# Patient Record
Sex: Female | Born: 1975
Health system: Southern US, Community
[De-identification: ages and names within clinical notes are randomized; demographics above are authoritative.]

## PROBLEM LIST (undated history)

## (undated) DIAGNOSIS — T8859XA Other complications of anesthesia, initial encounter: Secondary | ICD-10-CM

## (undated) DIAGNOSIS — T4145XA Adverse effect of unspecified anesthetic, initial encounter: Secondary | ICD-10-CM

## (undated) DIAGNOSIS — R109 Unspecified abdominal pain: Secondary | ICD-10-CM

## (undated) DIAGNOSIS — Z9889 Other specified postprocedural states: Secondary | ICD-10-CM

## (undated) DIAGNOSIS — R319 Hematuria, unspecified: Secondary | ICD-10-CM

## (undated) DIAGNOSIS — R519 Headache, unspecified: Secondary | ICD-10-CM

## (undated) DIAGNOSIS — F419 Anxiety disorder, unspecified: Secondary | ICD-10-CM

## (undated) DIAGNOSIS — R112 Nausea with vomiting, unspecified: Secondary | ICD-10-CM

## (undated) DIAGNOSIS — R51 Headache: Secondary | ICD-10-CM

## (undated) DIAGNOSIS — R6883 Chills (without fever): Secondary | ICD-10-CM

## (undated) DIAGNOSIS — H539 Unspecified visual disturbance: Secondary | ICD-10-CM

## (undated) DIAGNOSIS — R110 Nausea: Secondary | ICD-10-CM

## (undated) DIAGNOSIS — R002 Palpitations: Secondary | ICD-10-CM

## (undated) DIAGNOSIS — D689 Coagulation defect, unspecified: Secondary | ICD-10-CM

## (undated) DIAGNOSIS — K8020 Calculus of gallbladder without cholecystitis without obstruction: Secondary | ICD-10-CM

## (undated) DIAGNOSIS — D329 Benign neoplasm of meninges, unspecified: Secondary | ICD-10-CM

## (undated) DIAGNOSIS — R569 Unspecified convulsions: Secondary | ICD-10-CM

## (undated) DIAGNOSIS — F32A Depression, unspecified: Secondary | ICD-10-CM

## (undated) DIAGNOSIS — I749 Embolism and thrombosis of unspecified artery: Secondary | ICD-10-CM

## (undated) DIAGNOSIS — R634 Abnormal weight loss: Secondary | ICD-10-CM

## (undated) DIAGNOSIS — F329 Major depressive disorder, single episode, unspecified: Secondary | ICD-10-CM

## (undated) DIAGNOSIS — G459 Transient cerebral ischemic attack, unspecified: Secondary | ICD-10-CM

## (undated) DIAGNOSIS — K625 Hemorrhage of anus and rectum: Secondary | ICD-10-CM

## (undated) DIAGNOSIS — D496 Neoplasm of unspecified behavior of brain: Secondary | ICD-10-CM

## (undated) DIAGNOSIS — R410 Disorientation, unspecified: Secondary | ICD-10-CM

## (undated) DIAGNOSIS — K6289 Other specified diseases of anus and rectum: Secondary | ICD-10-CM

## (undated) DIAGNOSIS — J45909 Unspecified asthma, uncomplicated: Secondary | ICD-10-CM

## (undated) DIAGNOSIS — R531 Weakness: Secondary | ICD-10-CM

## (undated) HISTORY — DX: Hematuria, unspecified: R31.9

## (undated) HISTORY — DX: Chills (without fever): R68.83

## (undated) HISTORY — DX: Benign neoplasm of meninges, unspecified: D32.9

## (undated) HISTORY — DX: Abnormal weight loss: R63.4

## (undated) HISTORY — DX: Unspecified asthma, uncomplicated: J45.909

## (undated) HISTORY — DX: Unspecified abdominal pain: R10.9

## (undated) HISTORY — DX: Nausea: R11.0

## (undated) HISTORY — DX: Calculus of gallbladder without cholecystitis without obstruction: K80.20

## (undated) HISTORY — PX: BRAIN SURGERY: SHX531

## (undated) HISTORY — DX: Coagulation defect, unspecified: D68.9

## (undated) HISTORY — DX: Palpitations: R00.2

## (undated) HISTORY — DX: Weakness: R53.1

## (undated) HISTORY — DX: Hemorrhage of anus and rectum: K62.5

## (undated) HISTORY — DX: Unspecified visual disturbance: H53.9

## (undated) HISTORY — DX: Other specified diseases of anus and rectum: K62.89

## (undated) HISTORY — DX: Disorientation, unspecified: R41.0

---

## 2001-04-07 ENCOUNTER — Inpatient Hospital Stay (HOSPITAL_COMMUNITY): Admission: EM | Admit: 2001-04-07 | Discharge: 2001-04-08 | Payer: Self-pay | Admitting: Podiatry

## 2001-04-07 ENCOUNTER — Encounter: Payer: Self-pay | Admitting: *Deleted

## 2006-08-12 ENCOUNTER — Emergency Department (HOSPITAL_COMMUNITY): Admission: EM | Admit: 2006-08-12 | Discharge: 2006-08-12 | Payer: Self-pay | Admitting: Emergency Medicine

## 2006-08-24 ENCOUNTER — Emergency Department (HOSPITAL_COMMUNITY): Admission: EM | Admit: 2006-08-24 | Discharge: 2006-08-24 | Payer: Self-pay | Admitting: Emergency Medicine

## 2008-03-08 ENCOUNTER — Emergency Department (HOSPITAL_COMMUNITY): Admission: EM | Admit: 2008-03-08 | Discharge: 2008-03-09 | Payer: Self-pay | Admitting: Emergency Medicine

## 2008-03-12 HISTORY — PX: OTHER SURGICAL HISTORY: SHX169

## 2008-03-18 ENCOUNTER — Ambulatory Visit: Admission: RE | Admit: 2008-03-18 | Discharge: 2008-03-18 | Payer: Self-pay | Admitting: Gynecologic Oncology

## 2008-03-18 ENCOUNTER — Encounter: Payer: Self-pay | Admitting: Gynecologic Oncology

## 2008-03-18 ENCOUNTER — Ambulatory Visit (HOSPITAL_COMMUNITY): Admission: RE | Admit: 2008-03-18 | Discharge: 2008-03-18 | Payer: Self-pay | Admitting: Gynecologic Oncology

## 2008-03-18 ENCOUNTER — Other Ambulatory Visit: Admission: RE | Admit: 2008-03-18 | Discharge: 2008-03-18 | Payer: Self-pay | Admitting: Gynecologic Oncology

## 2008-03-30 ENCOUNTER — Encounter: Payer: Self-pay | Admitting: Gynecologic Oncology

## 2008-03-30 ENCOUNTER — Inpatient Hospital Stay (HOSPITAL_COMMUNITY): Admission: RE | Admit: 2008-03-30 | Discharge: 2008-04-01 | Payer: Self-pay | Admitting: Obstetrics & Gynecology

## 2009-03-12 DIAGNOSIS — D496 Neoplasm of unspecified behavior of brain: Secondary | ICD-10-CM

## 2009-03-12 HISTORY — DX: Neoplasm of unspecified behavior of brain: D49.6

## 2009-03-12 HISTORY — PX: OTHER SURGICAL HISTORY: SHX169

## 2009-03-15 ENCOUNTER — Emergency Department (HOSPITAL_COMMUNITY): Admission: EM | Admit: 2009-03-15 | Discharge: 2009-03-15 | Payer: Self-pay | Admitting: Emergency Medicine

## 2009-03-25 ENCOUNTER — Ambulatory Visit (HOSPITAL_COMMUNITY): Admission: RE | Admit: 2009-03-25 | Discharge: 2009-03-25 | Payer: Self-pay | Admitting: Neurosurgery

## 2009-03-31 ENCOUNTER — Inpatient Hospital Stay (HOSPITAL_COMMUNITY): Admission: RE | Admit: 2009-03-31 | Discharge: 2009-04-04 | Payer: Self-pay | Admitting: Neurosurgery

## 2009-03-31 ENCOUNTER — Encounter (INDEPENDENT_AMBULATORY_CARE_PROVIDER_SITE_OTHER): Payer: Self-pay | Admitting: Neurosurgery

## 2009-04-10 ENCOUNTER — Emergency Department (HOSPITAL_COMMUNITY): Admission: EM | Admit: 2009-04-10 | Discharge: 2009-04-11 | Payer: Self-pay | Admitting: Emergency Medicine

## 2009-07-13 ENCOUNTER — Ambulatory Visit (HOSPITAL_COMMUNITY): Admission: RE | Admit: 2009-07-13 | Discharge: 2009-07-13 | Payer: Self-pay | Admitting: Neurosurgery

## 2010-05-28 LAB — BASIC METABOLIC PANEL
BUN: 2 mg/dL — ABNORMAL LOW (ref 6–23)
BUN: 6 mg/dL (ref 6–23)
CO2: 24 mEq/L (ref 19–32)
CO2: 18 mEq/L — ABNORMAL LOW (ref 19–32)
CO2: 22 mEq/L (ref 19–32)
Calcium: 7.9 mg/dL — ABNORMAL LOW (ref 8.4–10.5)
Calcium: 8.1 mg/dL — ABNORMAL LOW (ref 8.4–10.5)
Chloride: 105 mEq/L (ref 96–112)
Chloride: 106 mEq/L (ref 96–112)
Chloride: 117 mEq/L — ABNORMAL HIGH (ref 96–112)
Creatinine, Ser: 0.51 mg/dL (ref 0.4–1.2)
Creatinine, Ser: 0.58 mg/dL (ref 0.4–1.2)
GFR calc non Af Amer: >60 mL/min (ref >60)
GFR calc non Af Amer: >60 mL/min (ref >60)
Glucose, Bld: 97 mg/dL (ref 70–99)
Glucose, Bld: 100 mg/dL — ABNORMAL HIGH (ref 70–99)
Glucose, Bld: 118 mg/dL — ABNORMAL HIGH (ref 70–99)
Potassium: 3.5 mEq/L (ref 3.5–5.1)
Potassium: 3.5 mEq/L (ref 3.5–5.1)
Potassium: 3.9 mEq/L (ref 3.5–5.1)
Sodium: 136 mEq/L (ref 135–145)
Sodium: 135 mEq/L (ref 135–145)
Sodium: 140 mEq/L (ref 135–145)

## 2010-05-28 LAB — CBC
HCT: 36.7 % (ref 36.0–46.0)
HCT: 40.7 % (ref 36.0–46.0)
Hemoglobin: 12.5 g/dL (ref 12.0–15.0)
Hemoglobin: 13.9 g/dL (ref 12.0–15.0)
MCHC: 34.1 g/dL (ref 30.0–36.0)
MCHC: 34.1 g/dL (ref 30.0–36.0)
MCV: 85.6 fL (ref 78.0–100.0)
MCV: 85.8 fL (ref 78.0–100.0)
Platelets: 277 10*3/uL (ref 150–400)
RBC: 4.29 MIL/uL (ref 3.87–5.11)
RBC: 4.74 MIL/uL (ref 3.87–5.11)
RDW: 14 % (ref 11.5–15.5)
WBC: 8.5 10*3/uL (ref 4.0–10.5)

## 2010-05-28 LAB — URINALYSIS, ROUTINE W REFLEX MICROSCOPIC
Nitrite: NEGATIVE
Specific Gravity, Urine: 1.027 (ref 1.005–1.030)
pH: 7 (ref 5.0–8.0)

## 2010-05-28 LAB — DIFFERENTIAL
Basophils Relative: 0 % (ref 0–1)
Eosinophils Absolute: 0.1 10*3/uL (ref 0.0–0.7)
Eosinophils Relative: 1 % (ref 0–5)
Monocytes Absolute: 0.5 10*3/uL (ref 0.1–1.0)
Monocytes Relative: 7 % (ref 3–12)
Neutro Abs: 5.7 10*3/uL (ref 1.7–7.7)

## 2010-05-28 LAB — TYPE AND SCREEN
ABO/RH(D): A POS
Antibody Screen: NEGATIVE

## 2010-05-28 LAB — PHENYTOIN LEVEL, TOTAL
Phenytoin Lvl: 13.8 ug/mL (ref 10.0–20.0)
Phenytoin Lvl: 9 ug/mL — ABNORMAL LOW (ref 10.0–20.0)
Phenytoin Lvl: 9.9 ug/mL — ABNORMAL LOW (ref 10.0–20.0)

## 2010-05-28 LAB — MRSA PCR SCREENING

## 2010-05-28 LAB — ABO/RH: ABO/RH(D): A POS

## 2010-06-26 LAB — DIFFERENTIAL
Eosinophils Absolute: 0.1 10*3/uL (ref 0.0–0.7)
Eosinophils Relative: 2 % (ref 0–5)
Lymphs Abs: 1.5 10*3/uL (ref 0.7–4.0)
Monocytes Absolute: 0.4 10*3/uL (ref 0.1–1.0)

## 2010-06-26 LAB — CBC
HCT: 30.8 % — ABNORMAL LOW (ref 36.0–46.0)
Hemoglobin: 10.5 g/dL — ABNORMAL LOW (ref 12.0–15.0)
RBC: 4.52 MIL/uL (ref 3.87–5.11)
RBC: 3.64 MIL/uL — ABNORMAL LOW (ref 3.87–5.11)
RDW: 14.1 % (ref 11.5–15.5)
WBC: 5.3 10*3/uL (ref 4.0–10.5)

## 2010-06-26 LAB — COMPREHENSIVE METABOLIC PANEL
ALT: 28 U/L (ref 0–35)
AST: 29 U/L (ref 0–37)
Alkaline Phosphatase: 44 U/L (ref 39–117)
CO2: 25 mEq/L (ref 19–32)
GFR calc non Af Amer: >60 mL/min (ref >60)
Glucose, Bld: 59 mg/dL — ABNORMAL LOW (ref 70–99)
Potassium: 3.9 mEq/L (ref 3.5–5.1)
Sodium: 139 mEq/L (ref 135–145)
Total Protein: 6 g/dL (ref 6.0–8.3)

## 2010-06-26 LAB — ABO/RH: ABO/RH(D): A POS

## 2010-06-26 LAB — PREGNANCY, URINE

## 2010-07-25 NOTE — Consult Note (Signed)
NAMEANTONIETA, [name] NO.:  [id number]   MEDICAL RECORD NO.:  [id number]          PATIENT TYPE:  OUT   LOCATION:  GYN                          FACILITY:  [facility]   PHYSICIAN:  Laurette Schimke, MD     DATE OF BIRTH:  May 10, 1975   DATE OF CONSULTATION:  03/18/2008  DATE OF DISCHARGE:                                 CONSULTATION   REASON FOR CONSULTATION:  Evaluation of pelvic mass.   HISTORY OF PRESENT ILLNESS:  This is a 35 year old gravida 1, para 1,  last menstrual period 03/08/2008, who presents with pelvic pain and  sensation of a hard mass within the pelvis.  She reports premenstrual  discomfort over the last year, which has worsened markedly over the past  few months.  She was seen in the emergency room on 03/09/2008 on day 2  of her menses with very severe abdominal pain and nausea.  A CT scan of  the abdomen and pelvis was obtained and was notable for a large cystic  and solid pelvic mass present inseparable from dorsal  surface of the  uterus.  The pelvic mass measured 12.6 x 10.4 x 12.6.  She was  subsequently seen by Dr. Billy Coast, the referring doctor, and the  ultrasound was performed and was notable for a mass measuring 9 x 12 x  11 cm, mainly hemorrhagic with some echo free  areas.  A CA-125 was  obtained and returned with a value of 130.7.  CEA was obtained and  returned a value of 0.6.  Of note, Melanie [name] states that she has  never been given a diagnosis of endometriosis or endometrioma, but  reports dyspareunia for approximately 2 years duration.   PAST MEDICAL HISTORY:  Asthma as a child with use of inhalers as a  teenager.  She denies any history of intubation or use of an inhaler as  an adult.   PAST SURGICAL HISTORY:  None.   PAST GYN HISTORY:  Menarche at the age of 8 with menses every month  lasting 3 days.  She has had 1 normal spontaneous vaginal delivery.   FAMILY HISTORY:  Notable for a half-sister diagnosed with cervical  cancer  at the age of 26.  She denies any colon or breast cancer.   SOCIAL HISTORY:  Melanie [name] is in a longstanding relationship.  She  is desirous of maintaining her fertility.  She has a 52 year old son who  is alive and well.  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  She denies any tobacco or IV  drug abuse.   REVIEW OF SYSTEMS:  Notable for cough of approximately 3 days duration  productive of yellow sputum and lower abdominal pain with occasional  nausea.   PHYSICAL EXAMINATION:  GENERAL:  Well-developed female in no acute  distress.  VITAL SIGNS:  Height 5 feet 1 inch, weight 167 pounds, blood pressure  138/84, pulse 72, pain 1/10.  CHEST:  Wheezes notable in the left lower lobe.  PELVIC EXAMINATION:  Notable for approximately a  14 cm fixed, tender  right-sided mass.  No nodularity is notable in the cul-de-sac.  The size  of the uterus is not identifiable on examination.  RECTAL:  Good sphincter tone without any masses.   IMPRESSION:  1. Pelvic mass, elevated CA-125.  Differential diagnosis is      endometriosis with associated endometrioma versus a neoplasm.  The      CT scan is notable for the presence of solid nodules within this      mass.  As such, an exploratory laparotomy has been recommended to      the patient with possible cystectomy versus unilateral salpingo-      oophorectomy.  She is desirous of staging and was informed that      based on histologic type and the visible evidence of extension of      disease, staging could be limited or could encompass hysterectomy      and bilateral salpingo-oophorectomy.  Risks of the procedure were      discussed with the patient and were inclusive of infection,      bleeding, damage to surrounding structures, prolonged surgery,      reoperation.  2. Productive cough.  A chest x-ray has been ordered.  She has been      placed on a Z-Pak.  3. Pain.  Oxycodone was prescribed  for pain control.      Laurette Schimke, MD  Electronically Signed     WB/MEDQ  D:  03/18/2008  T:  03/18/2008  Job:  [id number]   cc:   Telford Nab, R.N.  [address]  [place], [place] [address]   Lenoard Aden, M.D.  Fax: [phone]

## 2010-07-25 NOTE — Op Note (Signed)
Melanie [name], PAVLAK            ACCOUNT NO.:  [id number]   MEDICAL RECORD NO.:  [id number]          PATIENT TYPE:  INP   LOCATION:  0003                         FACILITY:  [facility]   PHYSICIAN:  Laurette Schimke, MD     DATE OF BIRTH:  [birth year]   DATE OF PROCEDURE:  03/30/2008  DATE OF DISCHARGE:                               OPERATIVE REPORT   PREOPERATIVE DIAGNOSIS:  Pelvic mass.   POSTOPERATIVE DIAGNOSIS:  Right ovarian endometrioma.   OPERATION PERFORMED:  Exploratory laparotomy, right ovarian cystectomy.   SURGEON:  Laurette Schimke, MD   ANESTHESIA:  General endotracheal.   INDICATIONS FOR PROCEDURE:  This is a 35 year old who presented with a  13 cm pelvic mass, notable for a severe amount of pain and discomfort.  The patient was counseled regarding the risks and benefits of surgery  and indicated that she still wanted to preserve her childbearing.  She  was counseled regarding making the procedure be cystectomy versus  unilateral salpingo-oophorectomy versus surgical staging in the event of  malignancy.  She consented.   FINDINGS:  Upon entry into the abdomen, a 15 cm ovarian endometrioma was  appreciated.  There was no gross disease on the other intra-abdominal  and intrapelvic structures.  There was evidence of excrescences on the  surface of the mass and this was removed with cautery.  A cystectomy was  easily performed with removal or the entire cyst wall.  There was normal  appendix, normal kidneys bilaterally, normal gallbladder, liver,  pancreas.   DESCRIPTION OF PROCEDURE:  The patient was taken to the operating room  and placed under general endotracheal anesthesia without any difficulty.  An 8 cm vertical incision was made below the umbilicus with a scalpel  and the abdomen entered.  Pelvic washings were obtained.  The upper  abdomen and pelvis were explored and the findings were as noted above.  Laps were placed within the abdomen and a pursestring suture was  placed  in the ovary and the ovarian endometrioma drained of 700 mL of  endometriotic chocolate fluid.  An incision was then made in the cyst  and the cyst wall very easily removed.  Some powder burns were noted on  the external surface of the ovarian capsule and these were ablated using  cautery.  The pelvis was copiously irrigated and drained.  The appendix  was identified and was normal.  The contralateral ovary was evaluated  and was within normal limits.  The laps were removed.  The abdomen again  irrigated and drained.  The fascia was then closed with a 0 looped PDS  suture in a Smead Jones fashion.  Subcutaneous tissues were irrigated  and the subcutaneous tissues approximated with interrupted Vicryl  sutures.  The skin was closed with a subcuticular suture.  Benzoin was  placed over the skin.  Of note, 10 mL of Marcaine were injected into  incision.  Steri-Strips were placed over the incision.  The estimated  blood loss was 50 mL.  Sponge, instrument and needle counts were correct  x3.   COMPLICATIONS:  None.   DRAINS:  Foley draining  clear urine.   SPECIMENS:  Right ovarian endometriotic cyst.   The patient was transferred to the PACU in good condition.      Laurette Schimke, MD  Electronically Signed     WB/MEDQ  D:  03/30/2008  T:  03/30/2008  Job:  [id number]   cc:   Telford Nab, R.N.  [address]  [place], [place] [address]   Lenoard Aden, M.D.  Fax: [phone]   Roseanna Rainbow, M.D.  Fax: [phone]

## 2010-12-05 ENCOUNTER — Other Ambulatory Visit (HOSPITAL_COMMUNITY): Payer: Self-pay | Admitting: Neurosurgery

## 2010-12-05 DIAGNOSIS — D429 Neoplasm of uncertain behavior of meninges, unspecified: Secondary | ICD-10-CM

## 2010-12-14 LAB — CBC
Hemoglobin: 12.9 g/dL (ref 12.0–15.0)
MCHC: 33.6 g/dL (ref 30.0–36.0)
RBC: 4.61 MIL/uL (ref 3.87–5.11)

## 2010-12-14 LAB — URINALYSIS, ROUTINE W REFLEX MICROSCOPIC
Bilirubin Urine: NEGATIVE
Glucose, UA: NEGATIVE mg/dL
Ketones, ur: NEGATIVE mg/dL
Protein, ur: NEGATIVE mg/dL
pH: 5.5 (ref 5.0–8.0)

## 2010-12-14 LAB — DIFFERENTIAL
Basophils Relative: 1 % (ref 0–1)
Eosinophils Absolute: 0.2 10*3/uL (ref 0.0–0.7)
Eosinophils Relative: 3 % (ref 0–5)
Lymphs Abs: 1.3 10*3/uL (ref 0.7–4.0)
Monocytes Relative: 7 % (ref 3–12)

## 2010-12-14 LAB — COMPREHENSIVE METABOLIC PANEL
ALT: 30 U/L (ref 0–35)
AST: 24 U/L (ref 0–37)
Alkaline Phosphatase: 60 U/L (ref 39–117)
CO2: 26 mEq/L (ref 19–32)
Calcium: 9 mg/dL (ref 8.4–10.5)
GFR calc Af Amer: >60 mL/min (ref >60)
Potassium: 3.8 mEq/L (ref 3.5–5.1)
Sodium: 140 mEq/L (ref 135–145)
Total Protein: 6.2 g/dL (ref 6.0–8.3)

## 2010-12-14 LAB — URINE MICROSCOPIC-ADD ON

## 2010-12-14 LAB — URINE CULTURE

## 2010-12-14 LAB — WET PREP, GENITAL
Trich, Wet Prep: NONE SEEN
Yeast Wet Prep HPF POC: NONE SEEN

## 2010-12-14 LAB — POCT PREGNANCY, URINE: Preg Test, Ur: NEGATIVE

## 2010-12-26 ENCOUNTER — Inpatient Hospital Stay (HOSPITAL_COMMUNITY): Admission: RE | Admit: 2010-12-26 | Payer: Self-pay | Source: Ambulatory Visit

## 2010-12-28 LAB — COMPREHENSIVE METABOLIC PANEL
ALT: 16
BUN: 8
CO2: 26
Calcium: 8.8
Creatinine, Ser: 0.64
GFR calc non Af Amer: >60
Glucose, Bld: 100 — ABNORMAL HIGH
Sodium: 140

## 2010-12-28 LAB — CBC
HCT: 39.6
Hemoglobin: 13.4
MCHC: 34
MCV: 84.3
Platelets: 254
RBC: 4.69
RDW: 14
WBC: 5.9

## 2010-12-28 LAB — COMPREHENSIVE METABOLIC PANEL WITH GFR
AST: 24
Albumin: 3.6
Alkaline Phosphatase: 45
Chloride: 107
GFR calc Af Amer: >60
Potassium: 3.9
Total Bilirubin: 0.7
Total Protein: 6.5

## 2010-12-28 LAB — DIFFERENTIAL
Basophils Absolute: 0
Basophils Relative: 0
Eosinophils Absolute: 0
Eosinophils Relative: 1
Lymphocytes Relative: 13
Lymphs Abs: 0.8
Monocytes Absolute: 0.2
Monocytes Relative: 4
Neutro Abs: 4.9
Neutrophils Relative %: 82 — ABNORMAL HIGH

## 2011-01-16 ENCOUNTER — Ambulatory Visit (HOSPITAL_COMMUNITY)
Admission: RE | Admit: 2011-01-16 | Discharge: 2011-01-16 | Disposition: A | Discharge status: Home / Self Care | Payer: Medicaid Other | Source: Ambulatory Visit | Attending: Neurosurgery | Admitting: Neurosurgery

## 2011-01-16 DIAGNOSIS — D429 Neoplasm of uncertain behavior of meninges, unspecified: Secondary | ICD-10-CM

## 2011-01-16 DIAGNOSIS — Z87898 Personal history of other specified conditions: Secondary | ICD-10-CM | POA: Insufficient documentation

## 2011-01-16 MED ORDER — GADOBENATE DIMEGLUMINE 529 MG/ML IV SOLN
15.0000 mL | Freq: Once | INTRAVENOUS | Status: AC
  Administered 2011-01-16: 15 mL via INTRAVENOUS

## 2011-07-06 ENCOUNTER — Emergency Department (HOSPITAL_COMMUNITY)
Admission: EM | Admit: 2011-07-06 | Discharge: 2011-07-06 | Disposition: A | Discharge status: Home / Self Care | Payer: Self-pay | Attending: Emergency Medicine | Admitting: Emergency Medicine

## 2011-07-06 ENCOUNTER — Emergency Department (HOSPITAL_COMMUNITY): Payer: Self-pay

## 2011-07-06 ENCOUNTER — Encounter (HOSPITAL_COMMUNITY): Payer: Self-pay

## 2011-07-06 DIAGNOSIS — Z8679 Personal history of other diseases of the circulatory system: Secondary | ICD-10-CM | POA: Insufficient documentation

## 2011-07-06 DIAGNOSIS — R4781 Slurred speech: Secondary | ICD-10-CM

## 2011-07-06 DIAGNOSIS — R209 Unspecified disturbances of skin sensation: Secondary | ICD-10-CM | POA: Insufficient documentation

## 2011-07-06 DIAGNOSIS — R42 Dizziness and giddiness: Secondary | ICD-10-CM | POA: Insufficient documentation

## 2011-07-06 DIAGNOSIS — R4789 Other speech disturbances: Secondary | ICD-10-CM | POA: Insufficient documentation

## 2011-07-06 DIAGNOSIS — R202 Paresthesia of skin: Secondary | ICD-10-CM

## 2011-07-06 DIAGNOSIS — R51 Headache: Secondary | ICD-10-CM | POA: Insufficient documentation

## 2011-07-06 HISTORY — DX: Unspecified convulsions: R56.9

## 2011-07-06 HISTORY — DX: Neoplasm of unspecified behavior of brain: D49.6

## 2011-07-06 HISTORY — DX: Embolism and thrombosis of unspecified artery: I74.9

## 2011-07-06 HISTORY — DX: Transient cerebral ischemic attack, unspecified: G45.9

## 2011-07-06 LAB — BASIC METABOLIC PANEL
BUN: 8 mg/dL (ref 6–23)
Chloride: 104 mEq/L (ref 96–112)
GFR calc Af Amer: >90 mL/min (ref >90)
GFR calc non Af Amer: >90 mL/min (ref >90)
Potassium: 3.2 mEq/L — ABNORMAL LOW (ref 3.5–5.1)
Sodium: 136 mEq/L (ref 135–145)

## 2011-07-06 LAB — CBC
Hemoglobin: 12.7 g/dL (ref 12.0–15.0)
MCH: 27.5 pg (ref 26.0–34.0)
MCHC: 34.3 g/dL (ref 30.0–36.0)
Platelets: 257 10*3/uL (ref 150–400)
RDW: 13.2 % (ref 11.5–15.5)

## 2011-07-06 LAB — DIFFERENTIAL
Basophils Absolute: 0 10*3/uL (ref 0.0–0.1)
Basophils Relative: 0 % (ref 0–1)
Eosinophils Absolute: 0 10*3/uL (ref 0.0–0.7)
Neutro Abs: 9.8 10*3/uL — ABNORMAL HIGH (ref 1.7–7.7)
Neutrophils Relative %: 82 % — ABNORMAL HIGH (ref 43–77)

## 2011-07-06 LAB — RAPID STREP SCREEN (MED CTR MEBANE ONLY): Streptococcus, Group A Screen (Direct): NEGATIVE

## 2011-07-06 MED ORDER — SODIUM CHLORIDE 0.9 % IV BOLUS (SEPSIS)
1000.0000 mL | Freq: Once | INTRAVENOUS | Status: AC
  Administered 2011-07-06: 1000 mL via INTRAVENOUS

## 2011-07-06 MED ORDER — ASPIRIN 325 MG PO TABS
ORAL_TABLET | ORAL | Status: AC
  Filled 2011-07-06: qty 1

## 2011-07-06 MED ORDER — METOCLOPRAMIDE HCL 5 MG/ML IJ SOLN
10.0000 mg | Freq: Once | INTRAMUSCULAR | Status: AC
  Administered 2011-07-06: 10 mg via INTRAVENOUS
  Filled 2011-07-06: qty 2

## 2011-07-06 MED ORDER — ASPIRIN 325 MG PO TABS: 325.0000 mg | ORAL_TABLET | Freq: Once | ORAL | Status: DC

## 2011-07-06 MED ORDER — DEXAMETHASONE SODIUM PHOSPHATE 10 MG/ML IJ SOLN
10.0000 mg | Freq: Once | INTRAMUSCULAR | Status: AC
  Administered 2011-07-06: 10 mg via INTRAVENOUS
  Filled 2011-07-06: qty 1

## 2011-07-06 MED ORDER — ASPIRIN 81 MG PO TABS: 325.0000 mg | ORAL_TABLET | Freq: Once | ORAL | Status: DC

## 2011-07-06 MED ORDER — DIPHENHYDRAMINE HCL 50 MG/ML IJ SOLN
25.0000 mg | Freq: Once | INTRAMUSCULAR | Status: AC
  Administered 2011-07-06: 25 mg via INTRAVENOUS
  Filled 2011-07-06: qty 1

## 2011-07-06 NOTE — ED Notes (Signed)
DR. Patria Mane NOTIFIED THAT PT. WANTS TO GO HOME / REFUSED ASA.

## 2011-07-06 NOTE — ED Notes (Signed)
Per EMS, pt has a history of a brain tumor which was removed 2 years ago. Pt had been having seizures and TIA before that. C/o numbness and tingling to her right arm, pain to left eye, ear and throat. Currently has a migraine with hx of. Pt c/o dizziness since about 1700 and states she had a TIA then. Her vision was blacking out. 20g LAC, CBG 104.

## 2011-07-06 NOTE — ED Notes (Signed)
RETURNED FROM CT SCAN , DENIES PAIN OR DISCOMFORT , STATES " I FEEL SO MUCH BETTER ".

## 2011-07-06 NOTE — Discharge Instructions (Signed)
RESOURCE GUIDE  Dental Problems  Patients with Medicaid: [facility]                     [facility] [address]                                           [address] Phone:  [phone]                                                  Phone:  [phone]  If unable to pay or uninsured, contact:  [facility] or [facility] to become qualified for the adult dental clinic.  Chronic Pain Problems Contact [facility]  [phone] Patients need to be referred by their primary care doctor.  Insufficient Money for Medicine Contact [employer]:  call "211" or [facility] 271-5999.  No Primary Care Doctor Call [facility]  832-8000 Other agencies that provide inexpensive medical care    [facility]  [phone]    [facility]  [phone]    [facility]  [phone]    Women's Clinic  832-4777    [facility]  [phone]    [facility]  [phone]  Psychological Services [facility]  [phone] [facility]  [phone] [facility]   800 853-5163 (emergency services 641-4993)  Substance Abuse Resources [facility]  336-882-2125 [facility] 336-784-9470 [facility] 336-285-9073 [facility] 336-845-3988 Residential & Outpatient Substance Abuse Program  800-659-3381  Abuse/Neglect [place] Child Abuse Hotline (336) 641-3795 [place] [facility] Abuse Hotline 800-378-5315 (After Hours)  Emergency Shelter [facility] (336) 271-5985  Maternity Homes [facility] (336) 275-9566 [facility] (704) 372-4663  MRSA Hotline #:   832-7006    [place] [facility]  [facility] [place] [facility]     [employer]                          [facility] [address] [place]                       [address]      [address]  [place]                                                [place]                            [place] Phone:  [phone]                                   Phone:  [phone]                 Phone:  [phone]  [facility] Phone:  [phone]    [facility] Child Abuse Hotline 7803271475 231-583-6890 (After Hours)  Transient Ischemic Attack A transient ischemic attack (TIA) is a "warning stroke" that causes stroke-like symptoms. Unlike a stroke, a TIA does not cause permanent damage to the brain. The symptoms of a TIA can happen very fast and do not last long. It is important to know the symptoms of a TIA and what to do. This can help prevent a major stroke or death. CAUSES   A TIA is caused by a temporary blockage in an artery in the brain or neck (carotid artery). The blockage does not allow the brain to get the blood supply it needs and can cause different symptoms. The blockage can be caused by either:   A blood clot.   Fatty buildup (plaque) in a neck or brain artery.  SYMPTOMS  TIA symptoms are the same as a stroke but are temporary. Symptoms can include sudden:  Numbness or weakness on one side of the body. Especially to the:   Face.   Arm.   Leg.   Trouble speaking, thinking, or confusion.   Change in vision, such as trouble seeing in one or both eyes.   Dizziness, loss of balance, or difficulty walking.   Severe headache.  ANY OF THESE SYMPTOMS MAY REPRESENT A SERIOUS PROBLEM THAT IS AN EMERGENCY. Do not wait to see if the symptoms will go away. Get medical help at once. Call your local emergency services (911 in [place] IMMEDIATELY. DO NOT drive yourself to the hospital. RISK FACTORS Risk factors can increase the risk of developing a TIA. These can include.   High blood pressure (hypertension).   High cholesterol (hyperlipidemia).   Heart disease (atherosclerosis).   Smoking.   Diabetes.   Abnormal heart  rhythm (atrial fibrillation).   Family history of a stroke or heart attack.   Use of oral contraceptives (especially when combined with smoking).  DIAGNOSIS   A TIA can be diagnosed based on your:   Symptoms.   History.   Risk factors.   Tests that can help diagnose the symptoms of a TIA include:   CT or MRI scan. These tests can provide detailed images of the brain.   Carotid ultrasound. This test looks to see if there are blockages in the carotid arteries of your neck.   Arteriography. A thin, small flexible tube (catheter) is inserted through a small cut (incision) in your groin. The catheter is threaded to your carotid or vertebral artery. A dye is then injected into the catheter. The dye highlights the arteries in your brain and allows your caregiver to look for narrowing or blockages that can cause a TIA.  TREATMENT  Based on the cause of a TIA, treatment options can vary. Treatment is important to help prevent a stroke. Treatment options can include:  Medication. Such as:   Clot-busting medicine.   Anti-platelet medicine.   Blood pressure medicine.   Blood thinner medicine.   Surgery:   Carotid endarterectomy. The carotid arteries are the arteries that supply the head and neck with oxygenated blood. This surgery can help remove fatty deposits (plaque) in the carotid arteries.   Angioplasty and stenting. This surgery uses a balloon to dilate a blocked artery in the brain. A stent is a small, metal mesh tube that can help keep an artery open  HOME CARE INSTRUCTIONS   It is important to take all medicine as told by your caregiver. If the medicine has side effects that affect you negatively,  tell your caregiver right away. Do not stop taking medicine unless told by your caregiver. Some medicines may need to be changed to better treat your condition.   Do not smoke. Talk to your caregiver on how to quit smoking.   Eat a diet high in fruits, vegetables and lean meat.  Avoid a high fat, high salt diet. A dietician can you help you make healthy food choices.   Maintain a healthy weight. Develop an exercise plan approved by your caregiver.  SEEK IMMEDIATE MEDICAL CARE IF:   You develop weakness or numbness on one side of your body.   You have problems thinking, speaking, or feel confused.   You have vision changes.   You feel dizzy, have trouble walking, or lose your balance.   You develop a severe headache.  MAKE SURE YOU:   Understand these instructions.   Will watch your condition.   Will get help right away if you are not doing well or get worse.  Document Released: 12/06/2004 Document Revised: 02/15/2011 Document Reviewed: 04/21/2009 [facility] Patient Information 2012 [place], [place].Stroke Prevention Some medical conditions and behaviors are associated with an increased chance of having a stroke. You may prevent a stroke by making healthy choices and managing medical conditions. Reduce your risk of having a stroke by:  Staying physically active. Get at least 30 minutes of activity on most or all days.   Not smoking. It may also be helpful to avoid exposure to secondhand smoke.   Limiting alcohol use. Moderate alcohol use is considered to be:   No more than 2 drinks per day for men.   No more than 1 drink per day for nonpregnant women.   Eating healthy foods.   Include 5 or more servings of fruits and vegetables a day.   Certain diets may be prescribed to address high blood pressure, high cholesterol, diabetes, or obesity.   Managing your cholesterol levels.   A low-saturated fat, low-trans fat, low-cholesterol, and high-fiber diet may control cholesterol levels.   Take any prescribed medicines to control cholesterol as directed by your caregiver.   Managing your diabetes.   A controlled-carbohydrate, controlled-sugar diet is recommended to manage diabetes.   Take any prescribed medicines to control diabetes as directed by your  caregiver.   Controlling your high blood pressure (hypertension).   A low-salt (sodium), low-saturated fat, low-trans fat, and low-cholesterol diet is recommended to manage high blood pressure.   Take any prescribed medicines to control hypertension as directed by your caregiver.   Maintaining a healthy weight.   A reduced-calorie, low-sodium, low-saturated fat, low-trans fat, low-cholesterol diet is recommended to manage weight.   Stopping drug abuse.   Avoiding birth control pills.   Talk to your caregiver about the risks of taking birth control pills if you are over 56 years old, smoke, get migraines, or have ever had a blood clot.   Getting evaluated for sleep disorders (sleep apnea).   Talk to your caregiver about getting a sleep evaluation if you snore a lot or have excessive sleepiness.   Taking medicines as directed by your caregiver.   For some people, aspirin or blood thinners (anticoagulants) are helpful in reducing the risk of forming abnormal blood clots that can lead to stroke. If you have the irregular heart rhythm of atrial fibrillation, you should be on a blood thinner unless there is a good reason you cannot take them.   Understand all your medicine instructions.  SEEK IMMEDIATE MEDICAL CARE IF:  You have sudden weakness or numbness of the face, arm, or leg, especially on one side of the body.   You have sudden confusion.   You have trouble speaking (aphasia) or understanding.   You have sudden trouble seeing in one or both eyes.   You have sudden trouble walking.   You have dizziness.   You have a loss of balance or coordination.   You have a sudden, severe headache with no known cause.   You have new chest pain or an irregular heartbeat.  Any of these symptoms may represent a serious problem that is an emergency. Do not wait to see if the symptoms will go away. Get medical help right away. Call your local emergency services (911 in [place] Do not  drive yourself to the hospital. Document Released: 04/05/2004 Document Revised: 02/15/2011 Document Reviewed: 10/16/2010 [facility] Patient Information 2012 [place], [place].Migraine Headache A migraine headache is an intense, throbbing pain on one or both sides of your head. The exact cause of a migraine headache is not always known. A migraine may be caused when nerves in the brain become irritated and release chemicals that cause swelling within blood vessels, causing pain. Many migraine sufferers have a family history of migraines. Before you get a migraine you may or may not get an aura. An aura is a group of symptoms that can predict the beginning of a migraine. An aura may include:  Visual changes such as:   Flashing lights.   Bright spots or zig-zag lines.   Tunnel vision.   Feelings of numbness.   Trouble talking.   Muscle weakness.  SYMPTOMS  Pain on one or both sides of your head.   Pain that is pulsating or throbbing in nature.   Pain that is severe enough to prevent daily activities.   Pain that is aggravated by any daily physical activity.   Nausea (feeling sick to your stomach), vomiting, or both.   Pain with exposure to bright lights, loud noises, or activity.   General sensitivity to bright lights or loud noises.  MIGRAINE TRIGGERS Examples of triggers of migraine headaches include:   Alcohol.   Smoking.   Stress.   It may be related to menses (female menstruation).   Aged cheeses.   Foods or drinks that contain nitrates, glutamate, aspartame, or tyramine.   Lack of sleep.   Chocolate.   Caffeine.   Hunger.   Medications such as nitroglycerine (used to treat chest pain), birth control pills, estrogen, and some blood pressure medications.  DIAGNOSIS  A migraine headache is often diagnosed based on:  Symptoms.   Physical examination.   A computerized X-ray scan (computed tomography, CT) of your head.  TREATMENT  Medications can help  prevent migraines if they are recurrent or should they become recurrent. Your caregiver can help you with a medication or treatment program that will be helpful to you.   Lying down in a dark, quiet room may be helpful.   Keeping a headache diary may help you find a trend as to what may be triggering your headaches.  SEEK IMMEDIATE MEDICAL CARE IF:   You have confusion, personality changes or seizures.   You have headaches that wake you from sleep.   You have an increased frequency in your headaches.   You have a stiff neck.   You have a loss of vision.   You have muscle weakness.   You start losing your balance or have trouble walking.   You feel  faint or pass out.  MAKE SURE YOU:   Understand these instructions.   Will watch your condition.   Will get help right away if you are not doing well or get worse.  Document Released: 02/26/2005 Document Revised: 02/15/2011 Document Reviewed: 10/12/2008 [facility] Patient Information 2012 [place], [place].

## 2011-07-06 NOTE — ED Notes (Signed)
PT. PASSED SWALLOWING EVALUATION .

## 2011-07-06 NOTE — ED Provider Notes (Signed)
History     CSN: [id number]  Arrival date & time 07/06/11  1826   First MD Initiated Contact with Patient 07/06/11 1948      Chief Complaint  Patient presents with  . Dizziness    Location-head/No radiation/quality-dull/duration-several hours/timing-constant/severity-mild/associated sxs-see ROS/No recent treatment) Patient is a 36 y.o. female presenting with neurologic complaint. The history is provided by the patient. No language interpreter was used.  Neurologic Problem The primary symptoms include headaches, paresthesias and speech change. Primary symptoms do not include syncope, loss of consciousness, altered mental status, seizures, dizziness, visual change, focal weakness, loss of sensation, memory loss, fever, nausea or vomiting. The symptoms began 6 to 12 hours ago. The episode lasted 15 minutes. The symptoms are resolved. The neurological symptoms are focal.  The headache is associated with photophobia and paresthesias. The headache is not associated with eye pain, visual change, neck stiffness, weakness or loss of balance.  Additional symptoms include photophobia. Additional symptoms do not include neck stiffness, weakness, pain, lower back pain, leg pain, loss of balance, hearing loss, tinnitus, vertigo or anxiety. Medical issues do not include seizures. Associated medical issues comments: Pt reports a TIA hx. Workup history includes MRI.    Past Medical History  Diagnosis Date  . TIA (transient ischemic attack)   . Seizures   . Brain tumor   . Embolism - blood clot in left ovary    Past Surgical History  Procedure Date  . Brain tumor removed     History reviewed. No pertinent family history.  History  Substance Use Topics  . Smoking status: Not on file  . Smokeless tobacco: Not on file  . Alcohol Use: No    OB History    Grav Para Term Preterm Abortions TAB SAB Ect Mult Living                  Review of Systems  Constitutional: Negative for fever and  chills.  HENT: Negative for hearing loss, trouble swallowing, neck pain, neck stiffness and tinnitus.   Eyes: Positive for photophobia. Negative for pain, discharge and itching.  Respiratory: Negative for cough, chest tightness and shortness of breath.   Cardiovascular: Negative for chest pain, palpitations, leg swelling and syncope.  Gastrointestinal: Negative for nausea, vomiting, abdominal pain, diarrhea, constipation and blood in stool.  Genitourinary: Negative for dysuria, urgency, frequency, hematuria, flank pain, decreased urine volume, difficulty urinating and pelvic pain.  Musculoskeletal: Negative for back pain and joint swelling.  Skin: Negative for rash and wound.  Neurological: Positive for speech change, speech difficulty, headaches and paresthesias. Negative for dizziness, vertigo, tremors, focal weakness, seizures, loss of consciousness, syncope, facial asymmetry, weakness, light-headedness, numbness and loss of balance.  Hematological: Negative for adenopathy. Does not bruise/bleed easily.  Psychiatric/Behavioral: Negative for memory loss, confusion, decreased concentration and altered mental status.    Allergies  Review of patient's allergies indicates no known allergies.  Home Medications   Current Outpatient Rx  Name Route Sig Dispense Refill  . IBUPROFEN 200 MG PO TABS Oral Take 400 mg by mouth every 6 (six) hours as needed. For pain    . OVER THE COUNTER MEDICATION Oral Take 1 capsule by mouth daily. Meltdown diet pill      BP 136/85  Pulse 104  Temp(Src) 98.1 F (36.7 C) (Oral)  Resp 16  SpO2 99%  Physical Exam  Constitutional: She is oriented to person, place, and time. She appears well-developed and well-nourished. No distress.  HENT:  Head: Normocephalic and  atraumatic.  Right Ear: Hearing, tympanic membrane, external ear and ear canal normal.  Left Ear: Hearing, tympanic membrane, external ear and ear canal normal.  Nose: Nose normal.  Mouth/Throat:  Uvula is midline and mucous membranes are normal. Posterior oropharyngeal erythema present. No oropharyngeal exudate or tonsillar abscesses.       Mild anterior cervical adenopathy.   Eyes: Conjunctivae and EOM are normal. Pupils are equal, round, and reactive to light. Right eye exhibits no discharge. Left eye exhibits no discharge. No scleral icterus.  Neck: Normal range of motion. Neck supple.  Cardiovascular: Normal rate, regular rhythm, normal heart sounds and intact distal pulses.   No murmur heard. Pulmonary/Chest: Effort normal and breath sounds normal. No respiratory distress.  Abdominal: Soft. Bowel sounds are normal. She exhibits no distension. There is no tenderness.  Musculoskeletal: Normal range of motion. She exhibits no tenderness.  Neurological: She is alert and oriented to person, place, and time. She has normal strength. No cranial nerve deficit or sensory deficit. Coordination normal. GCS eye subscore is 4. GCS verbal subscore is 5. GCS motor subscore is 6.  Skin: Skin is warm and dry. She is not diaphoretic.  Psychiatric: She has a normal mood and affect.    ED Course  Procedures (including critical care time)  Labs Reviewed  CBC - Abnormal; Notable for the following:    WBC 11.9 (*)    All other components within normal limits  DIFFERENTIAL - Abnormal; Notable for the following:    Neutrophils Relative 82 (*)    Neutro Abs 9.8 (*)    All other components within normal limits  BASIC METABOLIC PANEL - Abnormal; Notable for the following:    Potassium 3.2 (*)    Glucose, Bld 102 (*)    Calcium 8.1 (*)    All other components within normal limits  RAPID STREP SCREEN  STREP A DNA PROBE   Ct Head Wo Contrast  07/06/2011  *RADIOLOGY REPORT*  Clinical Data: Dizziness  CT HEAD WITHOUT CONTRAST  Technique:  Contiguous axial images were obtained from the base of the skull through the vertex without contrast.  Comparison: 01/16/2011  Findings: Previous left frontoparietal  craniotomy.  The brain has a normal appearance without evidence for hemorrhage, infarction, hydrocephalus, or mass lesion.  There is no extra axial fluid collection.  The skull and paranasal sinuses are normal.  IMPRESSION:  1.  Previous left frontoparietal craniotomy for resection of a meningioma. 2.  No acute intracranial abnormalities.  Original Report Authenticated By: Rosealee Albee, M.D.     1. Headache   2. Paresthesia of right upper limb   3. Slurred speech       Date: 07/07/2011  Rate: 83  Rhythm: normal sinus rhythm  QRS Axis: normal  Intervals: normal  ST/T Wave abnormalities: normal  Conduction Disutrbances:none  Narrative Interpretation:   Old EKG Reviewed: unchanged   MDM  Pt is a well appearing 36yo F with PMH of migraines and brain tumor resected 2 years ago as well as a reported TIA prior to the brain tumor who presents with "migraine HA" identical to all other migraines but pt had a 15 minute episode of R arm numbness and slurred speech around 1700 which is all resolved except for the HA. VSS. AF. NAD. No focal neuro deficits. Labs WNL. Pt also complained of sore throat. Rapid strep negative. Likely viral pharyngitis. Pt has had sick contacts recently with similar sx. HA resolved with migraine cocktail. CT head unremarkable. Pt  has no risk factors for vascular disease so I am less concerned about a TIA. Neuro sx possibly 2/2 complicated migraine. Pt was encouraged to stay to be admitted to hospital or stay overnight in CDU TIA protocol but pt refuses to stay so return precautions discussed and pt given PCP f/u. Pt also instructed to take ASA daily. Pt verbalized understanding. No recurrence of neuro sx while in ED.         Consuello Masse, MD 07/07/11 [phone]

## 2011-07-07 LAB — STREP A DNA PROBE
Group A Strep Probe: NEGATIVE
Special Requests: NORMAL

## 2011-07-07 NOTE — ED Provider Notes (Signed)
I saw and evaluated the patient, reviewed the resident's note and I agree with the findings and plan.  The patient's symptoms tonight are not consistent with a TIA nor stroke.  This may represent migraine variant/Compcare migraine.  The patient reports her headache is improving.  Neurologic symptoms are improving.  She walked to the bathroom without difficulty.  She has no obvious neuro deficit on exam at this time.  Finger to nose is normal bilaterally.  Father 5 strength in bilateral upper lower extremity major muscle groups.  DC home on aspirin.  PCP followup.  The patient was offered neurology followup and she refused  Lyanne Co, MD 07/07/11 [phone]

## 2011-07-23 ENCOUNTER — Emergency Department (HOSPITAL_COMMUNITY)
Admission: EM | Admit: 2011-07-23 | Discharge: 2011-07-23 | Discharge status: Against Medical Advice | Payer: Self-pay | Attending: Emergency Medicine | Admitting: Emergency Medicine

## 2011-07-23 ENCOUNTER — Encounter (HOSPITAL_COMMUNITY): Payer: Self-pay | Admitting: Emergency Medicine

## 2011-07-23 DIAGNOSIS — R209 Unspecified disturbances of skin sensation: Secondary | ICD-10-CM | POA: Insufficient documentation

## 2011-07-23 NOTE — ED Notes (Signed)
Dr  Ancil Linsey is her  Neurodr . States  Has not seen him in 6 months

## 2011-07-23 NOTE — ED Notes (Signed)
Was seen here on the 4/26 for same numbness still has not gone away she states left side is hand and foot  numb and left side of face vision is blurred

## 2011-07-25 ENCOUNTER — Emergency Department (HOSPITAL_COMMUNITY)
Admission: EM | Admit: 2011-07-25 | Discharge: 2011-07-25 | Disposition: A | Discharge status: Home / Self Care | Payer: Self-pay | Attending: Emergency Medicine | Admitting: Emergency Medicine

## 2011-07-25 ENCOUNTER — Encounter (HOSPITAL_COMMUNITY): Payer: Self-pay

## 2011-07-25 DIAGNOSIS — H539 Unspecified visual disturbance: Secondary | ICD-10-CM | POA: Insufficient documentation

## 2011-07-25 DIAGNOSIS — R209 Unspecified disturbances of skin sensation: Secondary | ICD-10-CM | POA: Insufficient documentation

## 2011-07-25 DIAGNOSIS — F411 Generalized anxiety disorder: Secondary | ICD-10-CM

## 2011-07-25 DIAGNOSIS — M436 Torticollis: Secondary | ICD-10-CM | POA: Insufficient documentation

## 2011-07-25 DIAGNOSIS — Z9889 Other specified postprocedural states: Secondary | ICD-10-CM | POA: Insufficient documentation

## 2011-07-25 DIAGNOSIS — R42 Dizziness and giddiness: Secondary | ICD-10-CM | POA: Insufficient documentation

## 2011-07-25 DIAGNOSIS — R202 Paresthesia of skin: Secondary | ICD-10-CM

## 2011-07-25 DIAGNOSIS — Z8673 Personal history of transient ischemic attack (TIA), and cerebral infarction without residual deficits: Secondary | ICD-10-CM | POA: Insufficient documentation

## 2011-07-25 DIAGNOSIS — D320 Benign neoplasm of cerebral meninges: Secondary | ICD-10-CM

## 2011-07-25 LAB — COMPREHENSIVE METABOLIC PANEL
ALT: 6 U/L (ref 0–35)
AST: 10 U/L (ref 0–37)
Albumin: 3.5 g/dL (ref 3.5–5.2)
Calcium: 9.1 mg/dL (ref 8.4–10.5)
Chloride: 105 mEq/L (ref 96–112)
Creatinine, Ser: 0.65 mg/dL (ref 0.50–1.10)
Sodium: 138 mEq/L (ref 135–145)
Total Bilirubin: 0.3 mg/dL (ref 0.3–1.2)

## 2011-07-25 LAB — CBC
MCV: 81.8 fL (ref 78.0–100.0)
Platelets: 305 10*3/uL (ref 150–400)
RBC: 4.89 MIL/uL (ref 3.87–5.11)
RDW: 13.5 % (ref 11.5–15.5)
WBC: 5.9 10*3/uL (ref 4.0–10.5)

## 2011-07-25 LAB — RAPID URINE DRUG SCREEN, HOSP PERFORMED
Amphetamines: NOT DETECTED
Benzodiazepines: NOT DETECTED
Opiates: NOT DETECTED
Tetrahydrocannabinol: NOT DETECTED

## 2011-07-25 LAB — HEPATIC FUNCTION PANEL
AST: 11 U/L (ref 0–37)
Bilirubin, Direct: <0.1 mg/dL (ref 0.0–0.3)
Total Bilirubin: 0.2 mg/dL — ABNORMAL LOW (ref 0.3–1.2)

## 2011-07-25 LAB — HCG, SERUM, QUALITATIVE: Preg, Serum: NEGATIVE

## 2011-07-25 NOTE — ED Notes (Signed)
PA at bedside speaking to pt.  Pt in NAD, respirations even and unlabored.

## 2011-07-25 NOTE — ED Notes (Signed)
Neuro at bedside.  Pt will be moved to CDU 11 after consult.

## 2011-07-25 NOTE — ED Notes (Signed)
Pt resting in stretcher in NAD, respirations even and unlabored. 

## 2011-07-25 NOTE — Discharge Instructions (Signed)
Please read and follow all provided instructions.  Your diagnoses today include:  1. Paresthesias     Tests performed today include:  Blood counts - normal  Electrolytes - normal  Kidney and liver function - normal  Pregnancy test - not pregnant  Vital signs. See below for your results today.   Medications prescribed:   None  Home care instructions:  Follow any educational materials contained in this packet.  Follow-up instructions: Call the neurology group listed and follow-up in one week.  Please follow-up with your primary care provider in the next 3 days for further evaluation of your symptoms. If you do not have a primary care doctor -- see below for referral information.   Return instructions:   Please return to the Emergency Department if you experience worsening symptoms.   Return with worsening numbness, confusion, trouble speaking, trouble walking  Please return if you have any other emergent concerns.  Additional Information:  Your vital signs today were: BP 112/80  Pulse 73  Temp(Src) 98.8 F (37.1 C) (Oral)  Resp 20  SpO2 98%  LMP 06/22/2011 If your blood pressure (BP) was elevated above 135/85 this visit, please have this repeated by your doctor within one month. -------------- No Primary Care Doctor Call [facility]  [phone] Other agencies that provide inexpensive medical care    [facility]  [phone]    [facility]  [phone]    [facility]  [phone]    [facility]  [phone]    [facility]  [phone]    [facility]  [phone] -------------- RESOURCE GUIDE:  Dental Problems  Patients with Medicaid: [facility] [phone] [address]                                            [id number] Phone:  [phone]                                                   Phone:  [phone]  If unable to pay or uninsured, contact:   [facility] or [facility]. to become qualified for the adult dental clinic.  Chronic Pain Problems Contact [facility]  [phone] Patients need to be referred by their primary care doctor.  Insufficient Money for Medicine Contact [employer]:  call "211" or [facility] [phone].  Psychological Services [facility]  [phone] [facility]  [phone] [facility]   [phone] (emergency services [phone])  Substance Abuse Resources [facility]  [phone] [facility] [phone] [address] [place] [phone] Floydene Flock [phone] Residential & Outpatient Substance Abuse Program  [phone]  Abuse/Neglect [facility] Child Abuse Hotline 401-532-0200 [facility] Child Abuse Hotline 719-077-5720 (After Hours)  Emergency Shelter [facility] [phone]  Maternity Homes [facility] [phone] [facility] [phone]  [facility] [place]  [facility] [facility] [address] [place]                       [address]      [address]  [place]                                                [place]                            [place] Phone:  [phone]                                   Phone:  [phone]                 Phone:  [phone]  [place] [facility] Phone:  [phone]  [place] Child Abuse Hotline [phone]) 342-1394 [phone] (After Hours)    

## 2011-07-25 NOTE — ED Notes (Signed)
Pt to rr for ccus.

## 2011-07-25 NOTE — Consult Note (Signed)
[facility] CONSULT NOTE     Reason for Consult: left facial numbness ZO:XWRU facial, arm and leg numbness   HPI:   36 yo female with history of Left frontal parietal lobe meningioma with resection by Dr. Newell Coral Jan 2011. Prior to dx, she had "TIAs" and with right facial, arm and leg numbness. Post op, she had a seizure and was placed on dilantin, but this was stopped 6 mos ago since she has had no further seizures. She states that she has follow up MRIs yearly.  End of April 2013, she presented to ER with left facial numbness, left arm and left leg numbness with slurred speech. CT of head on 07/06/2011 showed:   Previous left frontoparietal craniotomy for resection of a meningioma but otherwise no acute intracranial abnormalities. She was sent home. However, for the past 2 weeks she has continued to have left facial numbness, left arm and left leg numbness, blurred bilateral vision persistently. She has had daily headaches which differ in severity at times, but remain left sided with photophobia and nausea. She has never been treated for migraines. She is not on HRT. No history of palpitations or heart abnormalities.  She presents to the ER today for fear that she is "having a stroke" and took a baby aspirin before calling EMS. Her main reason for presenting was for a severe left posterior headache. She is currently having photophobia with nausea. She admits to stressful home environment. 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   Past Medical History  Diagnosis Date  . TIA (transient ischemic attack)   . Seizures   . Brain tumor   . Embolism - blood clot in left ovary    Past Surgical History  Procedure Date  . Brain tumor removed     History reviewed. No pertinent family history.  Social History:  does not have a smoking history on file. She does not have any smokeless tobacco history on file. She reports that she does  not drink alcohol or use illicit drugs.  No Known Allergies  Medications:   None chronic   Blood pressure 112/80, pulse 73, temperature 98.8 F (37.1 C), temperature source Oral, resp. rate 20, last menstrual period 06/22/2011, SpO2 98.00%.  History obtained from the patient  General ROS: negative for - chills, fatigue, fever, night sweats, weight gain or weight loss Psychological ROS: negative for - behavioral disorder, hallucinations, memory difficulties, mood swings or suicidal ideation Ophthalmic ROS: negative for - blurry vision, double vision, eye pain or loss of vision ENT ROS: negative for - epistaxis, nasal discharge, oral lesions, sore throat, tinnitus or vertigo Allergy and Immunology ROS: negative for - hives or itchy/watery eyes Hematological and Lymphatic ROS: negative for - bleeding problems, bruising or swollen lymph nodes Endocrine ROS: negative for - galactorrhea, hair pattern changes, polydipsia/polyuria or temperature intolerance Respiratory ROS: negative for - cough, hemoptysis, shortness of breath or wheezing Cardiovascular ROS: negative for - chest pain, dyspnea on exertion, edema or irregular heartbeat Gastrointestinal ROS: negative for - abdominal pain, diarrhea, hematemesis, nausea/vomiting or stool incontinence Genito-Urinary ROS: negative for - dysuria, hematuria, incontinence or urinary frequency/urgency Musculoskeletal ROS: negative for - joint swelling or muscular weakness Neurological ROS: as noted in HPI Dermatological ROS: negative for rash and skin lesion changes    Neurologic Examination:  Mental Status: Alert, oriented,  thought content appropriate, somewhat flat affect.  Speech fluent without evidence of aphasia. Able to follow 3 step commands without difficulty. Cranial Nerves: II-Visual fields grossly intact. III/IV/VI-Extraocular movements intact.  Pupils reactive bilaterally. Winces to direct light to pupils. Fundi benign without papilledema.    V/VII-Smile symmetric VIII-grossly intact IX/X-normal gag XI-bilateral shoulder shrug, no nuchal rigidity XII-midline tongue extension Motor: 5/5 bilaterally with normal tone and bulk Sensory: Pinprick and light touch intact throughout, bilaterally Deep Tendon Reflexes: 2+ and symmetric throughout Plantars: Downgoing bilaterally Cerebellar: Normal finger-to-nose, normal rapid alternating movements and normal heel-to-shin test.  Normal gait and station.  General: Anicteric, noncyanotic, nondiaphoretic.  HEENT: MMM, tympanic membranes clear bilaterally.    Results for orders placed during the hospital encounter of 07/25/11 (from the past 24 hour(s))  HEPATIC FUNCTION PANEL     Status: Abnormal   Collection Time   07/25/11  2:45 PM      Component Value Range   Total Protein 6.2  6.0 - 8.3 (g/dL)   Albumin 3.3 (*) 3.5 - 5.2 (g/dL)   AST 11  0 - 37 (U/L)   ALT 6  0 - 35 (U/L)   Alkaline Phosphatase 46  39 - 117 (U/L)   Total Bilirubin 0.2 (*) 0.3 - 1.2 (mg/dL)   Bilirubin, Direct <4.0  0.0 - 0.3 (mg/dL)   Indirect Bilirubin NOT CALCULATED  0.3 - 0.9 (mg/dL)        Assessment/Plan:   36 yo female with history of Left frontal parietal lobe meningioma with resection by Dr. Newell Coral Jan 2011. Now with left sided weakness, bilateral blurred vision, left posterior headache with photophobia. Patient is worried that she may be having a stroke. She has no history of stroke or MI. She does not smoke and does not take estrogen-containing medications.   CT brain 07/06/2011 reviewed: Previous left frontoparietal craniotomy for resection of a meningioma, with no acute intracranial abnormalities.  No recent MRI scan.  Recommendations:  1) Presentation with left sided numbness, left posterior headache.     -MRI Brain as outpatient to assess for stability of meningioma resection site.     -Additional labwork including BMP, TSH, Mg+, HCG, UDS     -Symptomatic treatment of headache and  outpatient neurology or PCP follow up. 2) History of seizure after meningioma resection. Formerly on Dilantin, which has since been discontinued without further seizures.   Gwendolyn Lima. Manson Passey [facility] [phone]  07/25/2011, 3:43 PM    Electronically signed: Dr. Caryl Pina

## 2011-07-25 NOTE — ED Notes (Signed)
Pt came to desk requesting d/c paper before this RN received report.

## 2011-07-25 NOTE — ED Notes (Signed)
Hx of brain surgery 2 years ago, sts blurred vision, numbness, tingling and neck pain, dizziness mainly on left side of head.

## 2011-07-25 NOTE — ED Provider Notes (Signed)
History  This chart was scribed for Doug Sou, MD by Bennett Scrape. This patient was seen in room STRE8/STRE8 and the patient's care was started at 2:25PM.  CSN: [id number]  Arrival date & time 07/25/11  1309   None     Chief Complaint  Patient presents with  . Numbness  . Blurred Vision     The history is provided by the patient. No language interpreter was used.    Melanie [name] is a 36 y.o. female with a h/o TIA and a brain tumor who presents to the Emergency Department complaining of 2 weeks of gradual onset, gradually worsening, intermittent blurred vision, neck stiffness, left-sided facial numbness, decreased appetite and lightheaded that she attributes to a impending TIA. She denies having numbness or blurred vision currently. She also reports having a self-diagnosed migraine earlier today that has since resolved. She was seen 2 days ago for similar symptoms with a negative MRI work up. She states that she was told that the prior TIA she experienced was caused by the brain tumor. She reports having brain surgery approximately two years ago. Pt states that the brain surgery has left her with mild chronic blurred vision and self-diagnosed migraines. Pt states that she was on anti-seizure medications but was taken off of them by Dr. Bettina Gavia. She states that the last time she saw Dr. Newell Coral   5 to 6 months ago. She has not followed up with a neurologist or PCP since then. She reports taking bayer ASA to prevent her from having another TIA. She denies fever, emesis, diarrhea and chest pain as associated symptoms. She also has a h/o seizures and an embolism in her left ovary.   Past Medical History  Diagnosis Date  . TIA (transient ischemic attack)   . Seizures   . Brain tumor  2 years ago  . Embolism - blood clot in left ovary    Past Surgical History  Procedure Date  . Brain tumor removed     History reviewed. No pertinent family history.  History  Substance  Use Topics  . Smoking status: Not on file  . Smokeless tobacco: Not on file  . Alcohol Use: No     Review of Systems  Constitutional: Negative.   HENT: Positive for neck stiffness. Negative for congestion and trouble swallowing.   Eyes: Positive for visual disturbance.  Respiratory: Negative.   Cardiovascular: Negative.   Gastrointestinal: Negative.   Skin: Negative.   Neurological: Positive for dizziness and numbness. Negative for weakness.  Hematological: Negative.   Psychiatric/Behavioral: Negative.     Allergies  Review of patient's allergies indicates no known allergies.  Home Medications   Current Outpatient Rx  Name Route Sig Dispense Refill  . ASPIRIN EC 81 MG PO TBEC Oral Take 162 mg by mouth daily.    . IBUPROFEN 200 MG PO TABS Oral Take 400 mg by mouth every 6 (six) hours as needed. For pain      Triage Vitals: BP 128/71  Pulse 64  Temp(Src) 98 F (36.7 C) (Oral)  Resp 18  SpO2 100%  LMP 06/22/2011  Physical Exam  Nursing note and vitals reviewed. Constitutional: She is oriented to person, place, and time. She appears well-developed and well-nourished. No distress.  HENT:  Head: Normocephalic and atraumatic.  Eyes: EOM are normal.       Fundi benign   Neck: Neck supple. No tracheal deviation present.       No bruits, trachea is midline  Cardiovascular:  Normal rate and regular rhythm.   Pulmonary/Chest: Effort normal and breath sounds normal. No respiratory distress.  Musculoskeletal: Normal range of motion.  Neurological: She is alert and oriented to person, place, and time.       Gait is normal, no pronator's drift, finger-to-nose normal Romberg normal DTRs symmetric bilaterally at knee jerk ankle jerk biceps toes to her groin bilaterally  Skin: Skin is warm and dry.  Psychiatric: She has a normal mood and affect. Her behavior is normal.    ED Course  Procedures (including critical care time)  DIAGNOSTIC STUDIES: Oxygen Saturation is 100% on  room air, normal by my interpretation.    COORDINATION OF CARE: 2:32PM-Discussed reviewing pt's prior medical records to determine a better course of action with pt and pt agreed.   Labs Reviewed  HEPATIC FUNCTION PANEL - Abnormal; Notable for the following:    Albumin 3.3 (*)    Total Bilirubin 0.2 (*)    All other components within normal limits   No results found.   No diagnosis found.  CDU note:  4:22 PM Patient seen and examined. From stretcher triage -- h/o meningioma s/p resection. Pt having L hand numbness, facial numbness and bilateral blurry vision. Was seen for same several days ago and had negative CT.   Vital signs reviewed and are as follows: Filed Vitals:   07/25/11 1530  BP: 112/80  Pulse: 73  Temp: 98.8 F (37.1 C)  Resp: 20   Neurohospitalist PA-C has seen. Awaiting recommendations.   Exam:  Gen anxious but otherwise NAD; Heart RRR, nml S1,S2, no m/r/g; Lungs CTAB; Abd soft, NT, no rebound or guarding; Ext 2+ pedal pulses bilaterally, no edema; Neuro strength and sensation grossly intact, CN III-XII grossly intact.      MDM  Laboratory evaluation and note from 07/06/2011 reviewed,.. Patient has no evidence of stroke clinically. Has nonfocal neurologic exam Neurology consultation called to evaluate patient in the emergency department  I personally performed the services described in this documentation, which was scribed in my presence. The recorded information has been reviewed and considered.       Doug Sou, MD 07/25/11 2139

## 2011-07-25 NOTE — ED Notes (Addendum)
Pt reporting dizziness, tingling in face x several weeks. Hx of brain surgery for tumor removal. Denying any cp or sob. No neuro deficits noted. Alert x 4, responding appropriately. NAD. Pt reporting diminished appetite x several weeks

## 2011-07-30 ENCOUNTER — Emergency Department (HOSPITAL_COMMUNITY): Payer: Self-pay

## 2011-07-30 ENCOUNTER — Encounter (HOSPITAL_COMMUNITY): Payer: Self-pay | Admitting: *Deleted

## 2011-07-30 ENCOUNTER — Emergency Department (HOSPITAL_COMMUNITY)
Admission: EM | Admit: 2011-07-30 | Discharge: 2011-07-30 | Disposition: A | Discharge status: Home / Self Care | Payer: Self-pay | Attending: Emergency Medicine | Admitting: Emergency Medicine

## 2011-07-30 DIAGNOSIS — G9389 Other specified disorders of brain: Secondary | ICD-10-CM | POA: Insufficient documentation

## 2011-07-30 DIAGNOSIS — R51 Headache: Secondary | ICD-10-CM | POA: Insufficient documentation

## 2011-07-30 DIAGNOSIS — R200 Anesthesia of skin: Secondary | ICD-10-CM

## 2011-07-30 DIAGNOSIS — R209 Unspecified disturbances of skin sensation: Secondary | ICD-10-CM | POA: Insufficient documentation

## 2011-07-30 DIAGNOSIS — Z8679 Personal history of other diseases of the circulatory system: Secondary | ICD-10-CM | POA: Insufficient documentation

## 2011-07-30 LAB — CBC
MCH: 27.1 pg (ref 26.0–34.0)
MCHC: 33.4 g/dL (ref 30.0–36.0)
MCV: 81.2 fL (ref 78.0–100.0)
Platelets: 258 10*3/uL (ref 150–400)
RDW: 13.1 % (ref 11.5–15.5)

## 2011-07-30 LAB — BASIC METABOLIC PANEL
Calcium: 8.8 mg/dL (ref 8.4–10.5)
Creatinine, Ser: 0.65 mg/dL (ref 0.50–1.10)
GFR calc Af Amer: >90 mL/min (ref >90)
GFR calc non Af Amer: >90 mL/min (ref >90)

## 2011-07-30 LAB — PREGNANCY, URINE: Preg Test, Ur: NEGATIVE

## 2011-07-30 MED ORDER — GADOBENATE DIMEGLUMINE 529 MG/ML IV SOLN
13.0000 mL | Freq: Once | INTRAVENOUS | Status: AC | PRN
  Administered 2011-07-30: 13 mL via INTRAVENOUS

## 2011-07-30 NOTE — ED Notes (Signed)
Pt c/o loss of appetite, numbness, blurred vision x's 3 weeks. Reports being seen at [facility] twice for same thing. States "I've had a tumor in my brain and I think something is pressing on my optic nerve, its causing me to have blurred vision, in the car on the way here I drank some water and my heart started racing like I was having a heart attack." Pt denies c/o pain. States "I just feel not right in my head"

## 2011-07-30 NOTE — ED Notes (Signed)
Provided patient with crackers and water. Patient sitting up talking on phone.

## 2011-07-30 NOTE — Discharge Instructions (Signed)
RESOURCE GUIDE  Dental Problems  Patients with Medicaid: [facility]                     [address]                                           Phone:  [phone]                                                  If unable to pay or uninsured, contact:  [facility] or [facility] to become qualified for the adult dental clinic.  Chronic Pain Problems Contact [facility]  [phone] Patients need to be referred by their primary care doctor.  Insufficient Money for Medicine Contact [employer]:  call "211" or [facility] 271-5999.  No Primary Care Doctor Call [facility]  832-8000 Other agencies that provide inexpensive medical care    [facility]  [phone]    [facility]  [phone]    [facility]  [phone]    [facility]  [phone]    [facility]  [phone]    [facility]  [phone]  Substance Abuse Resources [facility]  336-882-2125 [facility] 336-784-9470 [facility] 336-285-9073 [facility] 336-845-3988 Residential & Outpatient Substance Abuse Program  800-659-3381  Psychological Services [facility]  832-9600 [facility]  378-7881 [facility]   [phone] (emergency services [phone])  Abuse/Neglect [place] Child Abuse Hotline (336) 641-3795 [place] Child Abuse Hotline 800-378-5315 (After Hours)  Emergency Shelter [facility] (336) 271-5985  [facility] (336) 275-9566 [facility] (704) 372-4663  MRSA Hotline #:   832-7006    [place] Resources  [facility] [place]  [employer]                           [facility] [address] [place]                     [address]         [address]  [place]                                                [place]                              [place] Phone:  [phone]                                  Phone:  [phone]                   Phone:  [phone]  [facility] Phone:  [phone]  [place] Child Abuse Hotline (336) 342-1394 (336)   342-3537 (After Hours) 

## 2011-07-30 NOTE — ED Notes (Signed)
Pt return from mri. Pt is stable and on her cell phone

## 2011-07-30 NOTE — ED Notes (Signed)
[id number]<AV> Expected date:<BR> Expected time: 3:10 PM<BR> Means of arrival:<BR> Comments:<BR> PTAR 34 - 80yoF Foot wound

## 2011-07-30 NOTE — ED Provider Notes (Signed)
History     CSN: [id number]  Arrival date & time 07/30/11  1219   First MD Initiated Contact with Patient 07/30/11 1608      Chief Complaint  Patient presents with  . Numbness    HPI Pt states she was driving today and she started to feel very weak.  She felt lightheaded and her heart was racing.  Pt states this has also been going on with trouble with numbness and blurred vision for the last three weeks  She is worried that she had an ocular stroke or that her tumor is back.  Pt states she looked it up on her phone and that is what she thinks is wrong.   She was seen in the [facility] ED previously and had a CT scan and saw a neurologist.  Pt states her symptoms are not getting any better. Past Medical History  Diagnosis Date  . TIA (transient ischemic attack)   . Seizures   . Brain tumor   . Embolism - blood clot in left ovary    Past Surgical History  Procedure Date  . Brain tumor removed     History reviewed. No pertinent family history.  History  Substance Use Topics  . Smoking status: Not on file  . Smokeless tobacco: Not on file  . Alcohol Use: No    OB History    Grav Para Term Preterm Abortions TAB SAB Ect Mult Living                  Review of Systems  All other systems reviewed and are negative.    Allergies  Review of patient's allergies indicates no known allergies.  Home Medications   Current Outpatient Rx  Name Route Sig Dispense Refill  . ASPIRIN EC 81 MG PO TBEC Oral Take 162 mg by mouth daily.    . IBUPROFEN 200 MG PO TABS Oral Take 400 mg by mouth every 6 (six) hours as needed. For pain      BP 133/65  Pulse 77  Temp(Src) 98.4 F (36.9 C) (Oral)  Resp 20  Ht 5\' 1"  (1.549 m)  Wt 149 lb 12.8 oz (67.949 kg)  BMI 28.30 kg/m2  SpO2 97%  LMP 07/08/2011  Physical Exam  Nursing note and vitals reviewed. Constitutional: She is oriented to person, place, and time. She appears well-developed and well-nourished. No distress.  HENT:    Head: Normocephalic and atraumatic.  Right Ear: External ear normal.  Left Ear: External ear normal.  Mouth/Throat: Oropharynx is clear and moist.  Eyes: Conjunctivae and EOM are normal. Pupils are equal, round, and reactive to light. Right eye exhibits no discharge. Left eye exhibits no discharge. No scleral icterus.  Neck: Neck supple. No tracheal deviation present.  Cardiovascular: Normal rate, regular rhythm and intact distal pulses.   Pulmonary/Chest: Effort normal and breath sounds normal. No stridor. No respiratory distress. She has no wheezes. She has no rales.  Abdominal: Soft. Bowel sounds are normal. She exhibits no distension. There is no tenderness. There is no rebound and no guarding.  Musculoskeletal: She exhibits no edema and no tenderness.  Neurological: She is alert and oriented to person, place, and time. She has normal strength. No cranial nerve deficit ( no gross defecits noted) or sensory deficit. She exhibits normal muscle tone. She displays no seizure activity. Coordination normal.       No pronator drift bilateral upper extrem, able to hold both legs off bed for 5 seconds, sensation  intact in all extremities, no visual field cuts, no left or right sided neglect  Skin: Skin is warm and dry. No rash noted.  Psychiatric: She has a normal mood and affect.    ED Course  Procedures (including critical care time)  Rate: 64  Rhythm: normal sinus rhythm  QRS Axis: normal  Intervals: normal  ST/T Wave abnormalities: normal  Conduction Disutrbances:none  Narrative Interpretation:   Old EKG Reviewed: none available   Labs Reviewed  CBC  BASIC METABOLIC PANEL  PREGNANCY, URINE   Mr Laqueta Jean Wo Contrast  07/30/2011  *RADIOLOGY REPORT*  Clinical Data: Meningioma resection 2011.  History of TIA with right-sided numbness prior to resection.  History of seizure after resection.  New left-sided facial, left arm and leg numbness with visual problems and slurred speech over  past 3 weeks.  MRI HEAD WITHOUT AND WITH CONTRAST  Technique:  Multiplanar, multiecho pulse sequences of the brain and surrounding structures were obtained according to standard protocol without and with intravenous contrast  Contrast: 13mL MULTIHANCE GADOBENATE DIMEGLUMINE 529 MG/ML IV SOLN  Comparison: 07/06/2011 CT.  01/16/2011 MR.  Findings: No acute infarct.  No intracranial hemorrhage.  Prior left frontal craniotomy for resection of meningioma.  No evidence of recurrence.  Small areas of encephalomalacia adjacent to the craniotomy site once again noted.  Slight progression of minimal nonspecific white matter type changes frontal lobes.  Major intracranial vascular structures are patent.  IMPRESSION: No acute infarct.  Prior left frontal craniotomy for resection of meningioma.  No evidence of recurrence.  Small areas of encephalomalacia adjacent to the craniotomy site once again noted.  Slight progression of minimal nonspecific white matter type changes frontal lobes.  Original Report Authenticated By: Fuller Canada, M.D.     MDM  I have reviewed the neurology consult note:  Excerpt follows below   36 yo female with history of Left frontal parietal lobe meningioma with resection by Dr. Newell Coral Jan 2011. Now with left sided weakness, bilateral blurred vision, left posterior headache with photophobia. Patient is worried that she may be having a stroke. She has no history of stroke or MI. She does not smoke and does not take estrogen-containing medications.  CT brain 07/06/2011 reviewed: Previous left frontoparietal craniotomy for resection of a meningioma, with no acute intracranial abnormalities.  No recent MRI scan.  Recommendations:  1) Presentation with left sided numbness, left posterior headache.  -MRI Brain as outpatient to assess for stability of meningioma resection site.  -Additional labwork including BMP, TSH, Mg+, HCG, UDS  -Symptomatic treatment of headache and outpatient neurology or  PCP follow up.  2) History of seizure after meningioma resection. Formerly on Dilantin, which has since been discontinued without further seizures.  Gwendolyn Lima. Manson Passey [facility]  [facility]  [phone]  07/25/2011, 3:43 PM  Electronically signed: Dr. Caryl Pina   The patient's evaluation in the emergency department is unremarkable. There are no focal neurologic findings on exam.  Her MRI does not show any sign of recurrence of the tumor and there is no evidence of prior stroke. She is not anemic. She does not have evidence of renal failure hyperglycemia. I think the patient would benefit from further outpatient workup with her primary doctor and possibly neurologist. It is possible the symptoms are anxiety related.  We'll discharge her home with reassurance and encourage followup as an outpatient           Celene Kras, MD 07/30/11 [phone]

## 2011-07-30 NOTE — ED Notes (Signed)
Patient transported to MRI 

## 2011-07-30 NOTE — ED Notes (Signed)
Pt has been instructed to call for assistance. Pt states she feels like something is in her head. edmd notified

## 2011-08-06 ENCOUNTER — Encounter (HOSPITAL_COMMUNITY): Payer: Self-pay | Admitting: *Deleted

## 2011-08-06 ENCOUNTER — Emergency Department (HOSPITAL_COMMUNITY)
Admission: EM | Admit: 2011-08-06 | Discharge: 2011-08-06 | Disposition: A | Discharge status: Home / Self Care | Payer: Self-pay | Attending: Emergency Medicine | Admitting: Emergency Medicine

## 2011-08-06 DIAGNOSIS — R51 Headache: Secondary | ICD-10-CM | POA: Insufficient documentation

## 2011-08-06 DIAGNOSIS — Z8673 Personal history of transient ischemic attack (TIA), and cerebral infarction without residual deficits: Secondary | ICD-10-CM | POA: Insufficient documentation

## 2011-08-06 DIAGNOSIS — R42 Dizziness and giddiness: Secondary | ICD-10-CM | POA: Insufficient documentation

## 2011-08-06 DIAGNOSIS — Z87898 Personal history of other specified conditions: Secondary | ICD-10-CM | POA: Insufficient documentation

## 2011-08-06 DIAGNOSIS — H53149 Visual discomfort, unspecified: Secondary | ICD-10-CM | POA: Insufficient documentation

## 2011-08-06 LAB — POCT I-STAT, CHEM 8
BUN: 10 mg/dL (ref 6–23)
Calcium, Ion: 1.24 mmol/L (ref 1.12–1.32)
Chloride: 106 mEq/L (ref 96–112)
Creatinine, Ser: 0.8 mg/dL (ref 0.50–1.10)
Glucose, Bld: 93 mg/dL (ref 70–99)
HCT: 42 % (ref 36.0–46.0)
Hemoglobin: 14.3 g/dL (ref 12.0–15.0)
Potassium: 3.8 mEq/L (ref 3.5–5.1)
Sodium: 141 mEq/L (ref 135–145)
TCO2: 23 mmol/L (ref 0–100)

## 2011-08-06 LAB — PREGNANCY, URINE: Preg Test, Ur: NEGATIVE

## 2011-08-06 MED ORDER — TRAMADOL HCL 50 MG PO TABS: 50.0000 mg | ORAL_TABLET | Freq: Four times a day (QID) | ORAL | Status: DC | PRN

## 2011-08-06 MED ORDER — DIAZEPAM 5 MG PO TABS: 5.0000 mg | ORAL_TABLET | Freq: Three times a day (TID) | ORAL | Status: DC | PRN

## 2011-08-06 NOTE — ED Notes (Signed)
Unable to locate the pt 

## 2011-08-06 NOTE — ED Provider Notes (Signed)
History    35yf with HAs. Sharp pain behind both eyes and associated with dizziness and photophobia. Lasts about an hour. Has 3-4 per day without appreciable exacerbating or relieving factors. Have been going on for about 3w. Previous evaluations for same. Hx of brain tumor and concerned may be related. NO fever or chills. NO neck pain or stiffness. Denies acute trauma. No n/v.  CSN: [id number]  Arrival date & time 08/06/11  1749   First MD Initiated Contact with Patient 08/06/11 1838      Chief Complaint  Patient presents with  . mulriple symptoms     (Consider location/radiation/quality/duration/timing/severity/associated sxs/prior treatment) HPI  Past Medical History  Diagnosis Date  . TIA (transient ischemic attack)   . Seizures   . Brain tumor   . Embolism - blood clot in left ovary    Past Surgical History  Procedure Date  . Brain tumor removed     No family history on file.  History  Substance Use Topics  . Smoking status: Not on file  . Smokeless tobacco: Not on file  . Alcohol Use: No    OB History    Grav Para Term Preterm Abortions TAB SAB Ect Mult Living                  Review of Systems   Review of symptoms negative unless otherwise noted in HPI.   Allergies  Review of patient's allergies indicates no known allergies.  Home Medications   Current Outpatient Rx  Name Route Sig Dispense Refill  . ASPIRIN EC 81 MG PO TBEC Oral Take 162 mg by mouth daily.    Marland Kitchen DIMENHYDRINATE 50 MG PO TABS Oral Take 50 mg by mouth every 8 (eight) hours as needed. For dizziness    . IBUPROFEN 200 MG PO TABS Oral Take 400 mg by mouth every 6 (six) hours as needed. For pain      BP 109/70  Pulse 87  Temp(Src) 98.3 F (36.8 C) (Oral)  Resp 16  SpO2 100%  LMP 07/08/2011  Physical Exam  Nursing note and vitals reviewed. Constitutional: She is oriented to person, place, and time. She appears well-developed and well-nourished. No distress.  HENT:  Head:  Normocephalic and atraumatic.  Right Ear: External ear normal.  Left Ear: External ear normal.  Mouth/Throat: Oropharynx is clear and moist.  Eyes: Conjunctivae and EOM are normal. Pupils are equal, round, and reactive to light. Right eye exhibits no discharge. Left eye exhibits no discharge.  Neck: Normal range of motion. Neck supple.  Cardiovascular: Normal rate, regular rhythm and normal heart sounds.  Exam reveals no gallop and no friction rub.   No murmur heard. Pulmonary/Chest: Effort normal and breath sounds normal. No respiratory distress.  Abdominal: Soft. She exhibits no distension. There is no tenderness.  Musculoskeletal: She exhibits no edema and no tenderness.  Lymphadenopathy:    She has no cervical adenopathy.  Neurological: She is alert and oriented to person, place, and time. She has normal reflexes. No cranial nerve deficit. She exhibits normal muscle tone. Coordination normal.  Skin: Skin is warm and dry. She is not diaphoretic.  Psychiatric: She has a normal mood and affect. Her behavior is normal. Thought content normal.    ED Course  Procedures (including critical care time)   Labs Reviewed  POCT I-STAT, CHEM 8  PREGNANCY, URINE   No results found. EKG:  Rhythm: nsr Rate: 67 Axis: normal Intervals/Conduction: normal ST segments: NS ST changes.  t wave flattening and in III Comparison: stable from 07/30/11   1. Headache       MDM  35yF with continued HAs. Etiology unclear. Recent evals for same without clear etiology. MR 5/20 with no concerning findings. No significant change in symptoms since then. Neuro exam nonfocal. Possibly HA disorder. Possible anxiety component. Pt worried about multiple possibly maladies which she has been reading online about. Low suspicion for emergent etiology. Do not feel that repeat imaging is indicated or that further emergent w/u of much utility. Pt needs to establish a PCP which she is in the process of. May benefit from  further neurology evaluation as oupt. Pt requesting medication. Will try tramadol and benzo. Pt counseled about sedating effects. Return precautions discussed.        Raeford Razor, MD 08/12/11 1105

## 2011-08-06 NOTE — Discharge Instructions (Signed)
Headaches, Frequently Asked Questions MIGRAINE HEADACHES Q: What is migraine? What causes it? How can I treat it? A: Generally, migraine headaches begin as a dull ache. Then they develop into a constant, throbbing, and pulsating pain. You may experience pain at the temples. You may experience pain at the front or back of one or both sides of the head. The pain is usually accompanied by a combination of:  Nausea.   Vomiting.   Sensitivity to light and noise.  Some people (about 15%) experience an aura (see below) before an attack. The cause of migraine is believed to be chemical reactions in the brain. Treatment for migraine may include over-the-counter or prescription medications. It may also include self-help techniques. These include relaxation training and biofeedback.  Q: What is an aura? A: About 15% of people with migraine get an "aura". This is a sign of neurological symptoms that occur before a migraine headache. You may see wavy or jagged lines, dots, or flashing lights. You might experience tunnel vision or blind spots in one or both eyes. The aura can include visual or auditory hallucinations (something imagined). It may include disruptions in smell (such as strange odors), taste or touch. Other symptoms include:  Numbness.   A "pins and needles" sensation.   Difficulty in recalling or speaking the correct word.  These neurological events may last as long as 60 minutes. These symptoms will fade as the headache begins. Q: What is a trigger? A: Certain physical or environmental factors can lead to or "trigger" a migraine. These include:  Foods.   Hormonal changes.   Weather.   Stress.  It is important to remember that triggers are different for everyone. To help prevent migraine attacks, you need to figure out which triggers affect you. Keep a headache diary. This is a good way to track triggers. The diary will help you talk to your healthcare professional about your  condition. Q: Does weather affect migraines? A: Bright sunshine, hot, humid conditions, and drastic changes in barometric pressure may lead to, or "trigger," a migraine attack in some people. But studies have shown that weather does not act as a trigger for everyone with migraines. Q: What is the link between migraine and hormones? A: Hormones start and regulate many of your body's functions. Hormones keep your body in balance within a constantly changing environment. The levels of hormones in your body are unbalanced at times. Examples are during menstruation, pregnancy, or menopause. That can lead to a migraine attack. In fact, about three quarters of all women with migraine report that their attacks are related to the menstrual cycle.  Q: Is there an increased risk of stroke for migraine sufferers? A: The likelihood of a migraine attack causing a stroke is very remote. That is not to say that migraine sufferers cannot have a stroke associated with their migraines. In persons under age 40, the most common associated factor for stroke is migraine headache. But over the course of a person's normal life span, the occurrence of migraine headache may actually be associated with a reduced risk of dying from cerebrovascular disease due to stroke.  Q: What are acute medications for migraine? A: Acute medications are used to treat the pain of the headache after it has started. Examples over-the-counter medications, NSAIDs, ergots, and triptans.  Q: What are the triptans? A: Triptans are the newest class of abortive medications. They are specifically targeted to treat migraine. Triptans are vasoconstrictors. They moderate some chemical reactions in the brain.   The triptans work on receptors in your brain. Triptans help to restore the balance of a neurotransmitter called serotonin. Fluctuations in levels of serotonin are thought to be a main cause of migraine.  Q: Are over-the-counter medications for migraine  effective? A: Over-the-counter, or "OTC," medications may be effective in relieving mild to moderate pain and associated symptoms of migraine. But you should see your caregiver before beginning any treatment regimen for migraine.  Q: What are preventive medications for migraine? A: Preventive medications for migraine are sometimes referred to as "prophylactic" treatments. They are used to reduce the frequency, severity, and length of migraine attacks. Examples of preventive medications include antiepileptic medications, antidepressants, beta-blockers, calcium channel blockers, and NSAIDs (nonsteroidal anti-inflammatory drugs). Q: Why are anticonvulsants used to treat migraine? A: During the past few years, there has been an increased interest in antiepileptic drugs for the prevention of migraine. They are sometimes referred to as "anticonvulsants". Both epilepsy and migraine may be caused by similar reactions in the brain.  Q: Why are antidepressants used to treat migraine? A: Antidepressants are typically used to treat people with depression. They may reduce migraine frequency by regulating chemical levels, such as serotonin, in the brain.  Q: What alternative therapies are used to treat migraine? A: The term "alternative therapies" is often used to describe treatments considered outside the scope of conventional Western medicine. Examples of alternative therapy include acupuncture, acupressure, and yoga. Another common alternative treatment is herbal therapy. Some herbs are believed to relieve headache pain. Always discuss alternative therapies with your caregiver before proceeding. Some herbal products contain arsenic and other toxins. TENSION HEADACHES Q: What is a tension-type headache? What causes it? How can I treat it? A: Tension-type headaches occur randomly. They are often the result of temporary stress, anxiety, fatigue, or anger. Symptoms include soreness in your temples, a tightening  band-like sensation around your head (a "vice-like" ache). Symptoms can also include a pulling feeling, pressure sensations, and contracting head and neck muscles. The headache begins in your forehead, temples, or the back of your head and neck. Treatment for tension-type headache may include over-the-counter or prescription medications. Treatment may also include self-help techniques such as relaxation training and biofeedback. CLUSTER HEADACHES Q: What is a cluster headache? What causes it? How can I treat it? A: Cluster headache gets its name because the attacks come in groups. The pain arrives with little, if any, warning. It is usually on one side of the head. A tearing or bloodshot eye and a runny nose on the same side of the headache may also accompany the pain. Cluster headaches are believed to be caused by chemical reactions in the brain. They have been described as the most severe and intense of any headache type. Treatment for cluster headache includes prescription medication and oxygen. SINUS HEADACHES Q: What is a sinus headache? What causes it? How can I treat it? A: When a cavity in the bones of the face and skull (a sinus) becomes inflamed, the inflammation will cause localized pain. This condition is usually the result of an allergic reaction, a tumor, or an infection. If your headache is caused by a sinus blockage, such as an infection, you will probably have a fever. An x-ray will confirm a sinus blockage. Your caregiver's treatment might include antibiotics for the infection, as well as antihistamines or decongestants.  REBOUND HEADACHES Q: What is a rebound headache? What causes it? How can I treat it? A: A pattern of taking acute headache medications too   often can lead to a condition known as "rebound headache." A pattern of taking too much headache medication includes taking it more than 2 days per week or in excessive amounts. That means more than the label or a caregiver advises.  With rebound headaches, your medications not only stop relieving pain, they actually begin to cause headaches. Doctors treat rebound headache by tapering the medication that is being overused. Sometimes your caregiver will gradually substitute a different type of treatment or medication. Stopping may be a challenge. Regularly overusing a medication increases the potential for serious side effects. Consult a caregiver if you regularly use headache medications more than 2 days per week or more than the label advises. ADDITIONAL QUESTIONS AND ANSWERS Q: What is biofeedback? A: Biofeedback is a self-help treatment. Biofeedback uses special equipment to monitor your body's involuntary physical responses. Biofeedback monitors:  Breathing.   Pulse.   Heart rate.   Temperature.   Muscle tension.   Brain activity.  Biofeedback helps you refine and perfect your relaxation exercises. You learn to control the physical responses that are related to stress. Once the technique has been mastered, you do not need the equipment any more. Q: Are headaches hereditary? A: Four out of five (80%) of people that suffer report a family history of migraine. Scientists are not sure if this is genetic or a family predisposition. Despite the uncertainty, a child has a 50% chance of having migraine if one parent suffers. The child has a 75% chance if both parents suffer.  Q: Can children get headaches? A: By the time they reach high school, most young people have experienced some type of headache. Many safe and effective approaches or medications can prevent a headache from occurring or stop it after it has begun.  Q: What type of doctor should I see to diagnose and treat my headache? A: Start with your primary caregiver. Discuss his or her experience and approach to headaches. Discuss methods of classification, diagnosis, and treatment. Your caregiver may decide to recommend you to a headache specialist, depending upon  your symptoms or other physical conditions. Having diabetes, allergies, etc., may require a more comprehensive and inclusive approach to your headache. The National Headache Foundation will provide, upon request, a list of NHF physician members in your state. Document Released: 05/19/2003 Document Revised: 02/15/2011 Document Reviewed: 10/27/2007 ExitCare Patient Information 2012 [employer].Headache, General, Unknown Cause The specific cause of your headache may not have been found today. There are many causes and types of headache. A few common ones are:  Tension headache.   Migraine.   Infections (examples: dental and sinus infections).   Bone and/or joint problems in the neck or jaw.   Depression.   Eye problems.  These headaches are not life threatening.  Headaches can sometimes be diagnosed by a patient history and a physical exam. Sometimes, lab and imaging studies (such as x-ray and/or CT scan) are used to rule out more serious problems. In some cases, a spinal tap (lumbar puncture) may be requested. There are many times when your exam and tests may be normal on the first visit even when there is a serious problem causing your headaches. Because of that, it is very important to follow up with your doctor or local clinic for further evaluation. FINDING OUT THE RESULTS OF TESTS  If a radiology test was performed, a radiologist will review your results.   You will be contacted by the emergency department or your physician if any test results   require a change in your treatment plan.   Not all test results may be available during your visit. If your test results are not back during the visit, make an appointment with your caregiver to find out the results. Do not assume everything is normal if you have not heard from your caregiver or the medical facility. It is important for you to follow up on all of your test results.  HOME CARE INSTRUCTIONS   Keep follow-up appointments with  your caregiver, or any specialist referral.   Only take over-the-counter or prescription medicines for pain, discomfort, or fever as directed by your caregiver.   Biofeedback, massage, or other relaxation techniques may be helpful.   Ice packs or heat applied to the head and neck can be used. Do this three to four times per day, or as needed.   Call your doctor if you have any questions or concerns.   If you smoke, you should quit.  SEEK MEDICAL CARE IF:   You develop problems with medications prescribed.   You do not respond to or obtain relief from medications.   You have a change from the usual headache.   You develop nausea or vomiting.  SEEK IMMEDIATE MEDICAL CARE IF:   If your headache becomes severe.   You have an unexplained oral temperature above 102 F (38.9 C), or as your caregiver suggests.   You have a stiff neck.   You have loss of vision.   You have muscular weakness.   You have loss of muscular control.   You develop severe symptoms different from your first symptoms.   You start losing your balance or have trouble walking.   You feel faint or pass out.  MAKE SURE YOU:   Understand these instructions.   Will watch your condition.   Will get help right away if you are not doing well or get worse.  Document Released: 02/26/2005 Document Revised: 02/15/2011 Document Reviewed: 10/16/2007 ExitCare Patient Information 2012 [employer].  RESOURCE GUIDE  Dental Problems  Patients with Medicaid: [facility]                     [facility] [address]                                           [address] Phone:  [phone]                                                  Phone:  [phone]  If unable to pay or uninsured, contact:  [facility] or [facility] to become qualified for the adult dental clinic.  Chronic Pain Problems Contact [facility]  [phone] Patients need to  be referred by their primary care doctor.  Insufficient Money for Medicine Contact [employer]:  call "211" or [facility] 271-5999.  No Primary Care Doctor Call [facility]  832-8000 Other agencies that provide inexpensive medical care    [facility]  832-8035    [facility]  832-7272    [facility]  [phone]    Women's Clinic  832-4777    [facility]    [phone]    [facility]  [phone]  Psychological Services [facility]  832-9600 [facility]  [phone] [facility]   800 853-5163 (emergency services 641-4993)  Substance Abuse Resources [facility]  336-882-2125 [facility] 336-784-9470 [facility] 336-285-9073 [facility] 336-845-3988 Residential & Outpatient Substance Abuse Program  800-659-3381  Abuse/Neglect [place] Child Abuse Hotline (336) 641-3795 [place] [facility] Abuse Hotline 800-378-5315 (After Hours)  Emergency Shelter [facility] (336) 271-5985  [facility] (336) 275-9566 [facility] (704) 372-4663  MRSA Hotline #:   832-7006    [place] [facility]  [facility] [place] [facility]     [employer]                          [facility] [address] [place]                       [address]      [address]                                                  [place]                            [place] Phone:  [phone]                                   Phone:  [phone]                 Phone:  [phone]  [facility] Phone:  [phone]  [place] Child Abuse Hotline (336) 342-1394 (336) 342-3537 (After Hours)   

## 2011-08-06 NOTE — ED Notes (Signed)
Pt reports slight dizziness with ambulation

## 2011-08-06 NOTE — ED Notes (Signed)
For three weeks she has had headaches dizziness eye pain and feeling she is going to pass out.

## 2011-08-10 ENCOUNTER — Encounter: Payer: Self-pay | Admitting: Family Medicine

## 2011-08-11 ENCOUNTER — Other Ambulatory Visit: Payer: Self-pay

## 2011-08-11 ENCOUNTER — Emergency Department (HOSPITAL_COMMUNITY)
Admission: EM | Admit: 2011-08-11 | Discharge: 2011-08-11 | Disposition: A | Discharge status: Home Health | Payer: Self-pay | Attending: Emergency Medicine | Admitting: Emergency Medicine

## 2011-08-11 ENCOUNTER — Encounter (HOSPITAL_COMMUNITY): Payer: Self-pay

## 2011-08-11 DIAGNOSIS — H538 Other visual disturbances: Secondary | ICD-10-CM | POA: Insufficient documentation

## 2011-08-11 DIAGNOSIS — R569 Unspecified convulsions: Secondary | ICD-10-CM | POA: Insufficient documentation

## 2011-08-11 DIAGNOSIS — Z8673 Personal history of transient ischemic attack (TIA), and cerebral infarction without residual deficits: Secondary | ICD-10-CM | POA: Insufficient documentation

## 2011-08-11 DIAGNOSIS — F419 Anxiety disorder, unspecified: Secondary | ICD-10-CM

## 2011-08-11 DIAGNOSIS — R55 Syncope and collapse: Secondary | ICD-10-CM | POA: Insufficient documentation

## 2011-08-11 DIAGNOSIS — F411 Generalized anxiety disorder: Secondary | ICD-10-CM | POA: Insufficient documentation

## 2011-08-11 LAB — POCT I-STAT, CHEM 8
BUN: 6 mg/dL (ref 6–23)
Chloride: 105 mEq/L (ref 96–112)
Creatinine, Ser: 0.8 mg/dL (ref 0.50–1.10)
Sodium: 142 mEq/L (ref 135–145)
TCO2: 23 mmol/L (ref 0–100)

## 2011-08-11 MED ORDER — POTASSIUM CHLORIDE CRYS ER 20 MEQ PO TBCR
40.0000 meq | EXTENDED_RELEASE_TABLET | Freq: Once | ORAL | Status: AC
  Administered 2011-08-11: 40 meq via ORAL
  Filled 2011-08-11: qty 2

## 2011-08-11 MED ORDER — LORAZEPAM 1 MG PO TABS
1.0000 mg | ORAL_TABLET | Freq: Once | ORAL | Status: AC
  Administered 2011-08-11: 1 mg via ORAL
  Filled 2011-08-11: qty 1

## 2011-08-11 MED ORDER — ALPRAZOLAM 0.5 MG PO TABS: 0.5000 mg | ORAL_TABLET | Freq: Every evening | ORAL | Status: DC | PRN

## 2011-08-11 NOTE — ED Notes (Signed)
Pt states she wants to go home.  Does not want any other tests run.  States she will follow up with her doctor on Monday.

## 2011-08-11 NOTE — ED Provider Notes (Signed)
History     CSN: [id number]  Arrival date & time 08/11/11  1555   First MD Initiated Contact with Patient 08/11/11 1601      Chief Complaint  Patient presents with  . Near Syncope  . Blurred Vision    (Consider location/radiation/quality/duration/timing/severity/associated sxs/prior treatment) HPI Comments: Patient is a 36 year old female with a history of TIA and brain tumor status post craniotomy in 2011 that presents to the emergency department with a chief complaint of blurred vision and constant feeling of presyncope.  Patient has been evaluated in this emergency department for similar symptoms on 4/26 and 5/20.  Patient had a negative CT and negative MR at that time.  Patient was also seen on 5/15 for headache and numbness. Patient chart reviewed and no acute abnormalities were found on radiological imaging or labs.  Patient states her current symptoms have only been affecting her for the last 3 weeks, but she denies any associated chest pain, full syncope, shortness of breath, diaphoresis, orthopnea, PND, leg swelling, cough, hemoptysis, trauma melena, hematochezia cough, fever, night sweats, chills.  Patient has no other complaints this time.    The history is provided by the patient.    Past Medical History  Diagnosis Date  . TIA (transient ischemic attack)   . Seizures   . Brain tumor 2011  . Embolism - blood clot in left ovary    Past Surgical History  Procedure Date  . Brain tumor removed 2011    Family History  Problem Relation Age of Onset  . Depression Mother   . Alcohol abuse Sister   . Alcohol abuse Brother   . Alzheimer's disease Maternal Grandmother   . Heart failure Father   . Hypertension Father   . Diabetes Maternal Grandmother   . Stroke Maternal Grandmother     History  Substance Use Topics  . Smoking status: Not on file  . Smokeless tobacco: Not on file  . Alcohol Use: No    OB History    Grav Para Term Preterm Abortions TAB SAB Ect Mult  Living                  Review of Systems  All other systems reviewed and are negative.    Allergies  Review of patient's allergies indicates no known allergies.  Home Medications   Current Outpatient Rx  Name Route Sig Dispense Refill  . ASPIRIN EC 81 MG PO TBEC Oral Take 162 mg by mouth daily.    Marland Kitchen DIAZEPAM 5 MG PO TABS Oral Take 5 mg by mouth every 8 (eight) hours as needed. For anxiety    . DIMENHYDRINATE 50 MG PO TABS Oral Take 50 mg by mouth every 8 (eight) hours as needed. For dizziness    . TRAMADOL HCL 50 MG PO TABS Oral Take 50 mg by mouth every 6 (six) hours as needed. For pain      BP 129/81  Pulse 78  Temp 98.6 F (37 C)  Resp 16  SpO2 100%  LMP 07/08/2011  Physical Exam  Nursing note and vitals reviewed. Constitutional: She is oriented to person, place, and time. She appears well-developed and well-nourished. No distress.  HENT:  Head: Normocephalic and atraumatic.  Eyes: Conjunctivae and EOM are normal.  Neck: Normal range of motion.  Cardiovascular:       Regular rate rhythm, no murmur or apparent she's on auscultation, intact distal pulses, no carotid bruits.  Pulmonary/Chest: Effort normal.  Abdominal:  Bowel sounds present.  Soft nontender abdomen.  Non-pulsatile aorta  Musculoskeletal: Normal range of motion.  Neurological: She is alert and oriented to person, place, and time.       Cranial nerves III through XII intact, normal gait, coordination and sensation intact, strength 5/5 in upper and lower extremities bilaterally, patellar DTRs intact  Skin: Skin is warm and dry. No rash noted. She is not diaphoretic.  Psychiatric:       Mood anxious, patient tearful    ED Course  Procedures (including critical care time)  Labs Reviewed - No data to display No results found.   No diagnosis found.    MDM  Near-syncope ? Anxiety   Patient with multiple evaluations in the emergency department, 6 visits within the last 6 months.  I do not  feel that further workup is indicated at this time due to patient's persistent symptoms without outpatient followup.  Again encouraged patient to schedule followup with Dr. Newell Coral for Monday morning.  Patient is also been advised to followup with cardiology for possible possible further evaluation of near syncope.  Patient has been hemodynamically stable and in no acute distress throughout entire hospital visit.  There is no orthostatic hypotension, EKG without significant changes, NO murmur on heart auscultation &  i-STAT Chm 8 without abnormalities.  It is felt this patient can be followed up as an outpatient and that there are no emergent causes as etiology for her symptoms.  Imaging of the brain not currently indicated due to no acute abnormal findings an MR on May 20.  Topics thoroughly with patient in regards to following up with outpatient providers.  Discussed above Dr. Judd Lien who is agreeable with my plan to dc patient.        Jaci Carrel, [place] 08/11/11 1751

## 2011-08-11 NOTE — ED Notes (Signed)
sts for three weeks now has had near syncope, black out speels and not feeling well. Seen for same.

## 2011-08-11 NOTE — ED Notes (Signed)
Pt is very anxious, worries that ativan may give her a "heart attack"  Reassurance given.

## 2011-08-11 NOTE — ED Notes (Signed)
Med given for anxiety.

## 2011-08-11 NOTE — Discharge Instructions (Signed)
It is recommended that you follow up with a PCP for further evaluation of your symptoms & possibly a neurologist and cardiologist listed above. Use the resource guide below to help you find a cardiologist.   Anxiety and Panic Attacks Your caregiver has informed you that you are having an anxiety or panic attack. There may be many forms of this. Most of the time these attacks come suddenly and without warning. They come at any time of day, including periods of sleep, and at any time of life. They may be strong and unexplained. Although panic attacks are very scary, they are physically harmless. Sometimes the cause of your anxiety is not known. Anxiety is a protective mechanism of the body in its fight or flight mechanism. Most of these perceived danger situations are actually nonphysical situations (such as anxiety over losing a job). CAUSES  The causes of an anxiety or panic attack are many. Panic attacks may occur in otherwise healthy people given a certain set of circumstances. There may be a genetic cause for panic attacks. Some medications may also have anxiety as a side effect. SYMPTOMS  Some of the most common feelings are:  Intense terror.   Dizziness, feeling faint.   Hot and cold flashes.   Fear of going crazy.   Feelings that nothing is real.   Sweating.   Shaking.   Chest pain or a fast heartbeat (palpitations).   Smothering, choking sensations.   Feelings of impending doom and that death is near.   Tingling of extremities, this may be from over-breathing.   Altered reality (derealization).   Being detached from yourself (depersonalization).  Several symptoms can be present to make up anxiety or panic attacks. DIAGNOSIS  The evaluation by your caregiver will depend on the type of symptoms you are experiencing. The diagnosis of anxiety or panic attack is made when no physical illness can be determined to be a cause of the symptoms. TREATMENT  Treatment to prevent  anxiety and panic attacks may include:  Avoidance of circumstances that cause anxiety.   Reassurance and relaxation.   Regular exercise.   Relaxation therapies, such as yoga.   Psychotherapy with a psychiatrist or therapist.   Avoidance of caffeine, alcohol and illegal drugs.   Prescribed medication.  SEEK IMMEDIATE MEDICAL CARE IF:   You experience panic attack symptoms that are different than your usual symptoms.   You have any worsening or concerning symptoms.  Document Released: 02/26/2005 Document Revised: 02/15/2011 Document Reviewed: 06/30/2009 [facility] Patient Information 2012 [place], [place].  Cardiac Event Monitoring A cardiac event monitor is a small recording device used to help detect abnormal heart rhythms. When the heart does not beat in a stable or regular pattern, this is called an arrhythmia. An arrhythmia may or may not be felt. Palpitations, or fast heart beats, may be felt. A cardiac event monitor is used to record the heart rhythm when noticeable symptoms such as the following occur:  Palpitations, such as heart racing or fluttering.   Dizziness.   Fainting or lightheadedness.   Unexplained weakness.  The monitor is wired to two electrodes placed on your chest. Electrodes are flat sticky disks that attach to your skin. The monitor stores the heart rhythm and can be worn for up to 30 days. You will wear the monitor at all times, except when bathing. When you feel symptoms of heart trouble, such as dizziness, weakness, lightheadedness, palpitations, "thumping," shortness of breath or a fluttering or racing heart, you will push  a button on the monitor to record the heart rhythm.  Your caregiver will also ask you to keep a diary of your activities, such as walking, doing chores and taking medicine. Be sure to note what you are doing when you experience heart symptoms. This will help your caregiver determine what might be contributing to your symptoms. The  information stored in your monitor will be reviewed by your caregiver alongside your diary entries.  LET YOUR CAREGIVER KNOW ABOUT: Allergies to any kind of medical tape. PROCEDURE  A technician will prepare your chest for the electrode placement. The technician will show you how to place the electrodes, how to work the monitor, how to replace the batteries and how to fill in your diary. Take time to practice using the monitor before you leave the office. Make sure you understand how to send the information from the monitor to your doctor. This requires a telephone with a land line, not a cellphone.   If you start to feel lightheaded, dizzy, weak or have fluttering or racing sensations in your heart, press the record button. The monitor is always on and records what happened slightly before you pressed the button, so do not worry about being too late to get good information.  HOME CARE INSTRUCTIONS   Wear your monitor at all times, except when you are in water:   Do not get the monitor wet.   Take the monitor off when bathing, do not swim or use a hot tub with it on.   Keep your skin clean, do not put body lotion or moisturizer on your chest.   It's possible that your skin under the electrodes could become irritated. To keep this from happening, try to put the electrodes in slightly different places on your chest. However, they must remain in the area under your left breast and in the upper right section of your chest.   Change the electrodes daily or any time they stop sticking to your skin. You might need to use tape to keep them on.   Make sure the monitor is safely clipped to your clothing or in a location close to your body that your caregiver recommends.   Keep a diary of your activities, especially what you were doing when you pushed the button to record your heart symptoms.   Change the batteries as recommended by your caregiver.   Send the recorded information as recommended by  your caregiver. It is important to understand that your caregiver does not have instantaneous results when a recording is made.  SEEK MEDICAL CARE IF:   Your doctor reviews your records and advises you to come to the office or go to the ER.   You have questions or concerns about the cardiac event monitor.  SEEK IMMEDIATE MEDICAL CARE IF:   You have chest pain.   You have difficulty breathing or shortness of breath.   You develop a very fast heartbeat or your heart "skips" beats.   You develop dizziness which lasts several minutes.   You faint or feel you are about to faint.  MAKE SURE YOU:   Understand these instructions.   Will watch your condition.   Will get help right away if you are not doing well or get worse.  Document Released: 12/06/2007 Document Revised: 02/15/2011 Document Reviewed: 12/06/2007 [facility] Patient Information 2012 [place], [place].  RESOURCE GUIDE  Chronic Pain Problems: Contact [facility]  [phone] Patients need to be referred by their primary  care doctor.  Insufficient Money for Medicine: Contact United Way:  call "211" or [facility] (979) 814-4378.  No Primary Care Doctor: - Call [facility]  [phone] - can help you locate a primary care doctor that  accepts your insurance, provides certain services, etc. - Physician Referral Service- [phone]  Agencies that provide inexpensive medical care: - [facility]  [phone] - [facility]  [phone] - [facility]  [phone] - Women's Clinic  [phone] - Planned Parenthood  [phone] [facility]  [phone]  Medicaid-accepting [facility] Providers: - [facility]- [address]  [phone], Mon-Fri 9am-7pm, Sat 9am-1pm - [facility]- [address] [place], [address] [place]  [phone] - [facility]- [address], [address]  [place]  [phone] [facility]- [address]  [phone] - Renaye Rakers- [address] [place], [address], [phone]  Only accepts [place] patients after they have their name  applied to their card  Self Pay (no insurance) in [place]: - Sickle Cell Patients: Dr Willey Blade, [facility]  [address] [place], [phone] - [facility] Urgent Care- [address] [place]  [phone]       [facility] [place]- [address]       -     [facility]- see information above (Speak to [employer] if you do not have insurance)       -  [facility]- [address] [place], [phone]       -  [facility]- [address],  [phone]       -  [facility]- [address] [phone]       -  Dr Julio Sicks-  [name], [place], [phone]       -  [facility] Urgent Care- [address] [phone]       -  [facility] [place]- [address] [phone], also [address] [phone]       -    [facility]- [address] [place], [phone], 1st & 3rd Saturday   every month, 10am-1pm  1) Find a Doctor and Pay Out of Pocket Although you won't have to find out who is covered by your insurance plan, it is a good idea to ask around and get recommendations. You will then need to call the office and see if the doctor you have chosen will accept you as a new patient and what types of options they offer for patients who are self-pay. Some doctors offer discounts or will set up payment plans for their patients who do not have insurance, but you will need to ask so you aren't surprised when you get to your appointment.  2) Contact Your Local Health Department Not all health departments have doctors that can see patients for sick visits, but many do, so it is worth a call to see if yours does. If you don't know where your local health department is, you can check in  your phone book. The CDC also has a tool to help you locate your state's health department, and many state websites also have listings of all of their local health departments.  3) Find a Walk-in Clinic If your illness is not likely to  be very severe or complicated, you may want to try a walk in clinic. These are popping up all over the country in pharmacies, drugstores, and shopping centers. They're usually staffed by nurse practitioners or physician assistants that have been trained to treat common illnesses and complaints. They're usually fairly quick and inexpensive. However, if you have serious medical issues or chronic medical problems, these are probably not your best option  STD Testing - [facility] Department of [facility] [place], STD Clinic, [address], [place], phone [phone] or [phone].  Monday - Friday, call for an appointment. [facility] Department of [employer], STD Clinic, [address], [place], phone [phone] or [phone].  Monday - Friday, call for an appointment.  Abuse/Neglect: [facility] Child Abuse Hotline (786)805-5002 [facility] Child Abuse Hotline (870)669-8208 (After Hours)  Emergency Shelter:  [facility] [phone]  Maternity Homes: - [facility] at [facility] [place] [facility] [phone] - [facility] 8066991636  MRSA Hotline #:   3017900623  [facility] Resources  [facility] [place]  [employer] [facility] Dept. [address]                 [address]         [address] [place] [address]  Blondell Reveal Phone:  [phone]                                  Phone:  [phone]                   Phone:  [phone]  [facility] Health, [phone] - [phone]       -     [facility] in [place], [address],                                  [phone], [facility] Child Abuse Hotline (575)525-3146 or (707) 134-1114 (After Hours)   Behavioral Health Services  Substance Abuse Resources: - Alcohol and Drug Services  4381319257 - [facility] (819)003-0574 - [facility] (815)280-7659 Floydene Flock [phone] - Residential & Outpatient Substance Abuse Program  [phone]  Psychological Services: [facility]  2567887521 Services  [phone] - [facility], [phone] [place]. [address], [place], ACCESS LINE: [phone] or [phone], [employer]  Dental Assistance  If unable to pay or uninsured, contact:  [facility] or [facility]. to become qualified for the adult dental clinic.  Patients with Medicaid: [facility] [phone] [address], [phone] [address] [phone]  If unable to pay, or uninsured, contact [facility] (937)014-0920) or [facility] Department [phone] in [place], [phone] in [place]) to become qualified  for the adult dental clinic  Other Low-Cost Community Dental Services: - [facility]- [address] [place], [place], [address], [phone], [phone], 2nd and 4th Thursday of the month at 6:30am.  10 clients each day by appointment, can sometimes see walk-in patients if someone does not show for an appointment. [facility]- [address] [place], [place], [address], [phone] - [facility]- [address] [place], [place], [address], [phone] - [place]- [phone] [facility] Health Department- [phone] [facility] Main Department- [phone]

## 2011-08-13 ENCOUNTER — Encounter (HOSPITAL_COMMUNITY): Payer: Self-pay | Admitting: Emergency Medicine

## 2011-08-13 ENCOUNTER — Observation Stay (HOSPITAL_COMMUNITY)
Admission: EM | Admit: 2011-08-13 | Discharge: 2011-08-13 | Disposition: A | Discharge status: Home / Self Care | Payer: Self-pay | Source: Ambulatory Visit | Attending: Emergency Medicine | Admitting: Emergency Medicine

## 2011-08-13 DIAGNOSIS — I6350 Cerebral infarction due to unspecified occlusion or stenosis of unspecified cerebral artery: Secondary | ICD-10-CM

## 2011-08-13 DIAGNOSIS — F411 Generalized anxiety disorder: Secondary | ICD-10-CM | POA: Insufficient documentation

## 2011-08-13 DIAGNOSIS — R55 Syncope and collapse: Principal | ICD-10-CM | POA: Insufficient documentation

## 2011-08-13 DIAGNOSIS — R42 Dizziness and giddiness: Secondary | ICD-10-CM | POA: Insufficient documentation

## 2011-08-13 LAB — URINALYSIS, ROUTINE W REFLEX MICROSCOPIC
Ketones, ur: NEGATIVE mg/dL
Leukocytes, UA: NEGATIVE
Nitrite: NEGATIVE
Protein, ur: NEGATIVE mg/dL
pH: 6.5 (ref 5.0–8.0)

## 2011-08-13 LAB — CBC
HCT: 40 % (ref 36.0–46.0)
Hemoglobin: 13.6 g/dL (ref 12.0–15.0)
MCH: 27.7 pg (ref 26.0–34.0)
MCV: 81.5 fL (ref 78.0–100.0)
RBC: 4.91 MIL/uL (ref 3.87–5.11)

## 2011-08-13 LAB — POCT I-STAT, CHEM 8
Hemoglobin: 14.3 g/dL (ref 12.0–15.0)
Sodium: 140 mEq/L (ref 135–145)
TCO2: 22 mmol/L (ref 0–100)

## 2011-08-13 LAB — POCT PREGNANCY, URINE: Preg Test, Ur: NEGATIVE

## 2011-08-13 LAB — LIPID PANEL
LDL Cholesterol: 139 mg/dL — ABNORMAL HIGH (ref 0–99)
VLDL: 22 mg/dL (ref 0–40)

## 2011-08-13 LAB — PROTIME-INR: Prothrombin Time: 12.9 seconds (ref 11.6–15.2)

## 2011-08-13 LAB — APTT: aPTT: 30 seconds (ref 24–37)

## 2011-08-13 MED ORDER — SODIUM CHLORIDE 0.9 % IV BOLUS (SEPSIS)
1000.0000 mL | Freq: Once | INTRAVENOUS | Status: AC
  Administered 2011-08-13: 1000 mL via INTRAVENOUS

## 2011-08-13 MED ORDER — FAMOTIDINE IN NACL 20-0.9 MG/50ML-% IV SOLN
20.0000 mg | INTRAVENOUS | Status: AC
  Administered 2011-08-13: 20 mg via INTRAVENOUS
  Filled 2011-08-13: qty 50

## 2011-08-13 MED ORDER — MECLIZINE HCL 12.5 MG PO TABS: 12.5000 mg | ORAL_TABLET | Freq: Three times a day (TID) | ORAL | Status: DC | PRN

## 2011-08-13 NOTE — ED Notes (Signed)
CDU unable to take patient at this time 

## 2011-08-13 NOTE — ED Notes (Signed)
Pt reports loss of appetite, "shakes", unable to sleep, and LUQ pain x 3 weeks.  Pt very anxious and states she has been taking ASA 81 mg for the last 3 weeks to try to thin her blood because she does not feel right.  States for the last week she has been taking ASA 81 mg x 14 each day. Pt states she feels like she is going to pass out.  Hx of brain tumor and TIA.

## 2011-08-13 NOTE — Progress Notes (Signed)
VASCULAR LAB PRELIMINARY  PRELIMINARY  PRELIMINARY  PRELIMINARY  Carotid Dopplers completed.    Preliminary report:  There is no ICA stenosis.  Vertebral artery flow is antegrade.  Sherren Kerns [place], 08/13/2011, 8:06 AM

## 2011-08-13 NOTE — ED Notes (Signed)
PT IS TEARFUL "I THINK I AM DYING" . STATES SHE HAS SEEN A NEUROLOGIST AND HAD AN MRI. STATES SHE CONTINUES TO FEEL "BAD". BUT EVERYONE KEEPS TELLING HER SHE IS "FINE". SHE COMPLAINS HER LYMPH NODES ARE SWOLLEN AND SHE IS CONCERNED SHE HAS A TUMOR. STATS SHE HAS NO APPETITE FOR THE PAST 3 WEEKS SINCE THIS HAS BEEN ONGOING. STATES SHE IS EATING AND DRINKING BECAUSE SHE KNOWS SHE HAS TO. SINUS RHYTHM ON MONITOR. STATES SHE DID HAVE SOME PAIN IN HER LEGS LAST PM . THEY ARE NOT SWOLLEN. PULSES PRESENT. BOTH ARE WARM TO TOUCH. PT STATES " I JUST WANT TO KNOW WHAT IS WRONG WITH ME. I HAVE HAD 2 TUMORS REMOVED IN THE PAST AND I WONDER IF I HAVE ANOTHER ONE. PT ADMITS SHE HAS BEEN STRESSED BECAUSE SHE DOESN'T KNOW WHAT IS WRONG

## 2011-08-13 NOTE — ED Notes (Signed)
Returns from vascular lab, ambulatory to the bathroom without difficulty

## 2011-08-13 NOTE — ED Provider Notes (Signed)
11:49 AM Patient was placed in CDU pending 2-D echo and Doppler carotids. Imaging studies have been within normal limits. I discussed this with patient. Advised we will treat for vertigo but recommended continued followup with neurologist and cardiologist. Patient voices understanding and is ready for discharge.  Results for orders placed during the hospital encounter of 08/13/11  CBC      Component Value Range   WBC 7.1  4.0 - 10.5 (K/uL)   RBC 4.91  3.87 - 5.11 (MIL/uL)   Hemoglobin 13.6  12.0 - 15.0 (g/dL)   HCT 46.9  62.9 - 52.8 (%)   MCV 81.5  78.0 - 100.0 (fL)   MCH 27.7  26.0 - 34.0 (pg)   MCHC 34.0  30.0 - 36.0 (g/dL)   RDW 41.3  24.4 - 01.0 (%)   Platelets 239  150 - 400 (K/uL)  PROTIME-INR      Component Value Range   Prothrombin Time 12.9  11.6 - 15.2 (seconds)   INR 0.95  0.00 - 1.49   APTT      Component Value Range   aPTT 30  24 - 37 (seconds)  URINALYSIS, ROUTINE W REFLEX MICROSCOPIC      Component Value Range   Color, Urine YELLOW  YELLOW    APPearance CLEAR  CLEAR    Specific Gravity, Urine 1.005  1.005 - 1.030    pH 6.5  5.0 - 8.0    Glucose, UA NEGATIVE  NEGATIVE (mg/dL)   Hgb urine dipstick NEGATIVE  NEGATIVE    Bilirubin Urine NEGATIVE  NEGATIVE    Ketones, ur NEGATIVE  NEGATIVE (mg/dL)   Protein, ur NEGATIVE  NEGATIVE (mg/dL)   Urobilinogen, UA 0.2  0.0 - 1.0 (mg/dL)   Nitrite NEGATIVE  NEGATIVE    Leukocytes, UA NEGATIVE  NEGATIVE   HEMOGLOBIN A1C      Component Value Range   Hemoglobin A1C 5.2  <5.7 (%)   Mean Plasma Glucose 103  <117 (mg/dL)  LIPID PANEL      Component Value Range   Cholesterol 214 (*) 0 - 200 (mg/dL)   Triglycerides 272  <536 (mg/dL)   HDL 53  >64 (mg/dL)   Total CHOL/HDL Ratio 4.0     VLDL 22  0 - 40 (mg/dL)   LDL Cholesterol 403 (*) 0 - 99 (mg/dL)  SALICYLATE LEVEL      Component Value Range   Salicylate Lvl <2.0 (*) 2.8 - 20.0 (mg/dL)  POCT PREGNANCY, URINE      Component Value Range   Preg Test, Ur NEGATIVE  NEGATIVE    POCT I-STAT, CHEM 8      Component Value Range   Sodium 140  135 - 145 (mEq/L)   Potassium 3.6  3.5 - 5.1 (mEq/L)   Chloride 106  96 - 112 (mEq/L)   BUN 5 (*) 6 - 23 (mg/dL)   Creatinine, Ser 4.74  0.50 - 1.10 (mg/dL)   Glucose, Bld 259 (*) 70 - 99 (mg/dL)   Calcium, Ion 5.63  8.75 - 1.32 (mmol/L)   TCO2 22  0 - 100 (mmol/L)   Hemoglobin 14.3  12.0 - 15.0 (g/dL)   HCT 64.3  32.9 - 51.8 (%)   Mr Laqueta Jean Wo Contrast  07/30/2011  *RADIOLOGY REPORT*  Clinical Data: Meningioma resection 2011.  History of TIA with right-sided numbness prior to resection.  History of seizure after resection.  New left-sided facial, left arm and leg numbness with visual problems and slurred speech over  past 3 weeks.  MRI HEAD WITHOUT AND WITH CONTRAST  Technique:  Multiplanar, multiecho pulse sequences of the brain and surrounding structures were obtained according to standard protocol without and with intravenous contrast  Contrast: 13mL MULTIHANCE GADOBENATE DIMEGLUMINE 529 MG/ML IV SOLN  Comparison: 07/06/2011 CT.  01/16/2011 MR.  Findings: No acute infarct.  No intracranial hemorrhage.  Prior left frontal craniotomy for resection of meningioma.  No evidence of recurrence.  Small areas of encephalomalacia adjacent to the craniotomy site once again noted.  Slight progression of minimal nonspecific white matter type changes frontal lobes.  Major intracranial vascular structures are patent.  IMPRESSION: No acute infarct.  Prior left frontal craniotomy for resection of meningioma.  No evidence of recurrence.  Small areas of encephalomalacia adjacent to the craniotomy site once again noted.  Slight progression of minimal nonspecific white matter type changes frontal lobes.  Original Report Authenticated By: Fuller Canada, M.D.        Thomasene Lot, PA-C 08/13/11 1150

## 2011-08-13 NOTE — ED Provider Notes (Signed)
I was available throughout the CDU portion of the patient's case.  Gerhard Munch, MD 08/13/11 [phone]

## 2011-08-13 NOTE — Discharge Instructions (Signed)
Benign Positional Vertigo Vertigo means you feel like you or your surroundings are moving when they are not. Benign positional vertigo is the most common form of vertigo. Benign means that the cause of your condition is not serious. Benign positional vertigo is more common in older adults. CAUSES  Benign positional vertigo is the result of an upset in the labyrinth system. This is an area in the middle ear that helps control your balance. This may be caused by a viral infection, head injury, or repetitive motion. However, often no specific cause is found. SYMPTOMS  Symptoms of benign positional vertigo occur when you move your head or eyes in different directions. Some of the symptoms may include:  Loss of balance and falls.   Vomiting.   Blurred vision.   Dizziness.   Nausea.   Involuntary eye movements (nystagmus).  DIAGNOSIS  Benign positional vertigo is usually diagnosed by physical exam. If the specific cause of your benign positional vertigo is unknown, your caregiver may perform imaging tests, such as magnetic resonance imaging (MRI) or computed tomography (CT). TREATMENT  Your caregiver may recommend movements or procedures to correct the benign positional vertigo. Medicines such as meclizine, benzodiazepines, and medicines for nausea may be used to treat your symptoms. In rare cases, if your symptoms are caused by certain conditions that affect the inner ear, you may need surgery. HOME CARE INSTRUCTIONS   Follow your caregiver's instructions.   Move slowly. Do not make sudden body or head movements.   Avoid driving.   Avoid operating heavy machinery.   Avoid performing any tasks that would be dangerous to you or others during a vertigo episode.   Drink enough fluids to keep your urine clear or pale yellow.  SEEK IMMEDIATE MEDICAL CARE IF:   You develop problems with walking, weakness, numbness, or using your arms, hands, or legs.   You have difficulty speaking.   You  develop severe headaches.   Your nausea or vomiting continues or gets worse.   You develop visual changes.   Your family or friends notice any behavioral changes.   Your condition gets worse.   You have a fever.   You develop a stiff neck or sensitivity to light.  MAKE SURE YOU:   Understand these instructions.   Will watch your condition.   Will get help right away if you are not doing well or get worse.  Document Released: 12/04/2005 Document Revised: 02/15/2011 Document Reviewed: 11/16/2010 [facility] Patient Information 2012 [place], [place].Near-Syncope Near-syncope is sudden weakness, dizziness, or feeling like you might pass out (faint). This may occur when getting up after sitting or while standing for a long period of time. Near-syncope can be caused by a drop in blood pressure. This is a common reaction, but it may occur to a greater degree in people taking medicines to control their blood pressure. Fainting often occurs when the blood pressure or pulse is too low to provide enough blood flow to the brain to keep you conscious. Fainting and near-syncope are not usually due to serious medical problems. However, certain people should be more cautious in the event of near-syncope, including elderly patients, patients with diabetes, and patients with a history of heart conditions (especially irregular rhythms).  CAUSES   Drop in blood pressure.   Physical pain.   Dehydration.   Heat exhaustion.   Emotional distress.   Low blood sugar.   Internal bleeding.   Heart and circulatory problems.   Infections.  SYMPTOMS   Dizziness.  Feeling sick to your stomach (nauseous).   Nearly fainting.   Body numbness.   Turning pale.   Tunnel vision.   Weakness.  HOME CARE INSTRUCTIONS   Lie down right away if you start feeling like you might faint. Breathe deeply and steadily. Wait until all the symptoms have passed. Most of these episodes last only a few minutes. You  may feel tired for several hours.   Drink enough fluids to keep your urine clear or pale yellow.   If you are taking blood pressure or heart medicine, get up slowly, taking several minutes to sit and then stand. This can reduce dizziness that is caused by a drop in blood pressure.  SEEK IMMEDIATE MEDICAL CARE IF:   You have a severe headache.   Unusual pain develops in the chest, abdomen, or back.   There is bleeding from the mouth or rectum, or you have black or tarry stool.   An irregular heartbeat or a very rapid pulse develops.   You have repeated fainting or seizure-like jerking during an episode.   You faint when sitting or lying down.   You develop confusion.   You have difficulty walking.   Severe weakness develops.   Vision problems develop.  MAKE SURE YOU:   Understand these instructions.   Will watch your condition.   Will get help right away if you are not doing well or get worse.  Document Released: 02/26/2005 Document Revised: 02/15/2011 Document Reviewed: 04/14/2010 [facility] Patient Information 2012 [place], [place].

## 2011-08-13 NOTE — ED Provider Notes (Signed)
Medical screening examination/treatment/procedure(s) were performed by non-physician practitioner and as supervising physician I was immediately available for consultation/collaboration.  Lorry Furber, MD 08/13/11 0945 

## 2011-08-13 NOTE — ED Notes (Signed)
Patient transported to Ultrasound 

## 2011-08-13 NOTE — ED Provider Notes (Signed)
History     CSN: [id number]  Arrival date & time 08/13/11  0254   First MD Initiated Contact with Patient 08/13/11 [phone]      Chief Complaint  Patient presents with  . Abdominal Pain    (Consider location/radiation/quality/duration/timing/severity/associated sxs/prior treatment) HPI Comments: Melanie [name] who presents with a complaint of ongoing near-syncope. She states that over the last 3 weeks she has had a persistent feeling of feeling like she is going to pass out. She states this all started when she presented with headaches and paresthesias with slurred speech and was found in the emergency department to have a fairly normal workup, was offered observational protocol for transient ischemic attack but refused at that time. Her symptoms resolved quickly but since that time has had ongoing poor appetite, lightheadedness and dizziness feeling like she has intermittent vertigo. Most of the time her symptoms occur when she stands up.  She denies chest pain, cough, shortness of breath, back pain, dysuria, diarrhea, rashes. She does admit to having persistent changes in her vision though she denies that this is blurry vision. She describes this as feeling like her vision was going to black out and this usually occurs when she stands. She also admits to taking aspirin, up to 14 or more tablets a day. These are 81 mg tablets, she takes these and states that her symptoms resolved within 30-45 minutes after taking a 2 tablets.  She denies changes in her hearing or tinnitus. She denies changes in the color of her stools. She does admit to having mild nausea. He has been evaluated very thoroughly with CT scans and MRIs of the brain showing no new lesions or findings. She has a history of resection of a meningioma from the frontal area of her skull. She has been referred to both neurology and cardiology and has not seen either. She does admit that in the last 2-3 days she has had a periumbilical pain that  is mild and achy.  The history is provided by the patient and medical records.    Past Medical History  Diagnosis Date  . TIA (transient ischemic attack)   . Seizures   . Brain tumor 2011  . Embolism - blood clot in left ovary    Past Surgical History  Procedure Date  . Brain tumor removed 2011    Family History  Problem Relation Age of Onset  . Depression Mother   . Alcohol abuse Sister   . Alcohol abuse Brother   . Alzheimer's disease Maternal Grandmother   . Heart failure Father   . Hypertension Father   . Diabetes Maternal Grandmother   . Stroke Maternal Grandmother     History  Substance Use Topics  . Smoking status: Not on file  . Smokeless tobacco: Not on file  . Alcohol Use: No    OB History    Grav Para Term Preterm Abortions TAB SAB Ect Mult Living                  Review of Systems  All other systems reviewed and are negative.    Allergies  Review of patient's allergies indicates no known allergies.  Home Medications   Current Outpatient Rx  Name Route Sig Dispense Refill  . ASPIRIN EC 81 MG PO TBEC Oral Take 162 mg by mouth every 4 (four) hours. Every 4 hours a day for the last week or so    . DIMENHYDRINATE 50 MG PO TABS Oral Take  50 mg by mouth every 8 (eight) hours as needed. For dizziness      BP 121/90  Pulse 89  Temp(Src) 98.5 F (36.9 C) (Oral)  Resp 24  SpO2 98%  LMP 07/08/2011  Physical Exam  Nursing note and vitals reviewed. Constitutional: She appears well-developed and well-nourished. No distress.  HENT:  Head: Normocephalic and atraumatic.  Mouth/Throat: Oropharynx is clear and moist. No oropharyngeal exudate.       Tympanic membranes are clear bilaterally oropharynx is clear  Eyes: Conjunctivae and EOM are normal. Pupils are equal, round, and reactive to light. Right eye exhibits no discharge. Left eye exhibits no discharge. No scleral icterus.  Neck: Normal range of motion. Neck supple. No JVD present. No  thyromegaly present.  Cardiovascular: Normal rate, regular rhythm, normal heart sounds and intact distal pulses.  Exam reveals no gallop and no friction rub.   No murmur heard. Pulmonary/Chest: Effort normal and breath sounds normal. No respiratory distress. She has no wheezes. She has no rales.  Abdominal: Soft. Bowel sounds are normal. She exhibits no distension and no mass. There is no tenderness.       No abdominal tenderness or guarding  Musculoskeletal: Normal range of motion. She exhibits no edema and no tenderness.  Lymphadenopathy:    She has no cervical adenopathy.  Neurological: She is alert. Coordination normal.       Speech is clear, gross visual acuity is normal, cranial nerves III through XII are normal, normal peripheral visual fields, normal sensation and motor to the muscles of the face, normal extraocular movements, normal pupillary exam, normal sensation to light touch and pinprick of the bilateral upper and lower extremities, normal strength of the upper and lower extremities, no truncal ataxia, no pronator drift. Normal reflexes at the brachial radialis, biceps, patellar tendons bilaterally  Skin: Skin is warm and dry. No rash noted. No erythema.  Psychiatric: Her behavior is normal.       Anxious appearing and tearful    ED Course  Procedures (including critical care time)  ED ECG REPORT   Date: 08/13/2011   Rate: 79  Rhythm: normal sinus rhythm  QRS Axis: normal  Intervals: normal  ST/T Wave abnormalities: normal  Conduction Disutrbances:none  Narrative Interpretation:   Old EKG Reviewed: Compared with EKG from 04/07/2001, rate significantly slower    Labs Reviewed  CBC  PROTIME-INR  APTT  URINALYSIS, ROUTINE W REFLEX MICROSCOPIC  LIPID PANEL  HEMOGLOBIN A1C   No results found.   No diagnosis found.    MDM  The patient has several complaints tonight,  #1 near syncope, the patient relates that a persistent feeling of near syncope over the last 3  weeks. This could be related to orthostatic hypotension, dehydration and much less likely to be an intracranial lesion given her normal MRI. She will need to have an ultrasound of her carotid arteries as well as an echocardiogram to rule out obstructive lesions.  Orthostatic vital signs ordered  #2 abdominal pain, this could potentially be related to the aspirin that she has been using an increased fashion. I have recommended to her that she immediately stopped taking that much aspirin, will give her a GI cocktail and start Pepcid, aspirin level ordered though according to the amount of medication that she claims to be taking she should not be toxic.  #3 anxiety, the patient is to be having significant anxiety. Review of the medical records shows that she was not diagnosed with a transient ischemic attack  but thought that her symptoms were much less likely ischemic and more likely to be related to migraine. This will be communicated to the patient after all of the rest of her tests have been returned.     Labs overall negative - no acute findings - ECG unremarkable - TIA w/u to be completed in CDU.    Change of shift - care signed out to Dr. Fonnie Jarvis.  Vida Roller, MD 08/13/11 [phone]

## 2011-08-13 NOTE — Progress Notes (Signed)
  Echocardiogram 2D Echocardiogram has been performed.  Melanie [name] [facility] 08/13/2011, 8:16 AM

## 2011-08-14 ENCOUNTER — Encounter: Payer: Self-pay | Admitting: Family Medicine

## 2011-08-14 DIAGNOSIS — D329 Benign neoplasm of meninges, unspecified: Secondary | ICD-10-CM

## 2011-08-14 HISTORY — DX: Benign neoplasm of meninges, unspecified: D32.9

## 2011-08-15 ENCOUNTER — Ambulatory Visit (INDEPENDENT_AMBULATORY_CARE_PROVIDER_SITE_OTHER): Payer: Self-pay | Admitting: Family Medicine

## 2011-08-15 ENCOUNTER — Encounter: Payer: Self-pay | Admitting: Family Medicine

## 2011-08-15 VITALS — BP 128/87 | HR 92 | Temp 98.2°F | Ht 60.75 in | Wt 145.0 lb

## 2011-08-15 DIAGNOSIS — R42 Dizziness and giddiness: Secondary | ICD-10-CM

## 2011-08-15 DIAGNOSIS — R5383 Other fatigue: Secondary | ICD-10-CM

## 2011-08-15 DIAGNOSIS — D329 Benign neoplasm of meninges, unspecified: Secondary | ICD-10-CM

## 2011-08-15 DIAGNOSIS — R5381 Other malaise: Secondary | ICD-10-CM

## 2011-08-15 DIAGNOSIS — G4700 Insomnia, unspecified: Secondary | ICD-10-CM

## 2011-08-15 DIAGNOSIS — D320 Benign neoplasm of cerebral meninges: Secondary | ICD-10-CM

## 2011-08-15 LAB — CBC WITH DIFFERENTIAL/PLATELET
Basophils Absolute: 0 10*3/uL (ref 0.0–0.1)
Basophils Relative: 0 % (ref 0–1)
MCHC: 33.8 g/dL (ref 30.0–36.0)
Monocytes Absolute: 0.6 10*3/uL (ref 0.1–1.0)
Neutro Abs: 4.7 10*3/uL (ref 1.7–7.7)
Neutrophils Relative %: 67 % (ref 43–77)
RDW: 14.3 % (ref 11.5–15.5)

## 2011-08-15 MED ORDER — ERGOCALCIFEROL 1.25 MG (50000 UT) PO CAPS: 50000.0000 [IU] | ORAL_CAPSULE | ORAL | Status: DC

## 2011-08-15 MED ORDER — HYDROXYZINE HCL 25 MG PO TABS: 25.0000 mg | ORAL_TABLET | Freq: Three times a day (TID) | ORAL | Status: AC | PRN

## 2011-08-15 MED ORDER — FLUTICASONE PROPIONATE 50 MCG/ACT NA SUSP: 2.0000 | Freq: Every day | NASAL | Status: DC

## 2011-08-15 NOTE — Progress Notes (Signed)
  Subjective:    Patient ID: Melanie [name], female    DOB: [birth year], 36 y.o.   MRN: [id number]  HPI 36 year old female here to establish care. Patient has been seen in the emergency department multiple times for dizziness and lightheadedness. Patient has a significant past medical history consistent with a meningioma that is status post craniotomy approximately 2 years ago. Patient is followed up for that and has been fine with no recurrence. Patient has recently had an MRI showed some postsurgical changes otherwise no significant changes except for nonspecific white matter changes in the frontal lobe. Patient denies any loss of consciousness any numbness or tingling in the extremities. Patient has never had this findings before and is becoming very frustrated that we have not found anything medically to account for her symptoms.  Past Medical History  Diagnosis Date  . TIA (transient ischemic attack)   . Seizures   . Brain tumor 2011  . Embolism - blood clot in left ovary  . Meningioma 08/14/2011    S/p craniotomy Encephaloma present non changing on MRI in 08/2011   Past Surgical History  Procedure Date  . Brain tumor removed 2011   Family History  Problem Relation Age of Onset  . Depression Mother   . Alcohol abuse Sister   . Alcohol abuse Brother   . Alzheimer's disease Maternal Grandmother   . Heart failure Father   . Hypertension Father   . Diabetes Maternal Grandmother   . Stroke Maternal Grandmother    History   Social History  . Marital Status: Divorced    Spouse Name: N/A    Number of Children: N/A  . Years of Education: N/A   Occupational History  . Not on file.   Social History Main Topics  . Smoking status: Never Smoker   . Smokeless tobacco: Not on file  . Alcohol Use: No  . Drug Use: No  . Sexually Active: Not on file   Other Topics Concern  . Not on file   Social History Narrative  . No narrative on file     Review of Systems Patient states  that she has lost weight approximately 8 pounds in the last 4 weeks. Patient is also felt very fatigued and is having no appetite. Patient also states she's had some hair loss and is very constipated. Patient denies any fever, chills, shortness of breath, chest pain or abdominal pain.    Objective:   Physical Exam Vitals reviewed.  General: No apparent distress HEENT: Patient does have tympanic membranes that does have some peripheral sclerosis changes with an air-fluid level. Patient's turbinates are erythemic and edematous patient does have a significant postnasal drip. Thyroid is palpable but no nodules felt. Cardiovascular: Regular in rhythm no murmur appreciated carotid bruits none Pulmonary: Clear to auscultation bilaterally Abdomen: Thousand positive nontender nondistended Extremities: 2+ pulses neurovascularly intact no edema       Assessment & Plan:

## 2011-08-15 NOTE — Assessment & Plan Note (Signed)
I do think this might be allergic rhinitis and fluid on patient's year which is causing compression of the cranial nerve 8. Will try Flonase and hydroxyzine. If she continues to have, she will return to consider doing aPHQ9 2 rule out depression.

## 2011-08-15 NOTE — Patient Instructions (Signed)
Very nice to meet you. I'm giving you to new medications. One is a nose spray. I when she to use one spray each nostril twice daily. I'm also giving you a medicine called hydroxyzine. You can take this medicine would like dizzy, or need for sleep. This medicine you can take up to 3 times a day but it might make you sleepy. I will get labs and call you with the results. I like you to followup at the scheduled appointment with me and we will do a Pap smear at that time.

## 2011-08-15 NOTE — Assessment & Plan Note (Signed)
Patient has had no loss of consciousness and has more likely presyncopal events. Patient says these events occur at different times throughout the day. Patient has not noticed any type of relationship to food positioning or time of day. Patient was concerned mostly of her thyroid. We'll get labs and monitor Patient told to keep journal of any more of these symptoms Patient will followup in 3 weeks I do think this might be allergic rhinitis and fluid on patient's year which is causing compression of the cranial nerve 8. Will try Flonase and hydroxyzine. If she continues to have, she will return to consider doing aPHQ9 2 rule out depression.

## 2011-08-16 ENCOUNTER — Telehealth: Payer: Self-pay | Admitting: Family Medicine

## 2011-08-16 LAB — T4, FREE: Free T4: 1.14 ng/dL (ref 0.80–1.80)

## 2011-08-16 LAB — TSH: TSH: 0.716 u[IU]/mL (ref 0.350–4.500)

## 2011-08-16 NOTE — Telephone Encounter (Signed)
Open in error

## 2011-08-17 ENCOUNTER — Emergency Department (HOSPITAL_COMMUNITY)
Admission: EM | Admit: 2011-08-17 | Discharge: 2011-08-18 | Disposition: A | Discharge status: Home / Self Care | Payer: Self-pay | Attending: Emergency Medicine | Admitting: Emergency Medicine

## 2011-08-17 ENCOUNTER — Encounter (HOSPITAL_COMMUNITY): Payer: Self-pay | Admitting: *Deleted

## 2011-08-17 ENCOUNTER — Emergency Department (HOSPITAL_COMMUNITY): Payer: Self-pay

## 2011-08-17 DIAGNOSIS — Z9889 Other specified postprocedural states: Secondary | ICD-10-CM | POA: Insufficient documentation

## 2011-08-17 DIAGNOSIS — J029 Acute pharyngitis, unspecified: Secondary | ICD-10-CM | POA: Insufficient documentation

## 2011-08-17 DIAGNOSIS — H539 Unspecified visual disturbance: Secondary | ICD-10-CM | POA: Insufficient documentation

## 2011-08-17 DIAGNOSIS — IMO0001 Reserved for inherently not codable concepts without codable children: Secondary | ICD-10-CM | POA: Insufficient documentation

## 2011-08-17 DIAGNOSIS — R51 Headache: Secondary | ICD-10-CM | POA: Insufficient documentation

## 2011-08-17 DIAGNOSIS — R5381 Other malaise: Secondary | ICD-10-CM | POA: Insufficient documentation

## 2011-08-17 DIAGNOSIS — R5383 Other fatigue: Secondary | ICD-10-CM

## 2011-08-17 DIAGNOSIS — R10819 Abdominal tenderness, unspecified site: Secondary | ICD-10-CM | POA: Insufficient documentation

## 2011-08-17 LAB — POCT I-STAT, CHEM 8
Glucose, Bld: 93 mg/dL (ref 70–99)
HCT: 38 % (ref 36.0–46.0)
Hemoglobin: 12.9 g/dL (ref 12.0–15.0)
Potassium: 3.8 mEq/L (ref 3.5–5.1)
TCO2: 22 mmol/L (ref 0–100)

## 2011-08-17 NOTE — ED Notes (Addendum)
"  I came in tonight b/c I feel like I'm going to have a stroke.  I have h/a to LT side, numbness to LT cheek, loosing vision to LT eye.  I have been having chronic fatigue and loss of appetite for last 3 weeks. I've lost 15 lbs in 3 wks.  I haven't been completely honest w/you all.  I have been taking 3 laxatives/day x 2 months, diet pills for 3 months BID and giving plasma twice a week."   Pt took total of 4 aspirin today.  She currently doesn't have any of the numbness or visual issues, just 5/10 pain to LT side of head.  States 2 days ago her PMD said she had a lot of fluid in her LT ear.   Pt also says she makes herself eat since has no appetite b/c if she doesn't eat she will pass out.

## 2011-08-17 NOTE — ED Provider Notes (Signed)
History     CSN: [id number]  Arrival date & time 08/17/11  1822   None     Chief Complaint  Patient presents with  . Illness    "thinks she needs B12 injection,  has been taking excessive laxitives to loose weight."  "feels in shock"  pt is rocking back and forth, talking incessently    (Consider location/radiation/quality/duration/timing/severity/associated sxs/prior treatment) HPI Comments: Patient was 3, weeks of symptoms, which include, myalgias, ear congestion, weight loss, fatigue , and dizziness she has been seen several times in the emergency department, as well as by her primary care physician and is very frustrated because nobody can find out what is wrong with her.  She reports abuse of tenderness.  Severe dieting diet pills, that she stopped in the last 3, weeks.  She states tonight, that she thinks she needs a shot of B12, sure it's a reports, left-sided headache, left eye blurry vision, as well as her left eye feeling weak  Patient is a 36 y.o. female presenting with general illness. The history is provided by the patient.  Illness  The current episode started more than 2 weeks ago. The onset was gradual. The problem has been gradually worsening. The problem is severe. The symptoms are relieved by nothing. The symptoms are aggravated by nothing. Associated symptoms include decreased vision, headaches and sore throat. Pertinent negatives include no fever, no double vision, no eye itching, no abdominal pain, no constipation, no diarrhea, no nausea, no vomiting, no congestion, no ear discharge, no ear pain, no hearing loss, no mouth sores, no rhinorrhea, no swollen glands, no muscle aches, no cough, no wheezing, no eye discharge and no eye pain. She has been experiencing a mild sore throat. She has been drinking less than usual and eating less than usual.    Past Medical History  Diagnosis Date  . TIA (transient ischemic attack)   . Seizures   . Brain tumor 2011  . Embolism -  blood clot in left ovary  . Meningioma 08/14/2011    S/p craniotomy Encephaloma present non changing on MRI in 08/2011    Past Surgical History  Procedure Date  . Brain tumor removed 2011    Family History  Problem Relation Age of Onset  . Depression Mother   . Alcohol abuse Sister   . Alcohol abuse Brother   . Alzheimer's disease Maternal Grandmother   . Heart failure Father   . Hypertension Father   . Diabetes Maternal Grandmother   . Stroke Maternal Grandmother     History  Substance Use Topics  . Smoking status: Never Smoker   . Smokeless tobacco: Not on file  . Alcohol Use: No    OB History    Grav Para Term Preterm Abortions TAB SAB Ect Mult Living                  Review of Systems  Constitutional: Negative for fever.  HENT: Positive for sore throat. Negative for hearing loss, ear pain, congestion, rhinorrhea, mouth sores and ear discharge.   Eyes: Negative for double vision, pain, discharge and itching.  Respiratory: Negative for cough, chest tightness and wheezing.   Cardiovascular: Negative for chest pain, palpitations and leg swelling.  Gastrointestinal: Negative for nausea, vomiting, abdominal pain, diarrhea, constipation and blood in stool.  Genitourinary: Negative for dysuria, flank pain, vaginal discharge and pelvic pain.  Musculoskeletal: Positive for myalgias.  Skin: Negative for pallor.  Neurological: Positive for weakness and headaches. Negative for dizziness,  syncope, speech difficulty and numbness.    Allergies  Review of patient's allergies indicates no known allergies.  Home Medications   Current Outpatient Rx  Name Route Sig Dispense Refill  . ASPIRIN EC 81 MG PO TBEC Oral Take 162 mg by mouth every 4 (four) hours. Every 4 hours a day for the last week or so    . DIMENHYDRINATE 50 MG PO TABS Oral Take 50 mg by mouth every 8 (eight) hours as needed. For dizziness    . ERGOCALCIFEROL 50000 UNITS PO CAPS Oral Take 1 capsule (50,000 Units  total) by mouth once a week. 12 capsule 1  . FLUTICASONE PROPIONATE 50 MCG/ACT NA SUSP Nasal Place 2 sprays into the nose daily. 16 g 6  . HYDROXYZINE HCL 25 MG PO TABS Oral Take 1 tablet (25 mg total) by mouth every 8 (eight) hours as needed for anxiety (sleep). 90 tablet 0  . MECLIZINE HCL 12.5 MG PO TABS Oral Take 12.5 mg by mouth 3 (three) times daily as needed. For dizziness      BP 110/67  Pulse 68  Temp 98.2 F (36.8 C)  Resp 16  SpO2 100%  LMP 08/16/2011  Physical Exam  Constitutional: She is oriented to person, place, and time. She appears well-developed and well-nourished.  HENT:  Head: Normocephalic.  Eyes: Pupils are equal, round, and reactive to light. Right eye exhibits no discharge. Left eye exhibits no discharge.  Neck: Normal range of motion.  Cardiovascular: Normal rate.   Pulmonary/Chest: Effort normal.  Abdominal: Soft. She exhibits no distension. There is tenderness.       Patient points to a rib and states that this is tender when she palpates the skin over it  Musculoskeletal: Normal range of motion.  Neurological: She is alert and oriented to person, place, and time. No cranial nerve deficit.  Skin: Skin is warm and dry.    ED Course  Procedures (including critical care time)  Labs Reviewed  COMPREHENSIVE METABOLIC PANEL - Abnormal; Notable for the following:    Total Bilirubin 0.2 (*)    All other components within normal limits  URINALYSIS, ROUTINE W REFLEX MICROSCOPIC - Abnormal; Notable for the following:    Ketones, ur 40 (*)    All other components within normal limits  POCT I-STAT, CHEM 8  CBC  D-DIMER, QUANTITATIVE  URINE RAPID DRUG SCREEN (HOSP PERFORMED)  LIPASE, BLOOD  MONONUCLEOSIS SCREEN  POCT PREGNANCY, URINE  VITAMIN B12   Ct Head Wo Contrast  08/18/2011  *RADIOLOGY REPORT*  Clinical Data: Left eye visual changes, history of surgery for meningioma  CT HEAD WITHOUT CONTRAST  Technique:  Contiguous axial images were obtained from  the base of the skull through the vertex without contrast.  Comparison: MRI brain dated 07/30/2011  Findings: No evidence of parenchymal hemorrhage or extra-axial fluid collection. No mass lesion, mass effect, or midline shift.  No CT evidence of acute infarction.  Cerebral volume is age appropriate.  No ventriculomegaly.  Prior left frontoparietal craniotomy.  The visualized paranasal sinuses are essentially clear. The mastoid air cells are unopacified.  No evidence of calvarial fracture.  IMPRESSION: No evidence of acute intracranial abnormality.  Prior left frontoparietal craniotomy.  Original Report Authenticated By: Charline Bills, M.D.     1. Fatigue    ABCD score 2   MDM  This patient has had numerous workups for the same complaint.  I feel that most of her symptoms are supratentorial and related to anxiety.  I  did draw a vitamin B12 level, but this was not resulted.  Encourage the patient to followup with her primary care provider at family practice for this result        Arman Filter, NP 08/18/11 1610  Arman Filter, NP 08/18/11 9604  Arman Filter, NP 08/18/11 [phone]

## 2011-08-18 LAB — COMPREHENSIVE METABOLIC PANEL
ALT: <5 U/L (ref 0–35)
AST: 11 U/L (ref 0–37)
Albumin: 3.6 g/dL (ref 3.5–5.2)
CO2: 23 mEq/L (ref 19–32)
Calcium: 9.1 mg/dL (ref 8.4–10.5)
GFR calc non Af Amer: >90 mL/min (ref >90)
Sodium: 136 mEq/L (ref 135–145)

## 2011-08-18 LAB — URINALYSIS, ROUTINE W REFLEX MICROSCOPIC
Glucose, UA: NEGATIVE mg/dL
Ketones, ur: 40 mg/dL — AB
Leukocytes, UA: NEGATIVE
Nitrite: NEGATIVE
Specific Gravity, Urine: 1.017 (ref 1.005–1.030)
pH: 5.5 (ref 5.0–8.0)

## 2011-08-18 LAB — CBC
MCH: 27.5 pg (ref 26.0–34.0)
Platelets: 255 10*3/uL (ref 150–400)
RBC: 4.54 MIL/uL (ref 3.87–5.11)
RDW: 13.2 % (ref 11.5–15.5)
WBC: 5.9 10*3/uL (ref 4.0–10.5)

## 2011-08-18 LAB — D-DIMER, QUANTITATIVE: D-Dimer, Quant: 0.22 ug/mL-FEU (ref 0.00–0.48)

## 2011-08-18 LAB — RAPID URINE DRUG SCREEN, HOSP PERFORMED
Amphetamines: NOT DETECTED
Benzodiazepines: NOT DETECTED
Cocaine: NOT DETECTED
Opiates: NOT DETECTED

## 2011-08-18 NOTE — Discharge Instructions (Signed)
Tonights  labs are essentially normal.  You have been ruled out for mononucleosis, pulmonary embolus, CVA, anemia, urinary tract infection.  A vitamin B 12 level has been drawn, but not will be resulted for several days.  Please make an appointment with Dr. Katrinka Blazing for followup on that result.

## 2011-08-18 NOTE — ED Notes (Signed)
NT ambulated pt, pt stated she is just fine, no dizziness, no nausea.

## 2011-08-18 NOTE — ED Provider Notes (Signed)
Medical screening examination/treatment/procedure(s) were performed by non-physician practitioner and as supervising physician I was immediately available for consultation/collaboration.   Hanley Seamen, MD 08/18/11 [phone]

## 2011-09-05 ENCOUNTER — Ambulatory Visit: Payer: Self-pay | Admitting: Family Medicine

## 2011-09-10 ENCOUNTER — Emergency Department (HOSPITAL_COMMUNITY)
Admission: EM | Admit: 2011-09-10 | Discharge: 2011-09-10 | Disposition: A | Discharge status: Home / Self Care | Payer: Medicare Other | Attending: Emergency Medicine | Admitting: Emergency Medicine

## 2011-09-10 ENCOUNTER — Encounter (HOSPITAL_COMMUNITY): Payer: Self-pay | Admitting: *Deleted

## 2011-09-10 DIAGNOSIS — Z7982 Long term (current) use of aspirin: Secondary | ICD-10-CM | POA: Insufficient documentation

## 2011-09-10 DIAGNOSIS — R42 Dizziness and giddiness: Secondary | ICD-10-CM | POA: Insufficient documentation

## 2011-09-10 DIAGNOSIS — R5383 Other fatigue: Secondary | ICD-10-CM | POA: Insufficient documentation

## 2011-09-10 DIAGNOSIS — R5381 Other malaise: Secondary | ICD-10-CM | POA: Insufficient documentation

## 2011-09-10 DIAGNOSIS — G9389 Other specified disorders of brain: Secondary | ICD-10-CM | POA: Insufficient documentation

## 2011-09-10 DIAGNOSIS — Z8673 Personal history of transient ischemic attack (TIA), and cerebral infarction without residual deficits: Secondary | ICD-10-CM | POA: Insufficient documentation

## 2011-09-10 DIAGNOSIS — R51 Headache: Secondary | ICD-10-CM | POA: Insufficient documentation

## 2011-09-10 DIAGNOSIS — R531 Weakness: Secondary | ICD-10-CM

## 2011-09-10 LAB — URINALYSIS, ROUTINE W REFLEX MICROSCOPIC
Bilirubin Urine: NEGATIVE
Ketones, ur: 15 mg/dL — AB
Leukocytes, UA: NEGATIVE
Nitrite: NEGATIVE
Urobilinogen, UA: 0.2 mg/dL (ref 0.0–1.0)

## 2011-09-10 LAB — CBC WITH DIFFERENTIAL/PLATELET
Eosinophils Absolute: 0.2 10*3/uL (ref 0.0–0.7)
Eosinophils Relative: 2 % (ref 0–5)
Hemoglobin: 13.6 g/dL (ref 12.0–15.0)
Lymphocytes Relative: 25 % (ref 12–46)
Lymphs Abs: 2.3 10*3/uL (ref 0.7–4.0)
MCH: 28.3 pg (ref 26.0–34.0)
MCV: 82.7 fL (ref 78.0–100.0)
Monocytes Relative: 7 % (ref 3–12)
Platelets: 273 10*3/uL (ref 150–400)
RBC: 4.81 MIL/uL (ref 3.87–5.11)
WBC: 9 10*3/uL (ref 4.0–10.5)

## 2011-09-10 LAB — COMPREHENSIVE METABOLIC PANEL
ALT: 7 U/L (ref 0–35)
Alkaline Phosphatase: 58 U/L (ref 39–117)
BUN: 16 mg/dL (ref 6–23)
CO2: 24 mEq/L (ref 19–32)
Calcium: 9.4 mg/dL (ref 8.4–10.5)
GFR calc Af Amer: >90 mL/min (ref >90)
GFR calc non Af Amer: >90 mL/min (ref >90)
Glucose, Bld: 86 mg/dL (ref 70–99)
Potassium: 3.5 mEq/L (ref 3.5–5.1)
Sodium: 141 mEq/L (ref 135–145)
Total Protein: 7.7 g/dL (ref 6.0–8.3)

## 2011-09-10 NOTE — ED Notes (Signed)
The pt walked out and left approx 15 minutes  After she arrived.  She told other patients she was leaving.  She returned a few minutes ago

## 2011-09-10 NOTE — ED Notes (Signed)
The patient is not in her room.

## 2011-09-10 NOTE — ED Notes (Signed)
Greeted patient and advised I would be back shortly begin my portion of her treatment.

## 2011-09-10 NOTE — ED Provider Notes (Signed)
History     CSN: [id number]  Arrival date & time 09/10/11  1715   First MD Initiated Contact with Patient 09/10/11 2138      Chief Complaint  Patient presents with  . Headache    (Consider location/radiation/quality/duration/timing/severity/associated sxs/prior treatment) HPI Comments: Pt is a 36yo female who presents with CC of light headiness, weakness. States she has had these symptoms for 10 months. States she has seen multiple doctors, has been evaluated here for the same. Pt states symptoms persist. States feels light headed from the time she wakes up to the time she goes to bed. States she is not having any fever, chills, headache, neck pain, chest pain, abdominal pain. Today had an episode of diarrhea. Pt has an appointment with neurology tomorrow and pcp on Wednesday.    Past Medical History  Diagnosis Date  . TIA (transient ischemic attack)   . Seizures   . Brain tumor 2011  . Embolism - blood clot in left ovary  . Meningioma 08/14/2011    S/p craniotomy Encephaloma present non changing on MRI in 08/2011    Past Surgical History  Procedure Date  . Brain tumor removed 2011    Family History  Problem Relation Age of Onset  . Depression Mother   . Alcohol abuse Sister   . Alcohol abuse Brother   . Alzheimer's disease Maternal Grandmother   . Heart failure Father   . Hypertension Father   . Diabetes Maternal Grandmother   . Stroke Maternal Grandmother     History  Substance Use Topics  . Smoking status: Never Smoker   . Smokeless tobacco: Not on file  . Alcohol Use: No    OB History    Grav Para Term Preterm Abortions TAB SAB Ect Mult Living                  Review of Systems  Constitutional: Positive for activity change, appetite change and fatigue. Negative for fever and chills.  HENT: Negative for neck pain and neck stiffness.   Eyes: Negative for photophobia, pain and visual disturbance.  Respiratory: Negative.   Cardiovascular: Negative.    Gastrointestinal: Positive for diarrhea. Negative for nausea, vomiting and abdominal pain.  Genitourinary: Negative for dysuria.  Musculoskeletal: Negative for myalgias.  Skin: Negative.   Neurological: Positive for dizziness, weakness and light-headedness. Negative for headaches.    Allergies  Review of patient's allergies indicates no known allergies.  Home Medications   Current Outpatient Rx  Name Route Sig Dispense Refill  . ASPIRIN EC 81 MG PO TBEC Oral Take 162 mg by mouth every 4 (four) hours. Every 4 hours a day for the last week or so    . MECLIZINE HCL 12.5 MG PO TABS Oral Take 12.5 mg by mouth 3 (three) times daily as needed. For inner ear symptoms    . THERA M PLUS PO TABS Oral Take 1 tablet by mouth daily.      BP 106/75  Pulse 88  Temp 98.5 F (36.9 C) (Oral)  Resp 20  SpO2 99%  LMP 08/16/2011  Physical Exam  Nursing note and vitals reviewed. Constitutional: She is oriented to person, place, and time. She appears well-developed and well-nourished.  HENT:  Head: Normocephalic.  Eyes: Conjunctivae are normal.  Neck: Normal range of motion. Neck supple.  Cardiovascular: Normal rate, regular rhythm and normal heart sounds.   Pulmonary/Chest: Effort normal and breath sounds normal. No respiratory distress. She has no wheezes. She has no rales.  Abdominal: Soft. She exhibits no distension. There is no tenderness. There is no rebound.  Musculoskeletal: Normal range of motion. She exhibits no edema.  Lymphadenopathy:    She has no cervical adenopathy.  Neurological: She is alert and oriented to person, place, and time. No cranial nerve deficit. Coordination normal.       Normal coordination, equal grip strength bilaterally, no pronator drift, normal coordination.  Skin: Skin is warm and dry.  Psychiatric: She has a normal mood and affect.    ED Course  Procedures (including critical care time) Results for orders placed during the hospital encounter of 09/10/11   CBC WITH DIFFERENTIAL      Component Value Range   WBC 9.0  4.0 - 10.5 K/uL   RBC 4.81  3.87 - 5.11 MIL/uL   Hemoglobin 13.6  12.0 - 15.0 g/dL   HCT 96.2  95.2 - 84.1 %   MCV 82.7  78.0 - 100.0 fL   MCH 28.3  26.0 - 34.0 pg   MCHC 34.2  30.0 - 36.0 g/dL   RDW 32.4  40.1 - 02.7 %   Platelets 273  150 - 400 K/uL   Neutrophils Relative 66  43 - 77 %   Neutro Abs 5.9  1.7 - 7.7 K/uL   Lymphocytes Relative 25  12 - 46 %   Lymphs Abs 2.3  0.7 - 4.0 K/uL   Monocytes Relative 7  3 - 12 %   Monocytes Absolute 0.6  0.1 - 1.0 K/uL   Eosinophils Relative 2  0 - 5 %   Eosinophils Absolute 0.2  0.0 - 0.7 K/uL   Basophils Relative 0  0 - 1 %   Basophils Absolute 0.0  0.0 - 0.1 K/uL  COMPREHENSIVE METABOLIC PANEL      Component Value Range   Sodium 141  135 - 145 mEq/L   Potassium 3.5  3.5 - 5.1 mEq/L   Chloride 104  96 - 112 mEq/L   CO2 24  19 - 32 mEq/L   Glucose, Bld 86  70 - 99 mg/dL   BUN 16  6 - 23 mg/dL   Creatinine, Ser 2.53  0.50 - 1.10 mg/dL   Calcium 9.4  8.4 - 66.4 mg/dL   Total Protein 7.7  6.0 - 8.3 g/dL   Albumin 3.9  3.5 - 5.2 g/dL   AST 12  0 - 37 U/L   ALT 7  0 - 35 U/L   Alkaline Phosphatase 58  39 - 117 U/L   Total Bilirubin 0.2 (*) 0.3 - 1.2 mg/dL   GFR calc non Af Amer >90  >90 mL/min   GFR calc Af Amer >90  >90 mL/min  URINALYSIS, ROUTINE W REFLEX MICROSCOPIC      Component Value Range   Color, Urine YELLOW  YELLOW   APPearance CLEAR  CLEAR   Specific Gravity, Urine 1.015  1.005 - 1.030   pH 5.5  5.0 - 8.0   Glucose, UA NEGATIVE  NEGATIVE mg/dL   Hgb urine dipstick SMALL (*) NEGATIVE   Bilirubin Urine NEGATIVE  NEGATIVE   Ketones, ur 15 (*) NEGATIVE mg/dL   Protein, ur NEGATIVE  NEGATIVE mg/dL   Urobilinogen, UA 0.2  0.0 - 1.0 mg/dL   Nitrite NEGATIVE  NEGATIVE   Leukocytes, UA NEGATIVE  NEGATIVE  PREGNANCY, URINE      Component Value Range   Preg Test, Ur NEGATIVE  NEGATIVE  URINE MICROSCOPIC-ADD ON      Component  Value Range   Squamous Epithelial /  LPF RARE  RARE   WBC, UA 0-2  <3 WBC/hpf   RBC / HPF 0-2  <3 RBC/hpf   Bacteria, UA RARE  RARE   Ct Head Wo Contrast  08/18/2011  *RADIOLOGY REPORT*  Clinical Data: Left eye visual changes, history of surgery for meningioma  CT HEAD WITHOUT CONTRAST  Technique:  Contiguous axial images were obtained from the base of the skull through the vertex without contrast.  Comparison: MRI brain dated 07/30/2011  Findings: No evidence of parenchymal hemorrhage or extra-axial fluid collection. No mass lesion, mass effect, or midline shift.  No CT evidence of acute infarction.  Cerebral volume is age appropriate.  No ventriculomegaly.  Prior left frontoparietal craniotomy.  The visualized paranasal sinuses are essentially clear. The mastoid air cells are unopacified.  No evidence of calvarial fracture.  IMPRESSION: No evidence of acute intracranial abnormality.  Prior left frontoparietal craniotomy.  Original Report Authenticated By: Charline Bills, M.D.     Pt with multiple work ups, CT, MRI, all unremarkable. Pt did have B12 levels drown few days ago, and level is >2000. Pt admits to taking multiple supplements, taking several different vitamis, iron, and additional B12. I instructed her to stop everything other then her prescribed medications. Pt has no neurodeficits. VS normal. Labs normal. Neuro follow up tomorrow.    1. Weakness   2. Dizziness       MDM          Lottie Mussel, PA 09/11/11 [phone]

## 2011-09-10 NOTE — ED Notes (Signed)
She has been seeing Probation officer and a has a neuro appointment tomorrow

## 2011-09-10 NOTE — ED Notes (Signed)
She has a headache and feels like she is going to pass out for 2 months.  She has been here numerus times in the past.  Today she is also c/o abd pain with nv and diarrhes.  lmpnow

## 2011-09-10 NOTE — ED Notes (Signed)
Patient is not in her room.

## 2011-09-10 NOTE — Discharge Instructions (Signed)
Stop all vitamins and supplements unless prescribed by your physician. Specifically stop B12 supplement. Follow up with neurology and your primary care doctor as scheduled.   Dizziness Dizziness is a common problem. It is a feeling of unsteadiness or lightheadedness. You may feel like you are about to faint. Dizziness can lead to injury if you stumble or fall. A person of any age group can suffer from dizziness, but dizziness is more common in older adults. CAUSES  Dizziness can be caused by many different things, including:  Middle ear problems.   Standing for too long.   Infections.   An allergic reaction.   Aging.   An emotional response to something, such as the sight of blood.   Side effects of medicines.   Fatigue.   Problems with circulation or blood pressure.   Excess use of alcohol, medicines, or illegal drug use.   Breathing too fast (hyperventilation).   An arrhythmia or problems with your heart rhythm.   Low red blood cell count (anemia).   Pregnancy.   Vomiting, diarrhea, fever, or other illnesses that cause dehydration.   Diseases or conditions such as Parkinson's disease, high blood pressure (hypertension), diabetes, and thyroid problems.   Exposure to extreme heat.  DIAGNOSIS  To find the cause of your dizziness, your caregiver may do a physical exam, lab tests, radiologic imaging scans, or an electrocardiography test (ECG).  TREATMENT  Treatment of dizziness depends on the cause of your symptoms and can vary greatly. HOME CARE INSTRUCTIONS   Drink enough fluids to keep your urine clear or pale yellow. This is especially important in very hot weather. In the elderly, it is also important in cold weather.   If your dizziness is caused by medicines, take them exactly as directed. When taking blood pressure medicines, it is especially important to get up slowly.   Rise slowly from chairs and steady yourself until you feel okay.   In the morning, first  sit up on the side of the bed. When this seems okay, stand slowly while holding onto something until you know your balance is fine.   If you need to stand in one place for a long time, be sure to move your legs often. Tighten and relax the muscles in your legs while standing.   If dizziness continues to be a problem, have someone stay with you for a day or two. Do this until you feel you are well enough to stay alone. Have the person call your caregiver if he or she notices changes in you that are concerning.   Do not drive or use heavy machinery if you feel dizzy.  SEEK IMMEDIATE MEDICAL CARE IF:   Your dizziness or lightheadedness gets worse.   You feel nauseous or vomit.   You develop problems with talking, walking, weakness, or using your arms, hands, or legs.   You are not thinking clearly or you have difficulty forming sentences. It may take a friend or family member to determine if your thinking is normal.   You develop chest pain, abdominal pain, shortness of breath, or sweating.   Your vision changes.   You notice any bleeding.   You have side effects from medicine that seems to be getting worse rather than better.  MAKE SURE YOU:   Understand these instructions.   Will watch your condition.   Will get help right away if you are not doing well or get worse.  Document Released: 08/22/2000 Document Revised: 02/15/2011 Document Reviewed:  09/15/2010 Exit[employer] Patient Information 2012 [place], [place].

## 2011-09-10 NOTE — ED Notes (Signed)
The patient left before signing her discharge instructions.  This is not an AMA since the PA had advised the patient that she was about to prepare the discharge instructions for her.

## 2011-09-10 NOTE — ED Notes (Signed)
PA at bedside.

## 2011-09-10 NOTE — ED Notes (Signed)
The patient is not in the room, and the gown is on the bed.  PA advised.

## 2011-09-12 ENCOUNTER — Ambulatory Visit (INDEPENDENT_AMBULATORY_CARE_PROVIDER_SITE_OTHER): Payer: Medicare Other | Admitting: Family Medicine

## 2011-09-12 ENCOUNTER — Encounter: Payer: Self-pay | Admitting: Family Medicine

## 2011-09-12 VITALS — BP 129/93 | HR 86 | Ht 61.0 in | Wt 142.0 lb

## 2011-09-12 DIAGNOSIS — R6889 Other general symptoms and signs: Secondary | ICD-10-CM

## 2011-09-12 DIAGNOSIS — Z8342 Family history of familial hypercholesterolemia: Secondary | ICD-10-CM

## 2011-09-12 DIAGNOSIS — G4700 Insomnia, unspecified: Secondary | ICD-10-CM

## 2011-09-12 DIAGNOSIS — Z8349 Family history of other endocrine, nutritional and metabolic diseases: Secondary | ICD-10-CM

## 2011-09-12 DIAGNOSIS — Z87898 Personal history of other specified conditions: Secondary | ICD-10-CM

## 2011-09-12 DIAGNOSIS — Z8639 Personal history of other endocrine, nutritional and metabolic disease: Secondary | ICD-10-CM

## 2011-09-12 DIAGNOSIS — H539 Unspecified visual disturbance: Secondary | ICD-10-CM

## 2011-09-12 DIAGNOSIS — R5381 Other malaise: Secondary | ICD-10-CM

## 2011-09-12 DIAGNOSIS — G459 Transient cerebral ischemic attack, unspecified: Secondary | ICD-10-CM

## 2011-09-12 DIAGNOSIS — R42 Dizziness and giddiness: Secondary | ICD-10-CM

## 2011-09-12 DIAGNOSIS — Z8673 Personal history of transient ischemic attack (TIA), and cerebral infarction without residual deficits: Secondary | ICD-10-CM

## 2011-09-12 DIAGNOSIS — R531 Weakness: Secondary | ICD-10-CM

## 2011-09-12 DIAGNOSIS — R5383 Other fatigue: Secondary | ICD-10-CM

## 2011-09-12 LAB — LDL CHOLESTEROL, DIRECT: Direct LDL: 155 mg/dL — ABNORMAL HIGH

## 2011-09-12 MED ORDER — LORAZEPAM 0.5 MG PO TABS: 0.5000 mg | ORAL_TABLET | Freq: Every evening | ORAL | Status: AC | PRN

## 2011-09-12 NOTE — Assessment & Plan Note (Addendum)
Dizziness persist. May be exacerbated by anxiety due to persistence of symptoms and no known etiology despite multiple ED visits and a recent clinic visit beginning of June 2013.  The fluid in her ear seems to have resolved, but she continues to have persistent symptoms.  Objective findings so far: elevated vitamin B-12 level, white matter changes on 07/2011 MRI (scar from meningioma resection which is stable), ?upper extremity weakness, hyperreflexia.  D/Dx: anxiety/depression, vitamin B-12 toxicity, TIA, multiple sclerosis  -Checking vitamin B-12, ESR, direct LDL today>>>vitamin B-12 still elevated but now 1500 down from >2000. Patient taking One-a-Day, which does contain vitamin B-12. I advised her to stop. However, after speaking with the pharmacy, I did tell patient that elevated vitamin B-12 is unlikely responsible for her symptoms. ESR negative, direct LDL borderline elevated. Recommend dietary/lifestyle modification. Called patient and informed of results. -Recent TSH WNL -Patient has follow-up with neurology 07/10. MRA ordered. 24-hour Holter monitor being considered.  -Follow-up in 2 weeks -Indications to go to ED (namely stroke symptoms). Reassured patient that stroke is less likely at this time

## 2011-09-12 NOTE — Assessment & Plan Note (Signed)
Will continue BDZ for now, which she has been taking, due to acuteness of other issues.  Will not prescribe long-term. Follow-up 2-4 weeks to discuss this more at length and to see if better solution can be found.

## 2011-09-12 NOTE — ED Provider Notes (Signed)
Medical screening examination/treatment/procedure(s) were performed by non-physician practitioner and as supervising physician I was immediately available for consultation/collaboration.  Alsie Younes R. Nadege Carriger, MD 09/12/11 0023 

## 2011-09-12 NOTE — Progress Notes (Signed)
  Subjective:    Patient ID: Melanie [name], female    DOB: [birth year], 36 y.o.   MRN: [id number]  HPI Acute visit: dizziness, feeling like she is "going to die" or is "having a stroke She has felt like this for about 2 months now.  She reports starting to take a high dose of vitamin B-12 at that time. She thought it would help her fatigue and help her lose weight.  Other symptoms:    -dizziness (more like lightheadedness; denies vertigo),   -sometimes left hand numbness (not right now)   -chest congestion  She says she has had a TIA in the past, and she is concerned she is going to have a stroke. Her TIA presented as upper extremity weakness and dysarthria.   She denies stressors/depression/significant life changes. She lives alone at home. Her sister and parent lives nearby.  She denies being sexually active recently.  She denies drug or alcohol use.  Review of Systems Denies fevers/chills She reports pressure behind eyes but denies vision changes, hearing changes Denies chest pain/palpitations Denies difficulty breathing She reports insomnia Denies nausea/vomiting/constipation Denies vaginal discharge/dysuria Denies swelling She reports generalized constant weakness Denies falling or localized weakness  Past Medical History, Family History, Social History, Allergies, and Medications reviewed. History of meningioma s/p resection    Objective:   Physical Exam GEN: NAD; accompanied by sister; well-nourished PSYCH: appears very anxious, tearful at times, appropriate to questions, engaged, alert and oriented HEENT:   [place]/AT, no papilledema, vision and hearing grossly intact   Normal conjunctiva   TM clear bilaterally   No rhinorrhea, normal turbinates   MMM CV: tachycardic, no m/r/g PULM: NI WOB, CTAB without w/r/r ABD: NABS, soft, ND, mild TTP around umbilicus without guarding EXT: no swelling MSK: 4/5 strength hands and upper extremities; 5/5 lower  extremities NEURO:   CN II-XII intact   Sensation: grossly intact throughout   Strength: see above   ROM: intact   Coordination: normal finger-to-nose, straight line walks   Negative Romberg   Reflexes: 3+ reflexes throughout; negative Babinski; no clonus    Assessment & Plan:

## 2011-09-12 NOTE — Patient Instructions (Addendum)
If your lab results are normal, I will send you a letter with the results. If abnormal, someone at the clinic will get in touch with you.   Make an appointment in 2 weeks to follow-up on your insomnia and symptoms.

## 2011-09-13 LAB — SEDIMENTATION RATE: Sed Rate: 5 mm/hr (ref 0–22)

## 2011-09-13 LAB — VITAMIN B12: Vitamin B-12: 1571 pg/mL — ABNORMAL HIGH (ref 211–911)

## 2011-09-28 ENCOUNTER — Encounter: Payer: Self-pay | Admitting: Family Medicine

## 2011-09-28 ENCOUNTER — Ambulatory Visit (INDEPENDENT_AMBULATORY_CARE_PROVIDER_SITE_OTHER): Payer: Medicare Other | Admitting: Family Medicine

## 2011-09-28 VITALS — BP 128/78 | HR 78 | Temp 99.0°F | Ht 61.0 in | Wt 144.0 lb

## 2011-09-28 DIAGNOSIS — R42 Dizziness and giddiness: Secondary | ICD-10-CM

## 2011-09-28 NOTE — Progress Notes (Signed)
  Subjective:    Patient ID: Melanie [name], female    DOB: [birth year], 36 y.o.   MRN: [id number]  HPI Follow-up: dizziness, feeling like she is going to have a stroke, vision changes Improving. She still has periods when she has blurred vision and like she is going to have a stroke.  Taking aspirin 81 mg helps. Sometimes she takes this several times a day.  She has a neurologist in the past 1-2 weeks who is recommending Holter monitor. Scheduled to return there end of this month.  ROS: sense of fullness behind left ear, denies weakness/numbness, denies headache  Review of Systems Per HPI.  Allergies, medication, past medical history reviewed.    Objective:   Physical Exam GEN: NAD; well-nourished, -appearing PSYCH: appears anxious HEENT: normal conjunctiva, norma TM CV: RRR PULM: NI WOB NEURO: no focal deficits; moves all extremities equally; no problems with gait    Assessment & Plan:

## 2011-09-28 NOTE — Patient Instructions (Addendum)
Follow-up at your convenience for Pap smear and physical.

## 2011-09-28 NOTE — Assessment & Plan Note (Signed)
Patient complaining of dizziness, "feeling like she is going to have a stroke", vision changes. This is improving.  Advised her not to take more than 1 baby aspirin daily due to the risk of bleeding.  Follow-up as needed.  She has an appointment with [facility] for Holter monitoring end of this month.  She is aware of indications to RTC or go to the ED (especially if she has concerning signs of stroke).

## 2011-10-02 ENCOUNTER — Encounter: Payer: Self-pay | Admitting: Family Medicine

## 2011-10-02 DIAGNOSIS — R42 Dizziness and giddiness: Secondary | ICD-10-CM

## 2011-10-02 NOTE — Assessment & Plan Note (Signed)
Documentation only. Seen by [facility] (Dr. Pearlean Brownie) on 07/02.  A/P:    ?underlying anxiety/stress: recommended stress accession activities; started on Celexa 10 mg   ?vertebrobasilar ischemia: less likely; MRA of brain and neck did not show insufficiency   Cardiac arrhythmias: will schedule for Holter monitor   Checking salicylate level, memory lab panels, EEG

## 2011-10-09 ENCOUNTER — Telehealth: Payer: Self-pay | Admitting: *Deleted

## 2011-10-09 ENCOUNTER — Encounter (HOSPITAL_COMMUNITY): Payer: Self-pay | Admitting: Emergency Medicine

## 2011-10-09 ENCOUNTER — Emergency Department (HOSPITAL_COMMUNITY)
Admission: EM | Admit: 2011-10-09 | Discharge: 2011-10-09 | Disposition: A | Discharge status: Home Health | Payer: Medicare Other | Attending: Emergency Medicine | Admitting: Emergency Medicine

## 2011-10-09 DIAGNOSIS — Z760 Encounter for issue of repeat prescription: Secondary | ICD-10-CM | POA: Insufficient documentation

## 2011-10-09 DIAGNOSIS — Z8673 Personal history of transient ischemic attack (TIA), and cerebral infarction without residual deficits: Secondary | ICD-10-CM | POA: Insufficient documentation

## 2011-10-09 DIAGNOSIS — Z86718 Personal history of other venous thrombosis and embolism: Secondary | ICD-10-CM | POA: Insufficient documentation

## 2011-10-09 HISTORY — DX: Anxiety disorder, unspecified: F41.9

## 2011-10-09 NOTE — Telephone Encounter (Signed)
Refill called in.  Attempted to contact pt and schedule appt, but it went straight to VM, asked that she call us back.  Pelase schedule her an appt when she calls back.  Advise NO MORE refills without appt. [place], Maryjo Rochester

## 2011-10-09 NOTE — Telephone Encounter (Signed)
Pharmacy calling to request refill of lorazepam 0.5mg  #30 tabs---take 1 tab QHS prn.  Last filled on 09/12/11.  Request routed to Dr. Madolyn Frieze.  Gaylene Brooks, RN

## 2011-10-09 NOTE — ED Provider Notes (Signed)
History     CSN: [id number]  Arrival date & time 10/09/11  1916   First MD Initiated Contact with Patient 10/09/11 1956      Chief Complaint  Patient presents with  . Anxiety    (Consider location/radiation/quality/duration/timing/severity/associated sxs/prior treatment) HPI Comments: Recently statred on Ativan for anxiety called PCP office for refill which was called in but patient has not received a call back from the office  She is asking for an increased dose as well   The history is provided by the patient.    Past Medical History  Diagnosis Date  . TIA (transient ischemic attack)   . Seizures   . Brain tumor 2011  . Embolism - blood clot in left ovary  . Meningioma 08/14/2011    S/p craniotomy Encephaloma present non changing on MRI in 08/2011  . Anxiety     Past Surgical History  Procedure Date  . Brain tumor removed 2011    Family History  Problem Relation Age of Onset  . Depression Mother   . Alcohol abuse Sister   . Alcohol abuse Brother   . Alzheimer's disease Maternal Grandmother   . Heart failure Father   . Hypertension Father   . Diabetes Maternal Grandmother   . Stroke Maternal Grandmother     History  Substance Use Topics  . Smoking status: Never Smoker   . Smokeless tobacco: Not on file  . Alcohol Use: No    OB History    Grav Para Term Preterm Abortions TAB SAB Ect Mult Living                  Review of Systems  Respiratory: Negative for shortness of breath.   Cardiovascular: Negative for chest pain.  Psychiatric/Behavioral: The patient is nervous/anxious.     Allergies  Review of patient's allergies indicates no known allergies.  Home Medications   Current Outpatient Rx  Name Route Sig Dispense Refill  . ASPIRIN EC 81 MG PO TBEC Oral Take 162 mg by mouth every 4 (four) hours.     Marland Kitchen LORAZEPAM 0.5 MG PO TABS Oral Take 0.5 mg by mouth at bedtime as needed. For sleep    . THERA M PLUS PO TABS Oral Take 1 tablet by mouth daily.        BP 119/78  Pulse 68  Temp 97.9 F (36.6 C) (Oral)  Resp 16  SpO2 96%  LMP 10/02/2011  Physical Exam  Constitutional: She appears well-developed and well-nourished.  Eyes: Pupils are equal, round, and reactive to light.  Neck: Normal range of motion.  Cardiovascular: Normal rate.   Pulmonary/Chest: Effort normal.  Musculoskeletal: Normal range of motion.  Neurological: She is alert.  Skin: Skin is warm and dry.    ED Course  Procedures (including critical care time)  Labs Reviewed - No data to display No results found.   1. Medication refill       MDM  Discusses importance of office FU with PCP, Dose not increased        Arman Filter, NP 10/09/11 2020

## 2011-10-09 NOTE — Telephone Encounter (Signed)
BLUE TEAM: Please call in medication for 30 tablets and advise patient she needs appointment in a month for any more refills. Thank you.

## 2011-10-09 NOTE — ED Notes (Addendum)
Pt to ED via [facility] today.  Was recently dx with anxiety and started on lorazepam st's it was a low dose and she is out of them.  Pt c/o feeling weak.  Pt st's she just needs a Rx for lorazepam and a higher dose

## 2011-10-10 NOTE — ED Provider Notes (Signed)
Medical screening examination/treatment/procedure(s) were performed by non-physician practitioner and as supervising physician I was immediately available for consultation/collaboration.  Orin Eberwein, MD 10/10/11 0230 

## 2011-10-12 ENCOUNTER — Emergency Department (HOSPITAL_COMMUNITY)
Admission: EM | Admit: 2011-10-12 | Discharge: 2011-10-13 | Disposition: A | Discharge status: Home / Self Care | Payer: Medicare Other | Attending: Emergency Medicine | Admitting: Emergency Medicine

## 2011-10-12 ENCOUNTER — Emergency Department (HOSPITAL_COMMUNITY): Payer: Medicare Other

## 2011-10-12 ENCOUNTER — Encounter (HOSPITAL_COMMUNITY): Payer: Self-pay | Admitting: Emergency Medicine

## 2011-10-12 DIAGNOSIS — R5381 Other malaise: Secondary | ICD-10-CM | POA: Insufficient documentation

## 2011-10-12 DIAGNOSIS — Z8673 Personal history of transient ischemic attack (TIA), and cerebral infarction without residual deficits: Secondary | ICD-10-CM | POA: Insufficient documentation

## 2011-10-12 DIAGNOSIS — R5383 Other fatigue: Secondary | ICD-10-CM

## 2011-10-12 DIAGNOSIS — Z86718 Personal history of other venous thrombosis and embolism: Secondary | ICD-10-CM | POA: Insufficient documentation

## 2011-10-12 DIAGNOSIS — F419 Anxiety disorder, unspecified: Secondary | ICD-10-CM

## 2011-10-12 DIAGNOSIS — D320 Benign neoplasm of cerebral meninges: Secondary | ICD-10-CM | POA: Insufficient documentation

## 2011-10-12 DIAGNOSIS — F411 Generalized anxiety disorder: Secondary | ICD-10-CM | POA: Insufficient documentation

## 2011-10-12 LAB — CBC
Hemoglobin: 13.8 g/dL (ref 12.0–15.0)
Platelets: 287 10*3/uL (ref 150–400)
RBC: 4.85 MIL/uL (ref 3.87–5.11)
WBC: 9 10*3/uL (ref 4.0–10.5)

## 2011-10-12 LAB — POCT I-STAT TROPONIN I

## 2011-10-12 LAB — BASIC METABOLIC PANEL
CO2: 25 mEq/L (ref 19–32)
Calcium: 9.4 mg/dL (ref 8.4–10.5)
Chloride: 103 mEq/L (ref 96–112)
Potassium: 3.6 mEq/L (ref 3.5–5.1)
Sodium: 139 mEq/L (ref 135–145)

## 2011-10-12 NOTE — ED Notes (Signed)
Pt states she is here because she is so weak that she cannot perform ADLs. Sharp chest pain to L chest. +Dizziness. Pt states she was taking 8 ASA per day and stopped 3 days ago because she noticed bruising to legs and back of L arm.

## 2011-10-12 NOTE — ED Notes (Signed)
Pt refused Orthostatics.

## 2011-10-12 NOTE — ED Notes (Signed)
Reports feels like going to pass out, and when coughs tastes blood; reports noticed bruises on legs that are unexplained; has a headache; decreased appetite; reports cp, nausea

## 2011-10-12 NOTE — ED Provider Notes (Addendum)
History     CSN: [id number]  Arrival date & time 10/12/11  1702   First MD Initiated Contact with Patient 10/12/11 2216      Chief Complaint  Patient presents with  . multiple complaints     (Consider location/radiation/quality/duration/timing/severity/associated sxs/prior treatment) HPI Pt presents with c/o generalized weakness.  She states symptoms have been ongoing for the past 3 months.  She states that she feels "she may die in the next few weeks" due to her ongoing fatigue.  She states she has seen her doctor and was given prescription for ativan.  She states this has helped her to sleep better at night.  She c/o bruising on her legs and tasting blood when she coughs.  She states she only coughs occasionally and it is nonproductive.  She also states she feels "like a headache is coming on".  She also states she has had poor appetite, but has been forcing herself to eat and drink.  No vomiting, no sob.  Denies syncope or chest pain, no fevers.  There are no other associated systemic symptoms, there are no other alleviating or modifying factors.   Past Medical History  Diagnosis Date  . TIA (transient ischemic attack)   . Seizures   . Brain tumor 2011  . Embolism - blood clot in left ovary  . Meningioma 08/14/2011    S/p craniotomy Encephaloma present non changing on MRI in 08/2011  . Anxiety     Past Surgical History  Procedure Date  . Brain tumor removed 2011    Family History  Problem Relation Age of Onset  . Depression Mother   . Alcohol abuse Sister   . Alcohol abuse Brother   . Alzheimer's disease Maternal Grandmother   . Heart failure Father   . Hypertension Father   . Diabetes Maternal Grandmother   . Stroke Maternal Grandmother     History  Substance Use Topics  . Smoking status: Never Smoker   . Smokeless tobacco: Not on file  . Alcohol Use: No    OB History    Grav Para Term Preterm Abortions TAB SAB Ect Mult Living                  Review of  Systems ROS reviewed and all otherwise negative except for mentioned in HPI  Allergies  Review of patient's allergies indicates no known allergies.  Home Medications   Current Outpatient Rx  Name Route Sig Dispense Refill  . ASPIRIN EC 81 MG PO TBEC Oral Take 162 mg by mouth every 4 (four) hours.     Marland Kitchen LORAZEPAM 0.5 MG PO TABS Oral Take 0.5 mg by mouth at bedtime as needed. For sleep    . THERA M PLUS PO TABS Oral Take 1 tablet by mouth daily.      BP 115/65  Pulse 69  Temp 98.8 F (37.1 C) (Oral)  Resp 10  SpO2 100%  LMP 10/02/2011 Vitals reviewed Physical Exam Physical Examination: General appearance - alert, well appearing, and in no distress Mental status - alert, oriented to person, place, and time Eyes - pupils equal and reactive, no conjunctival injection, no scleral icterus Mouth - mucous membranes moist, pharynx normal without lesions Neck - supple, no significant adenopathy Chest - clear to auscultation, no wheezes, rales or rhonchi, symmetric air entry Heart - normal rate, regular rhythm, normal S1, S2, no murmurs, rubs, clicks or gallops Abdomen - soft, nontender, nondistended, no masses or organomegaly, nabs Neurological - alert,  oriented, normal speech, strength 5/5 in extremities x 4, sensation intact Musculoskeletal - no joint tenderness, deformity or swelling Extremities - peripheral pulses normal, no pedal edema, no clubbing or cyanosis Skin - normal coloration and turgor, no rashes Psych- anxious, normal mood  ED Course  Procedures (including critical care time)   Date: 11/17/2011  Rate: 73  Rhythm: normal sinus rhythm  QRS Axis: normal  Intervals: normal  ST/T Wave abnormalities: normal  Conduction Disutrbances: none  Narrative Interpretation: unremarkable        Labs Reviewed  CBC  BASIC METABOLIC PANEL  POCT PREGNANCY, URINE  POCT I-STAT TROPONIN I  LAB REPORT - SCANNED   Dg Chest 2 View  10/12/2011  *RADIOLOGY REPORT*  Clinical  Data: Chest pain.  Weakness.  Left-sided pain.  Pain for 1 week.  12-month history of coughing.  CHEST - 2 VIEW  Comparison: 03/18/2008 study.  04/10/2009 CT.  Findings: Cardiac silhouette is normal in size and shape.  It is stable.  Mediastinal and hilar contours appear stable.  No pulmonary infiltrates or nodules were evident. No pleural abnormality is evident.  Insert bones normal  IMPRESSION: No chest abnormalities are identified.  Original Report Authenticated By: Crawford Givens, M.D.     1. Fatigue   2. Anxiety       MDM  Pt presents with concern for fatigue- she is very anxious on exam and tearful.  She states symptoms have been ongoing for several weeks, she has been taking ativan prescribed by her PMD which she states does help her to sleep.  She has no lab abnormalities, vitals are reassuring.  Overall PE is benign except for her tearfulness and anxiety.  I have explained all results to her and requested orthostatic vital signs to be done- but the patient had left the ED prior to having orthostatics completed.  She was advised to arrange for follow up with her PMD.          Ethelda Chick, MD 10/13/11 1191  Ethelda Chick, MD 11/17/11 [phone]

## 2011-10-18 ENCOUNTER — Telehealth: Payer: Self-pay | Admitting: Family Medicine

## 2011-10-18 NOTE — Telephone Encounter (Signed)
Thank you Melanie [name] for speaking with the patient.  Please notify the patient that I cannot refer her to GI without evaluating her first. Please have her make an appointment to be seen. I do have an opening Wednesday afternoon if she would like to come in then.

## 2011-10-18 NOTE — Telephone Encounter (Signed)
Patient called insisting on a referral to a GI MD.  She doesn't understand why Dr. Madolyn [name] can't just call the GI MD and give them permission to see her.  I attempted through lots of yelling and interruption, to explain that she has to see her MD first and she is scheduled to be seen next Wednesday.  She said that she probably won't be here by then so I told her if she felt that she needed to, she should go to the ER.  This was only after seeing the previous notes from today where it was suggested by [facility] that she go to the ED.

## 2011-10-18 NOTE — Telephone Encounter (Signed)
Patient calling in tears stating that she thinks she's in liver failure and by the end of the day the whites of her eyes are yellow and she's so weak she can barely get out of bed. Informed patient that if her symptoms are this bad she should go to the ED, she states she has been there multiple times and they cant find anything wrong. She states she has also been here several times for this problem too. Patient asking for a referral today to Dr. Loreta Ave or Dr. Elnoria Howard, informed patient that I cannot refer her without authorization from her PCP, explained to patient I would give message to PCP and if symptoms continued to get worse she should go directly to ED.

## 2011-10-18 NOTE — Telephone Encounter (Signed)
Pt is asking to speak with an RN, says she is having GI problems and needs to see a GI doctor, says it is an emergency and she cant come here, wants [place] to to just call the GI office.

## 2011-10-18 NOTE — Telephone Encounter (Signed)
I spoke with the patient.  This has been a difficult patient to deal with since she has multiple vague symptoms and reads information off the Internet, and gives herself diagnoses (such as liver failure, stroke). When worked-up for these things and found to be negative, she does not believe the work-up but continues to insist that she has these problems.  She would probably benefit from a psychiatric referral. I believe her anxiety is playing a significant role. I am not sure though if she would be amenable to this.   She will follow-up with me next week.   I re-iterated if she "feels like I am going to die", then she should go to the emergency room.

## 2011-10-21 ENCOUNTER — Emergency Department (HOSPITAL_COMMUNITY): Payer: Medicare Other

## 2011-10-21 ENCOUNTER — Encounter (HOSPITAL_COMMUNITY): Payer: Self-pay | Admitting: Nurse Practitioner

## 2011-10-21 ENCOUNTER — Other Ambulatory Visit: Payer: Self-pay

## 2011-10-21 ENCOUNTER — Emergency Department (HOSPITAL_COMMUNITY)
Admission: EM | Admit: 2011-10-21 | Discharge: 2011-10-21 | Disposition: A | Discharge status: Home / Self Care | Payer: Medicare Other | Attending: Emergency Medicine | Admitting: Emergency Medicine

## 2011-10-21 DIAGNOSIS — F419 Anxiety disorder, unspecified: Secondary | ICD-10-CM

## 2011-10-21 DIAGNOSIS — R42 Dizziness and giddiness: Secondary | ICD-10-CM | POA: Insufficient documentation

## 2011-10-21 DIAGNOSIS — F411 Generalized anxiety disorder: Secondary | ICD-10-CM | POA: Insufficient documentation

## 2011-10-21 DIAGNOSIS — R109 Unspecified abdominal pain: Secondary | ICD-10-CM

## 2011-10-21 DIAGNOSIS — Z8673 Personal history of transient ischemic attack (TIA), and cerebral infarction without residual deficits: Secondary | ICD-10-CM | POA: Insufficient documentation

## 2011-10-21 LAB — CBC WITH DIFFERENTIAL/PLATELET
Eosinophils Relative: 1 % (ref 0–5)
HCT: 40.8 % (ref 36.0–46.0)
Hemoglobin: 14 g/dL (ref 12.0–15.0)
Lymphocytes Relative: 20 % (ref 12–46)
Lymphs Abs: 1.4 10*3/uL (ref 0.7–4.0)
MCV: 82.8 fL (ref 78.0–100.0)
Monocytes Absolute: 0.5 10*3/uL (ref 0.1–1.0)
Neutro Abs: 5 10*3/uL (ref 1.7–7.7)
RBC: 4.93 MIL/uL (ref 3.87–5.11)
WBC: 7 10*3/uL (ref 4.0–10.5)

## 2011-10-21 LAB — URINE MICROSCOPIC-ADD ON

## 2011-10-21 LAB — URINALYSIS, ROUTINE W REFLEX MICROSCOPIC
Bilirubin Urine: NEGATIVE
Hgb urine dipstick: NEGATIVE
Nitrite: NEGATIVE
Protein, ur: NEGATIVE mg/dL
Specific Gravity, Urine: 1.011 (ref 1.005–1.030)
Urobilinogen, UA: 0.2 mg/dL (ref 0.0–1.0)

## 2011-10-21 LAB — COMPREHENSIVE METABOLIC PANEL
ALT: 6 U/L (ref 0–35)
CO2: 22 mEq/L (ref 19–32)
Calcium: 9.5 mg/dL (ref 8.4–10.5)
Chloride: 105 mEq/L (ref 96–112)
Creatinine, Ser: 0.58 mg/dL (ref 0.50–1.10)
GFR calc Af Amer: >90 mL/min (ref >90)
GFR calc non Af Amer: >90 mL/min (ref >90)
Glucose, Bld: 93 mg/dL (ref 70–99)
Total Bilirubin: 0.2 mg/dL — ABNORMAL LOW (ref 0.3–1.2)

## 2011-10-21 LAB — PROTIME-INR: Prothrombin Time: 14 seconds (ref 11.6–15.2)

## 2011-10-21 LAB — ACETAMINOPHEN LEVEL: Acetaminophen (Tylenol), Serum: <15 ug/mL (ref 10–30)

## 2011-10-21 MED ORDER — SODIUM CHLORIDE 0.9 % IV BOLUS (SEPSIS)
1000.0000 mL | Freq: Once | INTRAVENOUS | Status: AC
  Administered 2011-10-21: 1000 mL via INTRAVENOUS

## 2011-10-21 MED ORDER — LORAZEPAM 2 MG/ML IJ SOLN
1.0000 mg | Freq: Once | INTRAMUSCULAR | Status: AC
  Administered 2011-10-21: 1 mg via INTRAVENOUS
  Filled 2011-10-21: qty 1

## 2011-10-21 MED ORDER — ONDANSETRON HCL 4 MG/2ML IJ SOLN
4.0000 mg | Freq: Once | INTRAMUSCULAR | Status: AC
  Administered 2011-10-21: 4 mg via INTRAVENOUS
  Filled 2011-10-21: qty 2

## 2011-10-21 NOTE — ED Notes (Signed)
X 3 mos. Abd pain, tia's, stress, anxiety, stop cold [place] taken lorazepam, not taking correct dosage of diet pills. Now, rt. Upper abd. Pain, under breast. Weakness, h/a. Multiple complaints.

## 2011-10-21 NOTE — ED Provider Notes (Signed)
4:42 PM Patient placed in CDU holding.  Sign out received from Dr Alto Denver.  Pt with c/o dizziness, blurry vision, also with RUQ abdominal pain.  Given zofran, ativan, IVF with some improvement.  Plan is for MR brain.  If negative, pt may be discharged home with PCP follow up on Wednesday.   Pt currently in MRI.   6:11 PM Discussed MRI results with patient.  Patient requests referrals for ophthalmology and GI for her 3 months of abdominal pain and her blurry vision.  Pt also encouraged to follow up with her PCP Dr Madolyn Frieze on Wednesday.    Results for orders placed during the hospital encounter of 10/21/11  CBC WITH DIFFERENTIAL      Component Value Range   WBC 7.0  4.0 - 10.5 K/uL   RBC 4.93  3.87 - 5.11 MIL/uL   Hemoglobin 14.0  12.0 - 15.0 g/dL   HCT 29.5  62.1 - 30.8 %   MCV 82.8  78.0 - 100.0 fL   MCH 28.4  26.0 - 34.0 pg   MCHC 34.3  30.0 - 36.0 g/dL   RDW 65.7  84.6 - 96.2 %   Platelets 255  150 - 400 K/uL   Neutrophils Relative 72  43 - 77 %   Neutro Abs 5.0  1.7 - 7.7 K/uL   Lymphocytes Relative 20  12 - 46 %   Lymphs Abs 1.4  0.7 - 4.0 K/uL   Monocytes Relative 7  3 - 12 %   Monocytes Absolute 0.5  0.1 - 1.0 K/uL   Eosinophils Relative 1  0 - 5 %   Eosinophils Absolute 0.1  0.0 - 0.7 K/uL   Basophils Relative 0  0 - 1 %   Basophils Absolute 0.0  0.0 - 0.1 K/uL  COMPREHENSIVE METABOLIC PANEL      Component Value Range   Sodium 137  135 - 145 mEq/L   Potassium 3.9  3.5 - 5.1 mEq/L   Chloride 105  96 - 112 mEq/L   CO2 22  19 - 32 mEq/L   Glucose, Bld 93  70 - 99 mg/dL   BUN 10  6 - 23 mg/dL   Creatinine, Ser 9.52  0.50 - 1.10 mg/dL   Calcium 9.5  8.4 - 84.1 mg/dL   Total Protein 7.3  6.0 - 8.3 g/dL   Albumin 3.8  3.5 - 5.2 g/dL   AST 13  0 - 37 U/L   ALT 6  0 - 35 U/L   Alkaline Phosphatase 52  39 - 117 U/L   Total Bilirubin 0.2 (*) 0.3 - 1.2 mg/dL   GFR calc non Af Amer >90  >90 mL/min   GFR calc Af Amer >90  >90 mL/min  URINALYSIS, ROUTINE W REFLEX MICROSCOPIC   Component Value Range   Color, Urine YELLOW  YELLOW   APPearance CLEAR  CLEAR   Specific Gravity, Urine 1.011  1.005 - 1.030   pH 6.5  5.0 - 8.0   Glucose, UA NEGATIVE  NEGATIVE mg/dL   Hgb urine dipstick NEGATIVE  NEGATIVE   Bilirubin Urine NEGATIVE  NEGATIVE   Ketones, ur 15 (*) NEGATIVE mg/dL   Protein, ur NEGATIVE  NEGATIVE mg/dL   Urobilinogen, UA 0.2  0.0 - 1.0 mg/dL   Nitrite NEGATIVE  NEGATIVE   Leukocytes, UA SMALL (*) NEGATIVE  LIPASE, BLOOD      Component Value Range   Lipase 27  11 - 59 U/L  PROTIME-INR  Component Value Range   Prothrombin Time 14.0  11.6 - 15.2 seconds   INR 1.06  0.00 - 1.49  SALICYLATE LEVEL      Component Value Range   Salicylate Lvl <2.0 (*) 2.8 - 20.0 mg/dL  ACETAMINOPHEN LEVEL      Component Value Range   Acetaminophen (Tylenol), Serum <15.0  10 - 30 ug/mL  URINE MICROSCOPIC-ADD ON      Component Value Range   Squamous Epithelial / LPF RARE  RARE   WBC, UA 0-2  <3 WBC/hpf   Bacteria, UA RARE  RARE   Dg Chest 2 View  10/12/2011  *RADIOLOGY REPORT*  Clinical Data: Chest pain.  Weakness.  Left-sided pain.  Pain for 1 week.  53-month history of coughing.  CHEST - 2 VIEW  Comparison: 03/18/2008 study.  04/10/2009 CT.  Findings: Cardiac silhouette is normal in size and shape.  It is stable.  Mediastinal and hilar contours appear stable.  No pulmonary infiltrates or nodules were evident. No pleural abnormality is evident.  Insert bones normal  IMPRESSION: No chest abnormalities are identified.  Original Report Authenticated By: Crawford Givens, M.D.   Ct Head Wo Contrast  10/21/2011  *RADIOLOGY REPORT*  Clinical Data: Dizziness, anxiety, history of surgery for meningioma  CT HEAD WITHOUT CONTRAST  Technique:  Contiguous axial images were obtained from the base of the skull through the vertex without contrast.  Comparison: 08/17/2011  Findings: No evidence of parenchymal hemorrhage or extra-axial fluid collection. No mass lesion, mass effect, or midline  shift.  No CT evidence of acute infarction.  Prior left frontoparietal craniotomy.  Underlying mild encephalomalacic/postsurgical changes (series 2/image 18).  Cerebral volume is age appropriate.  No ventriculomegaly.  The visualized paranasal sinuses are essentially clear. The mastoid air cells are unopacified.  No fracture or dislocation is seen.  IMPRESSION: No evidence of acute intracranial abnormality.  Prior left frontoparietal craniotomy with underlying encephalomalacic/postsurgical changes.  Original Report Authenticated By: Charline Bills, M.D.   Mr Brain Wo Contrast  10/21/2011  *RADIOLOGY REPORT*  Clinical Data: 3-week history of dizziness and headaches.  Post meningioma resection.  MRI HEAD WITHOUT CONTRAST  Technique:  Multiplanar, multiecho pulse sequences of the brain and surrounding structures were obtained according to standard protocol without intravenous contrast.  Comparison: Several prior exams most recent head CT 10/21/2011 and brain MR 07/30/2011.  Findings: No acute infarct.  No intracranial hemorrhage.  Post left frontal craniotomy for resection of meningioma.  On this unenhanced exam no recurrence detected.  Mild encephalomalacia underlying the craniotomy site without change.  Minimal nonspecific white matter type changes stable.  No hydrocephalus.  Major intracranial vascular structures are patent.  Cerebellar tonsils minimally low-lying within the range of normal limits.  Decreased signal intensity of bone marrow unchanged since 2011. This may be normal for this patient.  No underlying anemia noted on labs as cause of this finding.  Polypoid opacification adjacent to the upper right uninterrupted molar is smaller than 2011 exam.  Orbital structures unremarkable.  Minimal ethmoid sinus air cell mucosal thickening.  IMPRESSION: No acute abnormality.  Please see above.  Original Report Authenticated By: Fuller Canada, M.D.      [place], [place] 10/21/11 1816

## 2011-10-21 NOTE — ED Provider Notes (Signed)
History     CSN: [id number]  Arrival date & time 10/21/11  1014   First MD Initiated Contact with Patient 10/21/11 1015      Chief Complaint  Patient presents with  . Dizziness  . Abdominal Pain    (Consider location/radiation/quality/duration/timing/severity/associated sxs/prior treatment) HPI Patient is a 36 year old female with multiple presentations for evaluation of right upper quadrant abdominal pain as well as blurry vision, and dizziness.  During prior right lesions patient has had laboratory studies performed and last had a CAT scan in June. Patient reports symptoms of been present for 3 months. She reports that she is convinced something is wrong with her. Patient was worried that she could have liver failure as she initially noted some yellowing of her sclera with this. Patient has none of the symptoms today. She also reported that she had been told she had had a TIA and instructed to take 81 mg of aspirin. Patient reports that due to her concerns she had been taking 8 aspirin a day. She denies any suicidal or homicidal ideation initially but is clearly anxious. She then reported suicidal ideation but retracted her statement and we discussed that this meant that she did not feel that she can leave here without harming herself. Patient endorses nausea but denies any vomiting. She complains only of generalized weakness rather than focal weakness. She reports blurry vision as well. Nothing makes the patient's symptoms better or worse. Patient has seen her primary care provider as well for this who has told her to come to the emergency department if she is concerned. Patient reports that she feels her primary care doctor also does not believe her. There are no other associated or modifying factors. Past Medical History  Diagnosis Date  . TIA (transient ischemic attack)   . Seizures   . Brain tumor 2011  . Embolism - blood clot in left ovary  . Meningioma 08/14/2011    S/p craniotomy  Encephaloma present non changing on MRI in 08/2011  . Anxiety     Past Surgical History  Procedure Date  . Brain tumor removed 2011    Family History  Problem Relation Age of Onset  . Depression Mother   . Alcohol abuse Sister   . Alcohol abuse Brother   . Alzheimer's disease Maternal Grandmother   . Heart failure Father   . Hypertension Father   . Diabetes Maternal Grandmother   . Stroke Maternal Grandmother     History  Substance Use Topics  . Smoking status: Never Smoker   . Smokeless tobacco: Not on file  . Alcohol Use: No    OB History    Grav Para Term Preterm Abortions TAB SAB Ect Mult Living                  Review of Systems  HENT: Negative.   Eyes: Positive for visual disturbance.  Respiratory: Negative.   Cardiovascular: Negative.   Gastrointestinal: Positive for nausea and abdominal pain.  Musculoskeletal: Negative.   Skin: Negative.   Neurological: Positive for dizziness, weakness and light-headedness.  Hematological: Negative.   Psychiatric/Behavioral: Negative.   All other systems reviewed and are negative.    Allergies  Review of patient's allergies indicates no known allergies.  Home Medications  No current outpatient prescriptions on file.  BP 109/68  Pulse 72  Temp 99.1 F (37.3 C) (Oral)  Resp 14  SpO2 100%  LMP 10/04/2011  Physical Exam  Nursing note and vitals reviewed. GEN: Well-developed, well-nourished  female in no distress, very anxious HEENT: Atraumatic, normocephalic. Oropharynx clear without erythema EYES: PERRLA BL, no scleral icterus. NECK: Trachea midline, no meningismus CV: regular rate and rhythm. No murmurs, rubs, or gallops PULM: No respiratory distress.  No crackles, wheezes, or rales. GI: soft, mild right upper quadrant tenderness to palpation. No guarding, rebound. + bowel sounds  GU: deferred Neuro: cranial nerves grossly 2-12 intact, no abnormalities of strength or sensation, A and O x 3, no dysmetria or  limb ataxia noted on finger to nose task bilaterally. MSK: Patient moves all 4 extremities symmetrically, no deformity, edema, or injury noted Skin: No rashes petechiae, purpura, or jaundice Psych: Very anxious. Denies hallucinations. Patient appears focused on the fact that something is very wrong with her. Patient initially reported some suicidal ideation but then retracted this statement we discussed that this meant that she would harm herself if she left here. I believe this is passive. She denies homicidal ideation.   ED Course  Procedures (including critical care time)  Indication: dizziness Please note this EKG was reviewed extemporaneously by myself.   Date: 10/21/2011  Rate: 58  Rhythm: sinus bradycardia  QRS Axis: normal  Intervals: normal  ST/T Wave abnormalities: nonspecific T wave changes  Conduction Disutrbances:none  Narrative Interpretation:   Old EKG Reviewed: unchanged      Labs Reviewed  COMPREHENSIVE METABOLIC PANEL - Abnormal; Notable for the following:    Total Bilirubin 0.2 (*)     All other components within normal limits  URINALYSIS, ROUTINE W REFLEX MICROSCOPIC - Abnormal; Notable for the following:    Ketones, ur 15 (*)     Leukocytes, UA SMALL (*)     All other components within normal limits  SALICYLATE LEVEL - Abnormal; Notable for the following:    Salicylate Lvl <2.0 (*)     All other components within normal limits  CBC WITH DIFFERENTIAL  LIPASE, BLOOD  PROTIME-INR  ACETAMINOPHEN LEVEL  URINE MICROSCOPIC-ADD ON   Ct Head Wo Contrast  10/21/2011  *RADIOLOGY REPORT*  Clinical Data: Dizziness, anxiety, history of surgery for meningioma  CT HEAD WITHOUT CONTRAST  Technique:  Contiguous axial images were obtained from the base of the skull through the vertex without contrast.  Comparison: 08/17/2011  Findings: No evidence of parenchymal hemorrhage or extra-axial fluid collection. No mass lesion, mass effect, or midline shift.  No CT evidence of  acute infarction.  Prior left frontoparietal craniotomy.  Underlying mild encephalomalacic/postsurgical changes (series 2/image 18).  Cerebral volume is age appropriate.  No ventriculomegaly.  The visualized paranasal sinuses are essentially clear. The mastoid air cells are unopacified.  No fracture or dislocation is seen.  IMPRESSION: No evidence of acute intracranial abnormality.  Prior left frontoparietal craniotomy with underlying encephalomalacic/postsurgical changes.  Original Report Authenticated By: Charline Bills, M.D.     1. Anxiety   2. Abdominal pain   3. Dizziness       MDM  Patient was evaluated by myself. Based on evaluation patient had workup for right upper quadrant pain. She also endorses taking excessive amounts of aspirin and Tylenol but reports she last took them 2 weeks ago. Laboratory workup was unremarkable. Patient then began to complain more family about dizziness and blurred vision. She admitted that she not seen an eye doctor in several years. However patient had persistent neurologic findings. Given patient's history of meningioma as well as persistent symptoms MRI was ordered. Patient had symptoms for months and I did not feel that neurologic consultation was necessary.  Patient did feel somewhat better after receiving a dose of Ativan, normal saline, and Zofran. Patient was moved to the CDU where she will await performance of her MRI. If this is negative patient is clear that she will be discharged home. Report was given to PA, Trixie Dredge.  Patient was moved to the CDU to await performance of her MRI.        Cyndra Numbers, MD 10/21/11 [phone]

## 2011-10-23 ENCOUNTER — Emergency Department (EMERGENCY_DEPARTMENT_HOSPITAL)
Admission: EM | Admit: 2011-10-23 | Discharge: 2011-10-24 | Disposition: A | Discharge status: Home / Self Care | Payer: Medicare Other | Source: Home / Self Care | Attending: Emergency Medicine | Admitting: Emergency Medicine

## 2011-10-23 ENCOUNTER — Encounter (HOSPITAL_COMMUNITY): Payer: Self-pay | Admitting: Emergency Medicine

## 2011-10-23 DIAGNOSIS — N390 Urinary tract infection, site not specified: Secondary | ICD-10-CM | POA: Insufficient documentation

## 2011-10-23 DIAGNOSIS — Z8673 Personal history of transient ischemic attack (TIA), and cerebral infarction without residual deficits: Secondary | ICD-10-CM | POA: Insufficient documentation

## 2011-10-23 DIAGNOSIS — R45851 Suicidal ideations: Secondary | ICD-10-CM | POA: Insufficient documentation

## 2011-10-23 DIAGNOSIS — F411 Generalized anxiety disorder: Secondary | ICD-10-CM | POA: Insufficient documentation

## 2011-10-23 LAB — RAPID URINE DRUG SCREEN, HOSP PERFORMED
Barbiturates: NOT DETECTED
Benzodiazepines: NOT DETECTED
Cocaine: NOT DETECTED
Opiates: NOT DETECTED

## 2011-10-23 LAB — URINALYSIS, ROUTINE W REFLEX MICROSCOPIC
Bilirubin Urine: NEGATIVE
Nitrite: NEGATIVE
Specific Gravity, Urine: 1.022 (ref 1.005–1.030)
pH: 6 (ref 5.0–8.0)

## 2011-10-23 LAB — ETHANOL: Alcohol, Ethyl (B): <11 mg/dL (ref 0–11)

## 2011-10-23 LAB — CBC WITH DIFFERENTIAL/PLATELET
Hemoglobin: 13.8 g/dL (ref 12.0–15.0)
Lymphocytes Relative: 26 % (ref 12–46)
Lymphs Abs: 2.1 10*3/uL (ref 0.7–4.0)
Monocytes Relative: 7 % (ref 3–12)
Neutro Abs: 5.2 10*3/uL (ref 1.7–7.7)
Neutrophils Relative %: 65 % (ref 43–77)
Platelets: 266 10*3/uL (ref 150–400)
RBC: 4.82 MIL/uL (ref 3.87–5.11)
WBC: 7.9 10*3/uL (ref 4.0–10.5)

## 2011-10-23 LAB — COMPREHENSIVE METABOLIC PANEL
ALT: 7 U/L (ref 0–35)
Albumin: 4.1 g/dL (ref 3.5–5.2)
Alkaline Phosphatase: 51 U/L (ref 39–117)
BUN: 11 mg/dL (ref 6–23)
Chloride: 101 mEq/L (ref 96–112)
GFR calc Af Amer: >90 mL/min (ref >90)
Glucose, Bld: 91 mg/dL (ref 70–99)
Potassium: 3.7 mEq/L (ref 3.5–5.1)
Sodium: 136 mEq/L (ref 135–145)
Total Bilirubin: 0.3 mg/dL (ref 0.3–1.2)

## 2011-10-23 LAB — RETICULOCYTES: Retic Count, Absolute: 59.2 10*3/uL (ref 19.0–186.0)

## 2011-10-23 LAB — URINE MICROSCOPIC-ADD ON

## 2011-10-23 MED ORDER — ZOLPIDEM TARTRATE 5 MG PO TABS: 5.0000 mg | ORAL_TABLET | Freq: Every evening | ORAL | Status: DC | PRN

## 2011-10-23 MED ORDER — ACETAMINOPHEN 325 MG PO TABS: 650.0000 mg | ORAL_TABLET | ORAL | Status: DC | PRN

## 2011-10-23 MED ORDER — ALUM & MAG HYDROXIDE-SIMETH 200-200-20 MG/5ML PO SUSP: 30.0000 mL | ORAL | Status: DC | PRN

## 2011-10-23 MED ORDER — ONDANSETRON HCL 4 MG PO TABS: 4.0000 mg | ORAL_TABLET | Freq: Three times a day (TID) | ORAL | Status: DC | PRN

## 2011-10-23 MED ORDER — LORAZEPAM 1 MG PO TABS
1.0000 mg | ORAL_TABLET | Freq: Three times a day (TID) | ORAL | Status: DC | PRN
  Administered 2011-10-23: 1 mg via ORAL
  Filled 2011-10-23: qty 1

## 2011-10-23 MED ORDER — CEPHALEXIN 500 MG PO CAPS
500.0000 mg | ORAL_CAPSULE | Freq: Once | ORAL | Status: AC
  Administered 2011-10-23: 500 mg via ORAL
  Filled 2011-10-23: qty 1

## 2011-10-23 NOTE — ED Provider Notes (Signed)
Medical screening examination/treatment/procedure(s) were performed by non-physician practitioner and as supervising physician I was immediately available for consultation/collaboration.  Toy Baker, MD 10/23/11 [phone]
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
## 2011-10-23 NOTE — ED Notes (Addendum)
States having blurry vision from abd pain,  States 3 months ago have "ministroke, jaundice, abd pain"  States having anxiety attacks from this, photophobic, "it feels like I am dying"   "I'm to the point that if I can't get no help, I just want to shoot myself-I can't get any relief-- I would rather die some other kind of way, than this"   Had MRI and CT scan 2 days ago, states all blood work came back normal, c/o  lymph nodes still swollen in neck.

## 2011-10-23 NOTE — ED Provider Notes (Signed)
History     CSN: [id number]  Arrival date & time 10/23/11  1712   First MD Initiated Contact with Patient 10/23/11 1908      Chief Complaint  Patient presents with  . Blurred Vision  . Abdominal Pain  . Suicidal    (Consider location/radiation/quality/duration/timing/severity/associated sxs/prior treatment) HPI  36 year old female with multiple presentations for evaluation of right upper quadrant abdominal pain as well as blurry vision, and dizziness.  Last visit was 2 days ago to ER for same.  Patient reports symptoms of been present for 3 months. She reports that she is convinced something is wrong with her. Pt  reported that she had been told she had had a TIA and instructed to take 81 mg of aspirin. Patient reports that due to her concerns she had been taking 8 aspirin a day.  Patient endorses nausea but denies any vomiting. She complains only of generalized weakness rather than focal weakness. She reports blurry vision as well. Nothing makes the patient's symptoms better or worse. Pt does not think she is getting appropriate care from her PCP. She endorse loss of appetites, and generalized weakness.  She believes her liver or gallbladder is the source of her problem.  There are no other associated or modifying factors.  Pt had a brain MRI and a head CT scan 2 days ago and it was normal.  Her blood work for previous visits were unremarkable.  She sts "i just want to shoot myself because i'm not getting the relief from my symptoms".  Pt sts "i just want to shoot myself".  Endorse feeling anxious all the time.  Denies HI, auditory or visual hallucination.  Denies self medication with alcohol or rec drugs.     Past Medical History  Diagnosis Date  . TIA (transient ischemic attack)   . Seizures   . Brain tumor 2011  . Embolism - blood clot in left ovary  . Meningioma 08/14/2011    S/p craniotomy Encephaloma present non changing on MRI in 08/2011  . Anxiety     Past Surgical History    Procedure Date  . Brain tumor removed 2011    Family History  Problem Relation Age of Onset  . Depression Mother   . Alcohol abuse Sister   . Alcohol abuse Brother   . Alzheimer's disease Maternal Grandmother   . Heart failure Father   . Hypertension Father   . Diabetes Maternal Grandmother   . Stroke Maternal Grandmother     History  Substance Use Topics  . Smoking status: Never Smoker   . Smokeless tobacco: Not on file  . Alcohol Use: No    OB History    Grav Para Term Preterm Abortions TAB SAB Ect Mult Living                  Review of Systems  All other systems reviewed and are negative.    Allergies  Review of patient's allergies indicates no known allergies.  Home Medications   Current Outpatient Rx  Name Route Sig Dispense Refill  . LORAZEPAM 0.5 MG PO TABS Oral Take 0.5 mg by mouth every 8 (eight) hours as needed. For anxiety/sleep.    . ADULT MULTIVITAMIN W/MINERALS CH Oral Take 1 tablet by mouth daily.      BP 127/80  Pulse 79  Temp 98.4 F (36.9 C) (Oral)  Resp 17  SpO2 99%  LMP 10/04/2011  Physical Exam  Nursing note and vitals reviewed. Constitutional: She is  oriented to person, place, and time. She appears well-developed and well-nourished. No distress.       Awake, alert, nontoxic appearance  HENT:  Head: Atraumatic.  Eyes: Conjunctivae are normal. Right eye exhibits no discharge. Left eye exhibits no discharge.  Neck: Neck supple.  Cardiovascular: Normal rate and regular rhythm.   Pulmonary/Chest: Effort normal. No respiratory distress. She exhibits no tenderness.  Abdominal: Soft. There is no tenderness. There is no rebound.  Musculoskeletal: Normal range of motion. She exhibits no edema and no tenderness.       ROM appears intact, no obvious focal weakness  Neurological: She is alert and oriented to person, place, and time.       Mental status and motor strength appears intact  Skin: Skin is warm. No rash noted.  Psychiatric:  Her speech is normal. Judgment normal. Her mood appears anxious. She is agitated. Thought content is paranoid. Cognition and memory are normal. She exhibits a depressed mood. She expresses no homicidal and no suicidal ideation.    ED Course  Procedures (including critical care time)   Labs Reviewed  POCT PREGNANCY, URINE  URINALYSIS, ROUTINE W REFLEX MICROSCOPIC  CBC WITH DIFFERENTIAL  COMPREHENSIVE METABOLIC PANEL  ETHANOL   Results for orders placed during the hospital encounter of 10/23/11  URINALYSIS, ROUTINE W REFLEX MICROSCOPIC      Component Value Range   Color, Urine YELLOW  YELLOW   APPearance CLEAR  CLEAR   Specific Gravity, Urine 1.022  1.005 - 1.030   pH 6.0  5.0 - 8.0   Glucose, UA NEGATIVE  NEGATIVE mg/dL   Hgb urine dipstick SMALL (*) NEGATIVE   Bilirubin Urine NEGATIVE  NEGATIVE   Ketones, ur NEGATIVE  NEGATIVE mg/dL   Protein, ur NEGATIVE  NEGATIVE mg/dL   Urobilinogen, UA 0.2  0.0 - 1.0 mg/dL   Nitrite NEGATIVE  NEGATIVE   Leukocytes, UA MODERATE (*) NEGATIVE  CBC WITH DIFFERENTIAL      Component Value Range   WBC 7.9  4.0 - 10.5 K/uL   RBC 4.82  3.87 - 5.11 MIL/uL   Hemoglobin 13.8  12.0 - 15.0 g/dL   HCT 40.9  81.1 - 91.4 %   MCV 82.2  78.0 - 100.0 fL   MCH 28.6  26.0 - 34.0 pg   MCHC 34.8  30.0 - 36.0 g/dL   RDW 78.2  95.6 - 21.3 %   Platelets 266  150 - 400 K/uL   Neutrophils Relative 65  43 - 77 %   Neutro Abs 5.2  1.7 - 7.7 K/uL   Lymphocytes Relative 26  12 - 46 %   Lymphs Abs 2.1  0.7 - 4.0 K/uL   Monocytes Relative 7  3 - 12 %   Monocytes Absolute 0.6  0.1 - 1.0 K/uL   Eosinophils Relative 1  0 - 5 %   Eosinophils Absolute 0.1  0.0 - 0.7 K/uL   Basophils Relative 0  0 - 1 %   Basophils Absolute 0.0  0.0 - 0.1 K/uL  COMPREHENSIVE METABOLIC PANEL      Component Value Range   Sodium 136  135 - 145 mEq/L   Potassium 3.7  3.5 - 5.1 mEq/L   Chloride 101  96 - 112 mEq/L   CO2 26  19 - 32 mEq/L   Glucose, Bld 91  70 - 99 mg/dL   BUN 11  6 -  23 mg/dL   Creatinine, Ser 0.86  0.50 - 1.10 mg/dL  Calcium 10.6 (*) 8.4 - 10.5 mg/dL   Total Protein 7.5  6.0 - 8.3 g/dL   Albumin 4.1  3.5 - 5.2 g/dL   AST 9  0 - 37 U/L   ALT 7  0 - 35 U/L   Alkaline Phosphatase 51  39 - 117 U/L   Total Bilirubin 0.3  0.3 - 1.2 mg/dL   GFR calc non Af Amer >90  >90 mL/min   GFR calc Af Amer >90  >90 mL/min  ETHANOL      Component Value Range   Alcohol, Ethyl (B) <11  0 - 11 mg/dL  POCT PREGNANCY, URINE      Component Value Range   Preg Test, Ur NEGATIVE  NEGATIVE  URINE MICROSCOPIC-ADD ON      Component Value Range   Squamous Epithelial / LPF FEW (*) RARE   WBC, UA 7-10  <3 WBC/hpf   RBC / HPF 0-2  <3 RBC/hpf   Bacteria, UA MANY (*) RARE   Dg Chest 2 View  10/12/2011  *RADIOLOGY REPORT*  Clinical Data: Chest pain.  Weakness.  Left-sided pain.  Pain for 1 week.  63-month history of coughing.  CHEST - 2 VIEW  Comparison: 03/18/2008 study.  04/10/2009 CT.  Findings: Cardiac silhouette is normal in size and shape.  It is stable.  Mediastinal and hilar contours appear stable.  No pulmonary infiltrates or nodules were evident. No pleural abnormality is evident.  Insert bones normal  IMPRESSION: No chest abnormalities are identified.  Original Report Authenticated By: Crawford Givens, M.D.   Ct Head Wo Contrast  10/21/2011  *RADIOLOGY REPORT*  Clinical Data: Dizziness, anxiety, history of surgery for meningioma  CT HEAD WITHOUT CONTRAST  Technique:  Contiguous axial images were obtained from the base of the skull through the vertex without contrast.  Comparison: 08/17/2011  Findings: No evidence of parenchymal hemorrhage or extra-axial fluid collection. No mass lesion, mass effect, or midline shift.  No CT evidence of acute infarction.  Prior left frontoparietal craniotomy.  Underlying mild encephalomalacic/postsurgical changes (series 2/image 18).  Cerebral volume is age appropriate.  No ventriculomegaly.  The visualized paranasal sinuses are essentially clear.  The mastoid air cells are unopacified.  No fracture or dislocation is seen.  IMPRESSION: No evidence of acute intracranial abnormality.  Prior left frontoparietal craniotomy with underlying encephalomalacic/postsurgical changes.  Original Report Authenticated By: Charline Bills, M.D.   Mr Brain Wo Contrast  10/21/2011  *RADIOLOGY REPORT*  Clinical Data: 3-week history of dizziness and headaches.  Post meningioma resection.  MRI HEAD WITHOUT CONTRAST  Technique:  Multiplanar, multiecho pulse sequences of the brain and surrounding structures were obtained according to standard protocol without intravenous contrast.  Comparison: Several prior exams most recent head CT 10/21/2011 and brain MR 07/30/2011.  Findings: No acute infarct.  No intracranial hemorrhage.  Post left frontal craniotomy for resection of meningioma.  On this unenhanced exam no recurrence detected.  Mild encephalomalacia underlying the craniotomy site without change.  Minimal nonspecific white matter type changes stable.  No hydrocephalus.  Major intracranial vascular structures are patent.  Cerebellar tonsils minimally low-lying within the range of normal limits.  Decreased signal intensity of bone marrow unchanged since 2011. This may be normal for this patient.  No underlying anemia noted on labs as cause of this finding.  Polypoid opacification adjacent to the upper right uninterrupted molar is smaller than 2011 exam.  Orbital structures unremarkable.  Minimal ethmoid sinus air cell mucosal thickening.  IMPRESSION: No acute abnormality.  Please see above.  Original Report Authenticated By: Fuller Canada, M.D.    1. SI 2. UTI   MDM  Pt has been to ER for multiple visits for same complaints.  C/o loss of appetite, pain to her RUQ, and nausea.  Sts she hasn't eat much for 3 months.  She has been worked up extensively as recent as 2 days ago. On exam, she has no Murphy sign, does not appears dehydrated.  However, pt sts she is suicidal  because "no one is treating my problem".  She was given referral to GI but sts she cannot get an appointment due to "owing $400" from prior visit.     Due to suicidal ideation with "shooting myself", i will consult ACT team for further care.  I do not think pt has biliary disease based on duration and prior eval for same.    7:53 PM UA shows some evidence concerning for UTI.  Since pt has abd pain, will give Keflex as treatment.  Otherwise, pt is medically cleared.  Will consult ACT team for evaluation for possible psych undertone as pt has been to ER mult times for her complaints and now SI.    7:58 PM I have consulted with ACT, who agrees to evaluate pt further.        Fayrene Helper, PA-C 10/23/11 [id number]

## 2011-10-23 NOTE — ED Notes (Signed)
PA. Karleen Hampshire. S into examine patient.

## 2011-10-24 ENCOUNTER — Ambulatory Visit: Payer: Medicare Other | Admitting: Family Medicine

## 2011-10-24 ENCOUNTER — Encounter (HOSPITAL_COMMUNITY): Payer: Self-pay | Admitting: Adult Health

## 2011-10-24 DIAGNOSIS — F411 Generalized anxiety disorder: Secondary | ICD-10-CM | POA: Insufficient documentation

## 2011-10-24 DIAGNOSIS — Z8673 Personal history of transient ischemic attack (TIA), and cerebral infarction without residual deficits: Secondary | ICD-10-CM | POA: Insufficient documentation

## 2011-10-24 DIAGNOSIS — F329 Major depressive disorder, single episode, unspecified: Secondary | ICD-10-CM

## 2011-10-24 DIAGNOSIS — R1011 Right upper quadrant pain: Secondary | ICD-10-CM | POA: Insufficient documentation

## 2011-10-24 LAB — IRON AND TIBC
Iron: 65 ug/dL (ref 42–135)
Saturation Ratios: 20 % (ref 20–55)
TIBC: 324 ug/dL (ref 250–470)
UIBC: 259 ug/dL (ref 125–400)

## 2011-10-24 LAB — BASIC METABOLIC PANEL
BUN: 11 mg/dL (ref 6–23)
CO2: 25 mEq/L (ref 19–32)
Chloride: 104 mEq/L (ref 96–112)
Creatinine, Ser: 0.64 mg/dL (ref 0.50–1.10)

## 2011-10-24 LAB — URINE MICROSCOPIC-ADD ON

## 2011-10-24 LAB — TSH: TSH: 1.736 u[IU]/mL (ref 0.350–4.500)

## 2011-10-24 LAB — CBC WITH DIFFERENTIAL/PLATELET
Basophils Absolute: 0 10*3/uL (ref 0.0–0.1)
HCT: 41.6 % (ref 36.0–46.0)
Hemoglobin: 14.3 g/dL (ref 12.0–15.0)
Lymphocytes Relative: 28 % (ref 12–46)
Monocytes Absolute: 0.5 10*3/uL (ref 0.1–1.0)
Monocytes Relative: 6 % (ref 3–12)
Neutro Abs: 5.2 10*3/uL (ref 1.7–7.7)
RBC: 5.01 MIL/uL (ref 3.87–5.11)
WBC: 8.2 10*3/uL (ref 4.0–10.5)

## 2011-10-24 LAB — URINALYSIS, ROUTINE W REFLEX MICROSCOPIC
Glucose, UA: NEGATIVE mg/dL
Ketones, ur: NEGATIVE mg/dL
pH: 6 (ref 5.0–8.0)

## 2011-10-24 LAB — POCT PREGNANCY, URINE: Preg Test, Ur: NEGATIVE

## 2011-10-24 LAB — T4, FREE: Free T4: 1.05 ng/dL (ref 0.80–1.80)

## 2011-10-24 MED ORDER — CEPHALEXIN 500 MG PO CAPS: 500.0000 mg | ORAL_CAPSULE | Freq: Three times a day (TID) | ORAL | Status: DC

## 2011-10-24 NOTE — ED Notes (Addendum)
Pt reports, "I think I am hemorrhaging because of all the aspirin I took, I have not taken aspirin for 2 weeks now. I need an MRI or CT scan of abdomen and everytime I eat I feel like I am going to a coma. I am weak and I need someone to listen to me. They keep checking blood work and everything was normal. I was taking 8 aspirin a day 81 mg to prevent a stroke. I had a stroke in May when I was taking a lot of diet pills. Something is keeping me from getting oxygen to my head especially after I eat something. I am about to go into a coma. I feel it. Its been ongoing for 3 months."  PT reports blood in commode after BM moderate amount today. Stool was formed.

## 2011-10-24 NOTE — ED Notes (Signed)
While rechecking pt vitals pt stated she was having "sharpe" upper abd pain just above the navel. RN made aware; pt in triage waiting room.

## 2011-10-24 NOTE — ED Provider Notes (Signed)
Pt has been assessed and has been deemed appropriate for discharge home.  I  Saw her at the bedside and she states that after eating lunch she has had significant discomfort and feels that that she has a gastrointestinal condition that has yet to be diagnosed.  Review of her chart reveals that she has presented or expressed multiple somatic issues since her arrival but no significant abnormalities have been noted through her evaluation here.  Plan discharge home per psych recommendations.  Tobin Chad, MD 10/24/11 [phone]

## 2011-10-24 NOTE — Consult Note (Signed)
Reason for Consult: Depression, and anxiety recent intentional weight loss Referring Physician: Dr. [name] is an 36 y.o. female.  HPI: Patient was seen and chart reviewed. Patient came with the complaints about weakness, tiredness, blurred vision, and feeling sick after eating every meal. Patient has lot of medical workup was done, which was not significant with her symptoms. Patient has made a statement of for depression and passive suicidal thoughts with vague suicidal plan on her initial assessment. Patient stated that her depression has been associated with being feelings physically sick. Reportedly patient had a mini stroke in May 2013 and was observed in ER than referred out with the GI workup. Patient stated that she was taken over the counter pills to lose weight for 3 months. She was taken diet pills, laxatives and the aspirin 81 mg 2 pills 4 times a day because of scared of having another mini stroke. Patient lost 30 pounds in 3 months with, intention, and using over the counter medication. Patient was unable to follow up with the GI referral and was [name] who were given lorazepam for anxiety. Patient requesting a referral for GI doctors, who can accept her insurance. Patient denies current symptoms of suicidal ideation, intentions, or plan. She has no homicidal ideations, intentions, or plan. Patient has no evidence of psychosis. Patient has no history of for substance abuse. Patient is willing to take him. Antidepressant medication for depression, anxiety, and followup with outpatient psychiatric services.  Patient has 18 years old son, who is living with the her father at this current time, but eventually comes back to her. Patient lives with her paternal grandparents of her son. Reportedly she has been sitting with elderly people out of the home. Few days. A week. Patient to was a drop out from the high school patient has one previous episode of  depression about 10 years ago.   Patient appeared as per her stated age, dressed with blue scrubs, lying down in her bed with the elevated head end and watching TV. She stated mood is fine without depression. Patient repeatedly denied having suicidal thoughts and the asking medications for her physical health from the ED physician. She has no evidence of homicidal ideation, intentions or plans. She is no evidence of psychotic symptoms.  Past Medical History  Diagnosis Date  . TIA (transient ischemic attack)   . Seizures   . Brain tumor 2011  . Embolism - blood clot in left ovary  . Meningioma 08/14/2011    S/p craniotomy Encephaloma present non changing on MRI in 08/2011  . Anxiety     Past Surgical History  Procedure Date  . Brain tumor removed 2011    Family History  Problem Relation Age of Onset  . Depression Mother   . Alcohol abuse Sister   . Alcohol abuse Brother   . Alzheimer's disease Maternal Grandmother   . Heart failure Father   . Hypertension Father   . Diabetes Maternal Grandmother   . Stroke Maternal Grandmother     Social History:  reports that she has never smoked. She does not have any smokeless tobacco history on file. She reports that she does not drink alcohol or use illicit drugs.  Allergies: No Known Allergies  Medications: I have reviewed the patient's current medications.  Results for orders placed during the hospital encounter of 10/23/11 (from the past 48 hour(s))  URINALYSIS, ROUTINE W REFLEX MICROSCOPIC     Status: Abnormal  Collection Time   10/23/11  6:17 PM      Component Value Range Comment   Color, Urine YELLOW  YELLOW    APPearance CLEAR  CLEAR    Specific Gravity, Urine 1.022  1.005 - 1.030    pH 6.0  5.0 - 8.0    Glucose, UA NEGATIVE  NEGATIVE mg/dL    Hgb urine dipstick SMALL (*) NEGATIVE    Bilirubin Urine NEGATIVE  NEGATIVE    Ketones, ur NEGATIVE  NEGATIVE mg/dL    Protein, ur NEGATIVE  NEGATIVE mg/dL    Urobilinogen, UA 0.2   0.0 - 1.0 mg/dL    Nitrite NEGATIVE  NEGATIVE    Leukocytes, UA MODERATE (*) NEGATIVE   URINE MICROSCOPIC-ADD ON     Status: Abnormal   Collection Time   10/23/11  6:17 PM      Component Value Range Comment   Squamous Epithelial / LPF FEW (*) RARE    WBC, UA 7-10  <3 WBC/hpf    RBC / HPF 0-2  <3 RBC/hpf    Bacteria, UA MANY (*) RARE   URINE RAPID DRUG SCREEN (HOSP PERFORMED)     Status: Normal   Collection Time   10/23/11  6:17 PM      Component Value Range Comment   Opiates NONE DETECTED  NONE DETECTED    Cocaine NONE DETECTED  NONE DETECTED    Benzodiazepines NONE DETECTED  NONE DETECTED    Amphetamines NONE DETECTED  NONE DETECTED    Tetrahydrocannabinol NONE DETECTED  NONE DETECTED    Barbiturates NONE DETECTED  NONE DETECTED   POCT PREGNANCY, URINE     Status: Normal   Collection Time   10/23/11  6:23 PM      Component Value Range Comment   Preg Test, Ur NEGATIVE  NEGATIVE   CBC WITH DIFFERENTIAL     Status: Normal   Collection Time   10/23/11  6:39 PM      Component Value Range Comment   WBC 7.9  4.0 - 10.5 K/uL    RBC 4.82  3.87 - 5.11 MIL/uL    Hemoglobin 13.8  12.0 - 15.0 g/dL    HCT 86.5  78.4 - 69.6 %    MCV 82.2  78.0 - 100.0 fL    MCH 28.6  26.0 - 34.0 pg    MCHC 34.8  30.0 - 36.0 g/dL    RDW 29.5  28.4 - 13.2 %    Platelets 266  150 - 400 K/uL    Neutrophils Relative 65  43 - 77 %    Neutro Abs 5.2  1.7 - 7.7 K/uL    Lymphocytes Relative 26  12 - 46 %    Lymphs Abs 2.1  0.7 - 4.0 K/uL    Monocytes Relative 7  3 - 12 %    Monocytes Absolute 0.6  0.1 - 1.0 K/uL    Eosinophils Relative 1  0 - 5 %    Eosinophils Absolute 0.1  0.0 - 0.7 K/uL    Basophils Relative 0  0 - 1 %    Basophils Absolute 0.0  0.0 - 0.1 K/uL   COMPREHENSIVE METABOLIC PANEL     Status: Abnormal   Collection Time   10/23/11  6:39 PM      Component Value Range Comment   Sodium 136  135 - 145 mEq/L    Potassium 3.7  3.5 - 5.1 mEq/L    Chloride 101  96 - 112 mEq/L  CO2 26  19 - 32 mEq/L     Glucose, Bld 91  70 - 99 mg/dL    BUN 11  6 - 23 mg/dL    Creatinine, Ser 1.47  0.50 - 1.10 mg/dL    Calcium 82.9 (*) 8.4 - 10.5 mg/dL    Total Protein 7.5  6.0 - 8.3 g/dL    Albumin 4.1  3.5 - 5.2 g/dL    AST 9  0 - 37 U/L    ALT 7  0 - 35 U/L    Alkaline Phosphatase 51  39 - 117 U/L    Total Bilirubin 0.3  0.3 - 1.2 mg/dL    GFR calc non Af Amer >90  >90 mL/min    GFR calc Af Amer >90  >90 mL/min   ETHANOL     Status: Normal   Collection Time   10/23/11  6:39 PM      Component Value Range Comment   Alcohol, Ethyl (B) <11  0 - 11 mg/dL   TSH     Status: Normal   Collection Time   10/23/11 10:09 PM      Component Value Range Comment   TSH 1.736  0.350 - 4.500 uIU/mL   T4, FREE     Status: Normal   Collection Time   10/23/11 10:09 PM      Component Value Range Comment   Free T4 1.05  0.80 - 1.80 ng/dL   VITAMIN F62     Status: Abnormal   Collection Time   10/23/11 10:09 PM      Component Value Range Comment   Vitamin B-12 1204 (*) 211 - 911 pg/mL   FOLATE     Status: Normal   Collection Time   10/23/11 10:09 PM      Component Value Range Comment   Folate >20.0     IRON AND TIBC     Status: Normal   Collection Time   10/23/11 10:09 PM      Component Value Range Comment   Iron 65  42 - 135 ug/dL    TIBC 130  865 - 784 ug/dL    Saturation Ratios 20  20 - 55 %    UIBC 259  125 - 400 ug/dL   FERRITIN     Status: Normal   Collection Time   10/23/11 10:09 PM      Component Value Range Comment   Ferritin 16  10 - 291 ng/mL   RETICULOCYTES     Status: Normal   Collection Time   10/23/11 10:09 PM      Component Value Range Comment   Retic Ct Pct 1.3  0.4 - 3.1 %    RBC. 4.55  3.87 - 5.11 MIL/uL    Retic Count, Manual 59.2  19.0 - 186.0 K/uL     No results found.  No psychosis and Positive for anxiety, bad mood, sleep disturbance and recent intentional loss of weight.  Blood pressure 163/75, pulse 74, temperature 98.8 F (37.1 C), temperature source Oral, resp. rate 18,  last menstrual period 10/04/2011, SpO2 100.00%.   Assessment/Plan: Depressive disorder, not otherwise specified, Anxiety disorder, not otherwise specified   Patient does not meet criteria for acute psychiatric hospitalization and the able to contract for safety. During this evaluation. Patient the was referred for the outpatient psychiatric services for medication management and the counseling services. Discussed risk and benefits of the medication and given verbal consent for antidepressant medication fluoxetine. Patient may  get a prescription for fluoxetine 20 mg daily morning.   Srikar Chiang,JANARDHAHA R. 10/24/2011, 4:21 PM
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
## 2011-10-24 NOTE — ED Notes (Signed)
MD at bedside. 

## 2011-10-24 NOTE — ED Provider Notes (Addendum)
  Physical Exam  BP 110/79  Pulse 72  Temp 98.2 F (36.8 C) (Oral)  Resp 18  SpO2 98%  LMP 10/04/2011  Physical Exam  ED Course  Procedures  MDM Patient sleeping on my evaluation. Per notes patient has been discussed with the PA at behavioral health, but they wanted to look at the information again in the morning. Patient's had multiple somatic complaints. She states that Dr. came in the room told her to not look at him that way and left     Melanie [name]. Melanie Payor, MD 10/24/11 [phone]  Patient states that she is no longer suicidal. She states she just needs a seeing a GI specialist. She has made suicidal statements while she was here. She stated that she would not be here she had done. She states she just needs her medical issues to get better. Patient has a bed at [facility], but awaiting evaluation by the MD there. May need telepsych since it has already been 8 hours since contact with [facility].  Melanie [name]. Melanie Payor, MD 10/24/11 1017

## 2011-10-24 NOTE — BHH Counselor (Signed)
Nevada Crane, Child psychotherapist at [employer], submitted Pt for admission to [facility]. Consulted with Lutricia Feil, AC who confirmed bed availability. Clinical report was given to Verne Spurr, PA who said she wanted to review Pt's information herself in the morning and make a decision at that time. Communicated this information to Beatrix Shipper, assessment counselor at [facility].  Harlin Rain Patsy [place], LPC

## 2011-10-25 ENCOUNTER — Emergency Department (HOSPITAL_COMMUNITY): Payer: Medicare Other

## 2011-10-25 ENCOUNTER — Other Ambulatory Visit: Payer: Self-pay

## 2011-10-25 ENCOUNTER — Emergency Department (HOSPITAL_COMMUNITY)
Admission: EM | Admit: 2011-10-25 | Discharge: 2011-10-25 | Disposition: A | Discharge status: Home / Self Care | Payer: Medicare Other | Attending: Emergency Medicine | Admitting: Emergency Medicine

## 2011-10-25 DIAGNOSIS — R1011 Right upper quadrant pain: Secondary | ICD-10-CM

## 2011-10-25 LAB — URINE CULTURE

## 2011-10-25 MED ORDER — OMEPRAZOLE 20 MG PO CPDR: 20.0000 mg | DELAYED_RELEASE_CAPSULE | Freq: Every day | ORAL | Status: DC

## 2011-10-25 NOTE — ED Provider Notes (Signed)
History     CSN: [id number]  Arrival date & time 10/24/11  1924   First MD Initiated Contact with Patient 10/25/11 0113      Chief Complaint  Patient presents with  . Abdominal Pain    (Consider location/radiation/quality/duration/timing/severity/associated sxs/prior treatment) HPI Comments: Patient presents with multiple meandering complaints. See nursing notes. Patient states that she's been getting epigastric and right upper quadrant pain 3 or 4 hours after eating for the past 3 months. Previous ED visits for the same. Is nothing is being done for her and she does know is going on. She is worried that she is bleeding because she's taking aspirin tablets daily.  She states that whenever she eats something she feels like she is going to go into a coma. This is been happening on and off for the past 3 months. She denies any vomiting, chest pain or shortness of breath. Today she said she noticed some blood in her stool.  She currently denies any suicidal or homicidal ideation.  The history is provided by the patient.    Past Medical History  Diagnosis Date  . TIA (transient ischemic attack)   . Seizures   . Brain tumor 2011  . Embolism - blood clot in left ovary  . Meningioma 08/14/2011    S/p craniotomy Encephaloma present non changing on MRI in 08/2011  . Anxiety     Past Surgical History  Procedure Date  . Brain tumor removed 2011    Family History  Problem Relation Age of Onset  . Depression Mother   . Alcohol abuse Sister   . Alcohol abuse Brother   . Alzheimer's disease Maternal Grandmother   . Heart failure Father   . Hypertension Father   . Diabetes Maternal Grandmother   . Stroke Maternal Grandmother     History  Substance Use Topics  . Smoking status: Never Smoker   . Smokeless tobacco: Not on file  . Alcohol Use: No    OB History    Grav Para Term Preterm Abortions TAB SAB Ect Mult Living                  Review of Systems  Constitutional:  Positive for activity change, appetite change and fatigue. Negative for fever.  HENT: Negative for congestion and rhinorrhea.   Respiratory: Negative for cough, chest tightness and shortness of breath.   Cardiovascular: Negative for chest pain.  Gastrointestinal: Positive for nausea, abdominal pain, constipation and blood in stool. Negative for vomiting.  Genitourinary: Negative for dysuria, vaginal bleeding and vaginal discharge.  Musculoskeletal: Negative for back pain.  Skin: Negative for rash.  Neurological: Positive for weakness. Negative for headaches.    Allergies  Review of patient's allergies indicates no known allergies.  Home Medications   Current Outpatient Rx  Name Route Sig Dispense Refill  . LORAZEPAM 0.5 MG PO TABS Oral Take 0.5 mg by mouth at bedtime as needed. For anxiety/sleep.    . ADULT MULTIVITAMIN W/MINERALS CH Oral Take 1 tablet by mouth daily.    Marland Kitchen OMEPRAZOLE 20 MG PO CPDR Oral Take 1 capsule (20 mg total) by mouth daily. 30 capsule 0    BP 99/63  Pulse 60  Temp 98.4 F (36.9 C) (Oral)  Resp 18  SpO2 100%  LMP 10/04/2011  Physical Exam  Constitutional: She is oriented to person, place, and time. She appears well-developed and well-nourished. No distress.  HENT:  Head: Normocephalic and atraumatic.  Mouth/Throat: Oropharynx is clear and moist.  Eyes: Conjunctivae and EOM are normal.  Neck: Normal range of motion. Neck supple.  Cardiovascular: Normal rate, regular rhythm and normal heart sounds.   No murmur heard. Pulmonary/Chest: Effort normal and breath sounds normal. No respiratory distress.  Abdominal: Soft. There is tenderness. There is guarding. There is no rebound.  Genitourinary:       No hemorrhoids or fissures, Hemoccult-negative  Musculoskeletal: Normal range of motion. She exhibits no edema and no tenderness.  Neurological: She is alert and oriented to person, place, and time. No cranial nerve deficit.  Skin: Skin is warm.    ED  Course  Procedures (including critical care time)  Labs Reviewed  URINALYSIS, ROUTINE W REFLEX MICROSCOPIC - Abnormal; Notable for the following:    APPearance HAZY (*)     Hgb urine dipstick LARGE (*)  REPEATED TO VERIFY   Leukocytes, UA SMALL (*)     All other components within normal limits  URINE MICROSCOPIC-ADD ON - Abnormal; Notable for the following:    Squamous Epithelial / LPF FEW (*)     Bacteria, UA FEW (*)     Crystals CA OXALATE CRYSTALS (*)     All other components within normal limits  CBC WITH DIFFERENTIAL  BASIC METABOLIC PANEL  POCT PREGNANCY, URINE  OCCULT BLOOD, POC DEVICE  LAB REPORT - SCANNED   US Abdomen Complete  10/25/2011  *RADIOLOGY REPORT*  Clinical Data:  Right upper quadrant pain.  Nausea.  COMPLETE ABDOMINAL ULTRASOUND  Comparison:  03/09/2008.  Findings:  Gallbladder:  No gallstones, gallbladder wall thickening, or pericholecystic fluid.  Common bile duct:  4 mm, normal.  Liver:  No focal lesion identified.  Within normal limits in parenchymal echogenicity.  IVC:  Appears normal.  Pancreas:  No focal abnormality seen.  Spleen:  6.4 cm with normal echotexture.  Right Kidney:  9.4 cm. Normal echotexture.  Normal central sinus echo complex.  No calculi or hydronephrosis.  Left Kidney:  10.1 cm. Normal echotexture.  Normal central sinus echo complex.  No calculi or hydronephrosis.  Abdominal aorta:  No aneurysm identified.  IMPRESSION: Normal abdominal ultrasound.  Negative for cholelithiasis or cholecystitis.  Original Report Authenticated By: Andreas Newport, M.D.     1. RUQ pain       MDM  Intermittent upper abdominal pain for the past 3 months that is worse with eating. Feels like she is going to "go into a coma" every time she eats.  LFTs lipase unremarkable. Normal anion gap.  Ultrasound obtained to evaluate gallbladder given upper abdominal pain.  Vitals are reassuring, labs are unremarkable. Patient stable for followup with her PCP and GI as  necessary.    Date: 10/25/2011  Rate: 61  Rhythm: normal sinus rhythm  QRS Axis: normal  Intervals: normal  ST/T Wave abnormalities: normal  Conduction Disutrbances:none  Narrative Interpretation:   Old EKG Reviewed: none available    Glynn Octave, MD 10/25/11 [phone]

## 2011-10-25 NOTE — ED Notes (Signed)
Prescription given with discharge instructions.  

## 2011-10-25 NOTE — ED Notes (Signed)
Pt. States "everytime I eat 3-4 hours later, I get really weak and dizzy and feel like I'm going into a coma". Pt. Reports abdominal pain and and nausea. Also reports seeing a small amount of blood in stool.

## 2011-10-25 NOTE — ED Provider Notes (Signed)
Medical screening examination/treatment/procedure(s) were conducted as a shared visit with non-physician practitioner(s) and myself.  I personally evaluated the patient during the encounter   Cyndra Numbers, MD 10/25/11 [phone]

## 2011-10-26 LAB — VITAMIN D 1,25 DIHYDROXY: Vitamin D2 1, 25 (OH)2: <8 pg/mL

## 2011-10-26 NOTE — ED Notes (Signed)
+   urine Chart sent to EDP office for review. 

## 2011-10-27 NOTE — ED Notes (Addendum)
Likely contaminant . No abx needed per Grant Fontana PAC.

## 2011-10-29 ENCOUNTER — Encounter (HOSPITAL_COMMUNITY): Payer: Self-pay | Admitting: Emergency Medicine

## 2011-10-29 DIAGNOSIS — G8929 Other chronic pain: Secondary | ICD-10-CM | POA: Insufficient documentation

## 2011-10-29 DIAGNOSIS — Z8673 Personal history of transient ischemic attack (TIA), and cerebral infarction without residual deficits: Secondary | ICD-10-CM | POA: Insufficient documentation

## 2011-10-29 DIAGNOSIS — F411 Generalized anxiety disorder: Secondary | ICD-10-CM | POA: Insufficient documentation

## 2011-10-29 DIAGNOSIS — M549 Dorsalgia, unspecified: Secondary | ICD-10-CM | POA: Insufficient documentation

## 2011-10-29 LAB — CBC WITH DIFFERENTIAL/PLATELET
Eosinophils Absolute: 0.2 10*3/uL (ref 0.0–0.7)
Eosinophils Relative: 2 % (ref 0–5)
HCT: 41.3 % (ref 36.0–46.0)
Hemoglobin: 14.1 g/dL (ref 12.0–15.0)
Lymphocytes Relative: 25 % (ref 12–46)
Lymphs Abs: 1.9 10*3/uL (ref 0.7–4.0)
MCH: 28.5 pg (ref 26.0–34.0)
MCV: 83.4 fL (ref 78.0–100.0)
Monocytes Relative: 6 % (ref 3–12)
RBC: 4.95 MIL/uL (ref 3.87–5.11)

## 2011-10-29 LAB — URINALYSIS, ROUTINE W REFLEX MICROSCOPIC
Bilirubin Urine: NEGATIVE
Glucose, UA: NEGATIVE mg/dL
Hgb urine dipstick: NEGATIVE
Specific Gravity, Urine: 1.023 (ref 1.005–1.030)
pH: 6 (ref 5.0–8.0)

## 2011-10-29 NOTE — ED Notes (Signed)
PT. REPORTS PERSISTENT PAIN AT LEFT LOWER BACK , RUQ PAIN FOR SEVERAL MONTHS , SEEN HERE AND WAS DIAGNOSED WITH UTI - CURRENTLY TAKING ANTIBIOTIC WITH NO IMPROVEMENT.

## 2011-10-30 ENCOUNTER — Emergency Department (HOSPITAL_COMMUNITY)
Admission: EM | Admit: 2011-10-30 | Discharge: 2011-10-30 | Disposition: A | Discharge status: Home / Self Care | Payer: Medicare Other | Attending: Emergency Medicine | Admitting: Emergency Medicine

## 2011-10-30 ENCOUNTER — Ambulatory Visit: Payer: Medicare Other | Admitting: Family Medicine

## 2011-10-30 DIAGNOSIS — F419 Anxiety disorder, unspecified: Secondary | ICD-10-CM

## 2011-10-30 DIAGNOSIS — G8929 Other chronic pain: Secondary | ICD-10-CM

## 2011-10-30 LAB — BASIC METABOLIC PANEL
BUN: 12 mg/dL (ref 6–23)
CO2: 26 mEq/L (ref 19–32)
Calcium: 9.3 mg/dL (ref 8.4–10.5)
GFR calc non Af Amer: >90 mL/min (ref >90)
Glucose, Bld: 106 mg/dL — ABNORMAL HIGH (ref 70–99)

## 2011-10-30 MED ORDER — POTASSIUM CHLORIDE CRYS ER 20 MEQ PO TBCR
40.0000 meq | EXTENDED_RELEASE_TABLET | Freq: Once | ORAL | Status: AC
  Administered 2011-10-30: 40 meq via ORAL
  Filled 2011-10-30: qty 2

## 2011-10-30 NOTE — ED Provider Notes (Signed)
Medical screening examination/treatment/procedure(s) were conducted as a shared visit with non-physician practitioner(s) and myself.  I personally evaluated the patient during the encounter. PT evaluated no acute ABD, d/w FP resident and he evaluated PT bedside and arranged for f/u 1 day PCP  Sunnie Nielsen, MD 10/30/11 [phone]

## 2011-10-30 NOTE — ED Provider Notes (Signed)
History     CSN: [id number]  Arrival date & time 10/29/11  2306   First MD Initiated Contact with Patient 10/30/11 0122      Chief Complaint  Patient presents with  . Back Pain    (Consider location/radiation/quality/duration/timing/severity/associated sxs/prior treatment) HPI Comments: Shouldn't complaining of a "knot" over her right last rib.  It is tender to the touch.  She also complains that if she eats a big meal or a fatty meal.  Several hours later, she becomes nauseated, and weak, and Effexor eyes, and she, know she's dying from this and nobody will take her seriously.  She has been seen by neurology who did not feel that her muscle weakness, with a neurologic problem.  They referred her to cardiology, but she didn't think it was cardiology problems.  If she did not keep that appointment she has seen her primary care physician on numerous occasions and is unhappy that they're not finding anything wrong with her.  She was seen in this emergency room several days ago and diagnosed with a urinary tract infection.  She states she is taking antibiotics, but still having, back, and muscle pain.  Denies vaginal discharge at this time  Patient is a 36 y.o. female presenting with back pain. The history is provided by the patient.  Back Pain  This is a chronic problem. The current episode started more than 1 week ago. The problem has been gradually worsening. The pain is associated with no known injury. Associated symptoms include abdominal pain and weakness. Pertinent negatives include no fever and no dysuria.    Past Medical History  Diagnosis Date  . TIA (transient ischemic attack)   . Seizures   . Brain tumor 2011  . Embolism - blood clot in left ovary  . Meningioma 08/14/2011    S/p craniotomy Encephaloma present non changing on MRI in 08/2011  . Anxiety     Past Surgical History  Procedure Date  . Brain tumor removed 2011    Family History  Problem Relation Age of Onset  .  Depression Mother   . Alcohol abuse Sister   . Alcohol abuse Brother   . Alzheimer's disease Maternal Grandmother   . Heart failure Father   . Hypertension Father   . Diabetes Maternal Grandmother   . Stroke Maternal Grandmother     History  Substance Use Topics  . Smoking status: Never Smoker   . Smokeless tobacco: Not on file  . Alcohol Use: No    OB History    Grav Para Term Preterm Abortions TAB SAB Ect Mult Living                  Review of Systems  Constitutional: Negative for fever and chills.  HENT: Negative for rhinorrhea.   Respiratory: Negative for shortness of breath.   Gastrointestinal: Positive for nausea and abdominal pain. Negative for vomiting, diarrhea, constipation and blood in stool.  Genitourinary: Negative for dysuria, hematuria and flank pain.  Musculoskeletal: Positive for back pain.  Neurological: Positive for weakness. Negative for dizziness and tremors.  Psychiatric/Behavioral: Positive for agitation. The patient is nervous/anxious.     Allergies  Review of patient's allergies indicates no known allergies.  Home Medications   Current Outpatient Rx  Name Route Sig Dispense Refill  . CEPHALEXIN 500 MG PO CAPS Oral Take 500 mg by mouth 3 (three) times daily. For 7 days; Start date 10/24/11    . LORAZEPAM 0.5 MG PO TABS Oral Take  0.5 mg by mouth at bedtime as needed. For anxiety/sleep.    . ADULT MULTIVITAMIN W/MINERALS CH Oral Take 1 tablet by mouth daily.    Marland Kitchen OMEPRAZOLE 20 MG PO CPDR Oral Take 1 capsule (20 mg total) by mouth daily. 30 capsule 0    BP 120/82  Temp 98.2 F (36.8 C) (Oral)  Resp 18  SpO2 100%  LMP 10/04/2011  Physical Exam  Constitutional: She is oriented to person, place, and time. She appears well-developed and well-nourished.  HENT:  Head: Normocephalic.  Eyes: Pupils are equal, round, and reactive to light.  Neck: Normal range of motion.  Cardiovascular: Normal rate.   Pulmonary/Chest: Effort normal.  Abdominal:  Soft. Bowel sounds are normal. She exhibits no distension. There is tenderness.    Musculoskeletal: Normal range of motion.  Neurological: She is alert and oriented to person, place, and time.  Skin: Skin is warm. No rash noted.  Psychiatric: Her mood appears anxious.    ED Course  Procedures (including critical care time)  Labs Reviewed  BASIC METABOLIC PANEL - Abnormal; Notable for the following:    Potassium 3.1 (*)     Glucose, Bld 106 (*)     All other components within normal limits  URINALYSIS, ROUTINE W REFLEX MICROSCOPIC  CBC WITH DIFFERENTIAL  POCT PREGNANCY, URINE   No results found.   1. Chronic back pain   2. Anxiety       MDM  Patient's exam is benign other than a slightly low, potassium of 3.1, her lab work, is normal.  Her urine does not indicate any signs of infection or hematuria One of her physicians from family practice came and spoke with the, patient.  They've arranged for her to have office evaluation tomorrow at 1:30 patient will be discharged home with instructions to followup in the office tomorrow        Arman Filter, NP 10/30/11 [phone]

## 2011-11-01 ENCOUNTER — Inpatient Hospital Stay: Payer: Medicare Other | Admitting: Family Medicine

## 2011-11-05 ENCOUNTER — Encounter: Payer: Self-pay | Admitting: Family Medicine

## 2011-11-05 ENCOUNTER — Ambulatory Visit (INDEPENDENT_AMBULATORY_CARE_PROVIDER_SITE_OTHER): Payer: Medicare Other | Admitting: Family Medicine

## 2011-11-05 ENCOUNTER — Telehealth: Payer: Self-pay | Admitting: Family Medicine

## 2011-11-05 VITALS — BP 148/74 | HR 82 | Temp 97.8°F | Ht 61.0 in | Wt 140.0 lb

## 2011-11-05 DIAGNOSIS — R5383 Other fatigue: Secondary | ICD-10-CM

## 2011-11-05 DIAGNOSIS — F329 Major depressive disorder, single episode, unspecified: Secondary | ICD-10-CM

## 2011-11-05 MED ORDER — LORAZEPAM 0.5 MG PO TABS: 0.5000 mg | ORAL_TABLET | Freq: Every evening | ORAL | Status: DC | PRN

## 2011-11-05 MED ORDER — CITALOPRAM HYDROBROMIDE 20 MG PO TABS: 20.0000 mg | ORAL_TABLET | Freq: Every day | ORAL | Status: DC

## 2011-11-05 NOTE — Telephone Encounter (Signed)
Called and informed patient of Rx and previous message

## 2011-11-05 NOTE — Assessment & Plan Note (Addendum)
Will set up apt with Dr. Pascal Lux for further evaluation.  PHQ 9 at today's visit in 20.  Will start on Celexa 20 mg and see back in 2 weeks.  IN the meantime, would like pt to f/u with Dr. Pascal Lux for possible underlying Munchausen's syndrome vs possible CBT for MDD.  Went over SE profile of Celexa as well and will most likely need to titrate up to 40 mg at next visit, if tolerating well.

## 2011-11-05 NOTE — Telephone Encounter (Signed)
Patient is calling back saying that this is an emergency situation.

## 2011-11-05 NOTE — Telephone Encounter (Signed)
Rx called in  Back to MD. Milas Gain, Maryjo Rochester

## 2011-11-05 NOTE — Telephone Encounter (Signed)
Patient is afraid to fill the Celexa due to the side effects that go with it so she doesn't want to switch of of the Lorazepam.

## 2011-11-05 NOTE — Telephone Encounter (Signed)
Will forward to Dr Madolyn Frieze for advice.

## 2011-11-05 NOTE — Patient Instructions (Signed)
Citalopram oral solution What is this medicine? CITALOPRAM (sye TAL oh pram) is used to treat depression. This medicine may be used for other purposes; ask your health care provider or pharmacist if you have questions. What should I tell my health care provider before I take this medicine? They need to know if you have any of these conditions: -bipolar disorder or a family history of bipolar disorder -diabetes -heart disease -history of irregular heartbeat -kidney or liver disease -low levels of magnesium or potassium in the blood -receiving electroconvulsive therapy -seizures (convulsions) -suicidal thoughts or a previous suicide attempt -an unusual or allergic reaction to citalopram, escitalopram, other medicines, foods, dyes, or preservatives -pregnant or trying to become pregnant -breast-feeding How should I use this medicine? Take this medicine by mouth. Follow the directions on the prescription label. Use a specially marked spoon or container to measure your medicine. Ask your pharmacist if you do not have one. Household spoons are not accurate. This medicine can be taken with or without food. Take your medicine at regular intervals. Do not take your medicine more often than directed. Do not stop taking except on your doctor's advice. A special MedGuide will be given to you by the pharmacist with each prescription and refill. Be sure to read this information carefully each time. Talk to your pediatrician regarding the use of this medicine in children. Special care may be needed. Patients over 53 years old may have a stronger reaction and need a smaller dose. Overdosage: If you think you have taken too much of this medicine contact a poison control center or emergency room at once. NOTE: This medicine is only for you. Do not share this medicine with others. What if I miss a dose? If you miss a dose, take it as soon as you can. If it is almost time for your next dose, take only that  dose. Do not take double or extra doses. What may interact with this medicine? Do not take this medicine with any of the following medications: -certain diet drugs like dexfenfluramine, fenfluramine, phentermine, sibutramine -cisapride -escitalopram -linezolid -MAOIs like Carbex, Eldepryl, Marplan, Nardil, and Parnate -methylene blue -pimozide -procarbazine -tryptophan This medicine may also interact with the following medications: -amphetamine or dextroamphetamine -arsenic trioxide -aspirin and aspirin-like drugs -carbamazepine -certain medicines for fungal infections like ketoconazole and itraconazole -certain medicines used to treat infections like chloroquine, clarithromycin, erythromycin, pentamidine -certain medicines for irregular heart beat like amiodarone, disopyramide, dofetilide, ibutilide, procainamide, propafenone, quinidine, sotalol -cimetidine -lithium -medicines for depression, anxiety, or psychotic disturbances -medicines for migraine headache like almotriptan, eletriptan, frovatriptan, naratriptan, rizatriptan, sumatriptan, zolmitriptan -medicines that treat or prevent blood clots like warfarin, enoxaparin, and dalteparin -medicines that treat HIV infection or AIDS -methadone -metoprolol -NSAIDs, medicines for pain and inflammation, like ibuprofen or naproxen -omeprazole -posaconazole -St. John's wort -tramadol This list may not describe all possible interactions. Give your health care provider a list of all the medicines, herbs, non-prescription drugs, or dietary supplements you use. Also tell them if you smoke, drink alcohol, or use illegal drugs. Some items may interact with your medicine. What should I watch for while using this medicine? Visit your doctor or health care professional for regular checks on your progress. Continue to take your medicine even if you do not feel better right away. It can take about 4 weeks before you feel the full effect of this  medicine. Patients and their families should watch out for depression or thoughts of suicide that get worse. Also watch out  for sudden or severe changes in feelings such as feeling anxious, agitated, panicky, irritable, hostile, aggressive, impulsive, severely restless, overly excited and hyperactive, or not being able to sleep. If this happens, especially at the beginning of antidepressant treatment or after a change in dose, call your health care professional. If you have been taking this medicine regularly for some time, do not suddenly stop taking it. You must gradually reduce the dose, or your symptoms may get worse. Ask your doctor or health care professional for advice. You may get drowsy or dizzy. Do not drive, use machinery, or do anything that needs mental alertness until you know how this medicine affects you. Do not stand or sit up quickly, especially if you are an older patient. This reduces the risk of dizzy or fainting spells. Alcohol may interfere with the effect of this medicine. Avoid alcoholic drinks. Do not treat yourself for coughs, colds, or allergies without asking your doctor or health care professional for advice. Some ingredients can increase possible side effects. Your mouth may get dry. Chewing sugarless gum or sucking hard candy, and drinking plenty of water will help. What side effects may I notice from receiving this medicine? Side effects that you should report to your doctor or health care professional as soon as possible: -allergic reactions like skin rash, itching or hives, swelling of the face, lips, or tongue -chest pain -confusion -dizziness -fast, irregular heartbeat -fast talking and excited feelings or actions that are out of control -feeling faint or lightheaded, falls -hallucination, loss of contact with reality -seizures -shortness of breath -suicidal thoughts or other mood changes -unusual bleeding or bruising Side effects that usually do not require  medical attention (report to your doctor or health care professional if they continue or are bothersome): -blurred vision -change in appetite -change in sex drive or performance -headache -increased sweating -nausea -trouble sleeping This list may not describe all possible side effects. Call your doctor for medical advice about side effects. You may report side effects to FDA at 1-800-FDA-1088. Where should I keep my medicine? Keep out of reach of children. Store at room temperature between 15 and 30 degrees C (59 and 86 degrees F). Throw away any unused medicine after the expiration date. NOTE: This sheet is a summary. It may not cover all possible information. If you have questions about this medicine, talk to your doctor, pharmacist, or health care provider.  2012, [employer]. (11/04/2009 10:52:57 AM)

## 2011-11-05 NOTE — Progress Notes (Signed)
Melanie [name] is a 36 y.o. who presents today for ongoing weakness.  Pt has a significant PMHx for TIA, seizures, Meningioma s/p resection in 2011.    Today, pt c/o weakness since May, s/p a plasma transfusion.  Has trouble with walking/ADL.  Does get lightheaded, dizziness with everything.   Denies syncopal episodes or LOC at anytime.  Denies having focal weakness or having slurred speech, facial droop.  Sleep-Decreased x 4 months Interest- [place] +  Guilt - Denies Energy - Decreased x 4 months Concentration - Decreased x 4 months Appetite - Decreased x 4 months Psychomotor agitation - Decreased motor activity x 4 months Suicidal Ideations - Denies hurting self / others   Past Medical History  Diagnosis Date  . TIA (transient ischemic attack)   . Seizures   . Brain tumor 2011  . Embolism - blood clot in left ovary  . Meningioma 08/14/2011    S/p craniotomy Encephaloma present non changing on MRI in 08/2011  . Anxiety     History   Social History  . Marital Status: Divorced    Spouse Name: N/A    Number of Children: N/A  . Years of Education: N/A   Occupational History  . Not on file.   Social History Main Topics  . Smoking status: Never Smoker   . Smokeless tobacco: Not on file  . Alcohol Use: No  . Drug Use: No  . Sexually Active: Not on file   Other Topics Concern  . Not on file   Social History Narrative  . No narrative on file    Family History  Problem Relation Age of Onset  . Depression Mother   . Alcohol abuse Sister   . Alcohol abuse Brother   . Alzheimer's disease Maternal Grandmother   . Heart failure Father   . Hypertension Father   . Diabetes Maternal Grandmother   . Stroke Maternal Grandmother     Current outpatient prescriptions:cephALEXin (KEFLEX) 500 MG capsule, Take 500 mg by mouth 3 (three) times daily. For 7 days; Start date 10/24/11, Disp: , Rfl: ;  LORazepam (ATIVAN) 0.5 MG tablet, Take 0.5 mg by mouth at bedtime as needed. For  anxiety/sleep., Disp: , Rfl: ;  Multiple Vitamin (MULTIVITAMIN WITH MINERALS) TABS, Take 1 tablet by mouth daily., Disp: , Rfl:  omeprazole (PRILOSEC) 20 MG capsule, Take 1 capsule (20 mg total) by mouth daily., Disp: 30 capsule, Rfl: 0  Patient Information Form: Screening and ROS  Do you feel safe in relationships? no PHQ-2:positive  Review of Symptoms:  Per HPI  Physical Exam There were no vitals filed for this visit.  Gen: Mild distress, well nourished, well developed HEENT: PERLA, EOMI, [place]/AT, Mydriasis B/L Neck: no JVD Cardio: RRR, No murmurs/gallops/rubs Lungs: CTA, no wheezes, rhonchi, crackles Abd: NABS, soft nontender nondistended MSK: ROM normal, No TTP at joints B/L  Neuro: CN 2-12 intact, MS 5/5 B/L UE and LE, +2 patellar and achilles relfex b/l  Psych: AAO x 3, depressed affect, crying   No results found for this or any previous visit (from the past 72 hour(s)).

## 2011-11-05 NOTE — Telephone Encounter (Signed)
Please call in Rx for Ativan and notify patient.  Please notify patient that diabetes testing requires her to make an appointment in the clinic.  However, based on previous lab work, her glucose was normal, so it would be unlikely she has diabetes.   Please also notify her that new issues cannot be handled over the phone because we need to be able to see her and examine her in person. Please advise her to make an appointment for any concerns or issues.   I look forward to seeing her on 09/04.

## 2011-11-05 NOTE — Telephone Encounter (Signed)
Patient is calling wanting to be tested for Diabetes but there are no orders.  The reason she wants to be tested is because some friends have told her that her symptoms sound like Diabetes and she can go blind from that.  She wants to get moving on this as soon as possible.

## 2011-11-06 LAB — COMPREHENSIVE METABOLIC PANEL
Albumin: 3.7 g/dL (ref 3.4–5.0)
Alkaline Phosphatase: 62 U/L (ref 50–136)
Anion Gap: 2 — ABNORMAL LOW (ref 7–16)
BUN: 7 mg/dL (ref 7–18)
Bilirubin,Total: 0.3 mg/dL (ref 0.2–1.0)
Calcium, Total: 9.1 mg/dL (ref 8.5–10.1)
Creatinine: 0.62 mg/dL (ref 0.60–1.30)
EGFR (Non-African Amer.): >60
Glucose: 89 mg/dL (ref 65–99)
Osmolality: 277 (ref 275–301)
Potassium: 3.7 mmol/L (ref 3.5–5.1)
SGOT(AST): 16 U/L (ref 15–37)

## 2011-11-06 LAB — URINALYSIS, COMPLETE
Bilirubin,UR: NEGATIVE
Ketone: NEGATIVE
Nitrite: NEGATIVE
Ph: 6 (ref 4.5–8.0)
Protein: NEGATIVE
RBC,UR: 1 /HPF (ref 0–5)
WBC UR: 4 /HPF (ref 0–5)

## 2011-11-06 LAB — CBC
HGB: 14.2 g/dL (ref 12.0–16.0)
MCH: 29 pg (ref 26.0–34.0)
MCHC: 33.9 g/dL (ref 32.0–36.0)
MCV: 86 fL (ref 80–100)
Platelet: 252 10*3/uL (ref 150–440)
RDW: 14 % (ref 11.5–14.5)
WBC: 6.1 10*3/uL (ref 3.6–11.0)

## 2011-11-06 LAB — DRUG SCREEN, URINE
Amphetamine Screen, Ur: NEGATIVE
Marijuana Metabolite: NEGATIVE
Methadone: NEGATIVE
Opiates: NEGATIVE
Phencyclidine (PCP): NEGATIVE
Propoxyphene: NEGATIVE

## 2011-11-06 LAB — PREGNANCY, URINE: Pregnancy Test, Urine: NEGATIVE m[IU]/mL

## 2011-11-07 ENCOUNTER — Observation Stay: Payer: Self-pay | Admitting: Internal Medicine

## 2011-11-07 ENCOUNTER — Ambulatory Visit: Payer: Medicare Other | Admitting: Family Medicine

## 2011-11-07 LAB — TROPONIN I
Troponin-I: <0.02 ng/mL
Troponin-I: <0.02 ng/mL
Troponin-I: <0.02 ng/mL

## 2011-11-07 LAB — AMYLASE: Amylase: 44 U/L (ref 25–115)

## 2011-11-07 LAB — CK TOTAL AND CKMB (NOT AT ARMC)
CK, Total: 54 U/L (ref 21–215)
CK, Total: 54 U/L (ref 21–215)

## 2011-11-07 LAB — LIPASE, BLOOD: Lipase: 138 U/L (ref 73–393)

## 2011-11-08 LAB — BASIC METABOLIC PANEL
Anion Gap: 5 — ABNORMAL LOW (ref 7–16)
BUN: 6 mg/dL — ABNORMAL LOW (ref 7–18)
Calcium, Total: 8.6 mg/dL (ref 8.5–10.1)
Chloride: 107 mmol/L (ref 98–107)
Co2: 29 mmol/L (ref 21–32)
EGFR (African American): >60
EGFR (Non-African Amer.): >60
Glucose: 99 mg/dL (ref 65–99)
Osmolality: 279 (ref 275–301)
Potassium: 3.7 mmol/L (ref 3.5–5.1)
Sodium: 141 mmol/L (ref 136–145)

## 2011-11-08 LAB — PRO B NATRIURETIC PEPTIDE: B-Type Natriuretic Peptide: 27 pg/mL (ref 0–125)

## 2011-11-09 ENCOUNTER — Ambulatory Visit (INDEPENDENT_AMBULATORY_CARE_PROVIDER_SITE_OTHER): Payer: Medicare Other | Admitting: General Surgery

## 2011-11-09 ENCOUNTER — Encounter (INDEPENDENT_AMBULATORY_CARE_PROVIDER_SITE_OTHER): Payer: Self-pay | Admitting: General Surgery

## 2011-11-09 VITALS — BP 122/68 | HR 66 | Temp 98.9°F | Resp 14 | Ht 61.0 in | Wt 137.1 lb

## 2011-11-09 DIAGNOSIS — R109 Unspecified abdominal pain: Secondary | ICD-10-CM

## 2011-11-09 NOTE — Progress Notes (Signed)
Patient ID: Melanie [name], female   DOB: 02/14/1976, 36 y.o.   MRN: [id number]  Chief Complaint  Patient presents with  . Cholelithiasis    HPI Melanie [name] is a 36 y.o. female.   HPI  This patient presents for evaluation of abdominal pain, nausea, and blurry vision. She says that she was feeling normal until May when she began having a loss of appetite. Following that she developed abdominal pain which she's describes as starting 2 hours after eating and is worse with eating high carbohydrate meal or greasy foods. She describes this as pain radiating to her back or originating in her right rib cage area which is only relieved with time. She also describes chest pain and blurry vision and she says that when this started at the white part of her eyes were yellow. She says that she has nausea almost always but is mainly in the morning and she has associated fevers and chills although she is not measured any actual temperatures. She denies any blood in the stools or black stools although she says that she has some occasional white stools.  She has been to the emergency room several times for evaluation and she has had several cardiac evaluations for chest pain which been negative and all of her laboratory studies and abdominal imaging has been negative as well. On file, we have a negative ultrasound and negative CT scan but she says that she went to the emergency room at [facility] yesterday and she had an ultrasound which demonstrated gallstones. We do not have access to this report during this visit. She also says that she had an EGD yesterday as well but this report is also not available. She is also seen a neurologist and apparently this workup has been negative as well. She describes intermittent blurry vision which she says is associated with her GI complaints. She also says that she had a "mini stroke" but there is no neurologic evidence of this.  Past Medical History  Diagnosis Date    . TIA (transient ischemic attack)   . Seizures   . Brain tumor 2011  . Embolism - blood clot in left ovary  . Meningioma 08/14/2011    S/p craniotomy Encephaloma present non changing on MRI in 08/2011  . Anxiety   . Gallstones   . Asthma   . Clotting disorder   . Stroke   . Chills   . Unintentional weight loss   . Visual disturbance   . Palpitations   . Abdominal pain   . Rectal bleeding   . Nausea   . Rectal pain   . Blood in urine   . Weakness   . Confusion     Past Surgical History  Procedure Date  . Brain tumor removed 2011  . Ovarian mass removed 2010    left    Family History  Problem Relation Age of Onset  . Depression Mother   . Alcohol abuse Sister   . Alcohol abuse Brother   . Alzheimer's disease Maternal Grandmother   . Diabetes Maternal Grandmother   . Stroke Maternal Grandmother   . Heart failure Father   . Hypertension Father   . Heart disease Father     Social History History  Substance Use Topics  . Smoking status: Never Smoker   . Smokeless tobacco: Not on file  . Alcohol Use: No    No Known Allergies  Current Outpatient Prescriptions  Medication Sig Dispense Refill  . cephALEXin (  KEFLEX) 500 MG capsule Take 500 mg by mouth 3 (three) times daily. For 7 days; Start date 10/24/11      . citalopram (CELEXA) 20 MG tablet Take 1 tablet (20 mg total) by mouth daily.  30 tablet  3  . LORazepam (ATIVAN) 0.5 MG tablet Take 1 tablet (0.5 mg total) by mouth at bedtime as needed. For anxiety/sleep.  15 tablet  0  . Multiple Vitamin (MULTIVITAMIN WITH MINERALS) TABS Take 1 tablet by mouth daily.      . omeprazole (PRILOSEC) 20 MG capsule Take 1 capsule (20 mg total) by mouth daily.  30 capsule  0    Review of Systems Review of Systems All other review of systems negative or noncontributory except as stated in the HPI  Blood pressure 122/68, pulse 66, temperature 98.9 F (37.2 C), temperature source Temporal, resp. rate 14, height 5' 1" (1.549 m),  weight 137 lb 2 oz (62.199 kg), last menstrual period 10/04/2011.  Physical Exam Physical Exam Physical Exam  Nursing note and vitals reviewed. Constitutional: She is oriented to person, place, and time. She appears well-developed and well-nourished. No distress.  HENT:  Head: Normocephalic and atraumatic.  Mouth/Throat: No oropharyngeal exudate.  Eyes: Conjunctivae and EOM are normal. Pupils are equal, round, and reactive to light. Right eye exhibits no discharge. Left eye exhibits no discharge. No scleral icterus.  Neck: Normal range of motion. Neck supple. No tracheal deviation present.  Cardiovascular: Normal rate, regular rhythm, normal heart sounds and intact distal pulses.   Pulmonary/Chest: Effort normal and breath sounds normal. No stridor. No respiratory distress. She has no wheezes.  Abdominal: Soft. Bowel sounds are normal. She exhibits no distension and no mass. There is no tenderness. There is no rebound and no guarding.  Musculoskeletal: Normal range of motion. She exhibits no edema and no tenderness.  Neurological: She is alert and oriented to person, place, and time.  Skin: Skin is warm and dry. No rash noted. She is not diaphoretic. No erythema. No pallor.  Psychiatric: She has a normal mood and affect. Her behavior is normal. Judgment and thought content normal.    Data Reviewed Primary care notes, [place],   Assessment    Abdominal pain and nausea At this time, I'm not sure what is causing her symptoms. She has several symptoms which she complains of today and the problem is that we have no objective evidence as to cause. She came in today for evaluation for possible cholecystectomy,  However, we have no proof that this is from her gallbladder. We are trying to retrieve her ultrasound and EGD reports from [facility]. If she has gallstones, then it might be reasonable to proceed with cholecystectomy as a possible cure for her symptoms. However, much of the discussion  today with counseling regarding the possibility for persistent symptoms if we do proceed with cholecystectomy. We also discussed other risks of cholecystectomy. The risks of infection, bleeding, pain, persistent symptoms, scarring, injury to bowel or bile ducts, retained stone, diarrhea, need for additional procedures, and need for open surgery discussed with the patient. I recommended that she call me back next week to see if we have received the results of her studies so we can further discussed were to go from here. I also explained that her other symptoms such as blurry vision and or strokes are not related to her gallbladder. I recommended that she see an ophthalmologist and followup with her primary care physician for these other complaints. I actually spoke   with her primary care physician today Dr. Oh Park about her care.     Plan    Obtain outside records and then we can discuss surgery later if gallstones are present.       Asiel Chrostowski DAVID 11/09/2011, 1:24 PM    

## 2011-11-10 ENCOUNTER — Emergency Department (HOSPITAL_COMMUNITY)
Admission: EM | Admit: 2011-11-10 | Discharge: 2011-11-10 | Disposition: A | Discharge status: Home / Self Care | Payer: Medicare Other | Attending: Emergency Medicine | Admitting: Emergency Medicine

## 2011-11-10 ENCOUNTER — Encounter (HOSPITAL_COMMUNITY): Payer: Self-pay | Admitting: *Deleted

## 2011-11-10 DIAGNOSIS — F411 Generalized anxiety disorder: Secondary | ICD-10-CM | POA: Insufficient documentation

## 2011-11-10 DIAGNOSIS — Z8673 Personal history of transient ischemic attack (TIA), and cerebral infarction without residual deficits: Secondary | ICD-10-CM | POA: Insufficient documentation

## 2011-11-10 DIAGNOSIS — J45909 Unspecified asthma, uncomplicated: Secondary | ICD-10-CM | POA: Insufficient documentation

## 2011-11-10 DIAGNOSIS — R109 Unspecified abdominal pain: Secondary | ICD-10-CM

## 2011-11-10 DIAGNOSIS — R1011 Right upper quadrant pain: Secondary | ICD-10-CM | POA: Insufficient documentation

## 2011-11-10 DIAGNOSIS — R112 Nausea with vomiting, unspecified: Secondary | ICD-10-CM | POA: Insufficient documentation

## 2011-11-10 LAB — CBC WITH DIFFERENTIAL/PLATELET
Hemoglobin: 14.3 g/dL (ref 12.0–15.0)
Lymphocytes Relative: 29 % (ref 12–46)
Lymphs Abs: 1.7 10*3/uL (ref 0.7–4.0)
MCH: 28.8 pg (ref 26.0–34.0)
MCV: 83.9 fL (ref 78.0–100.0)
Monocytes Relative: 6 % (ref 3–12)
Neutrophils Relative %: 64 % (ref 43–77)
Platelets: 244 10*3/uL (ref 150–400)
RBC: 4.96 MIL/uL (ref 3.87–5.11)
WBC: 6 10*3/uL (ref 4.0–10.5)

## 2011-11-10 LAB — POCT PREGNANCY, URINE: Preg Test, Ur: NEGATIVE

## 2011-11-10 LAB — URINALYSIS, ROUTINE W REFLEX MICROSCOPIC
Bilirubin Urine: NEGATIVE
Hgb urine dipstick: NEGATIVE
Nitrite: NEGATIVE
Specific Gravity, Urine: 1.021 (ref 1.005–1.030)
Urobilinogen, UA: 0.2 mg/dL (ref 0.0–1.0)
pH: 6 (ref 5.0–8.0)

## 2011-11-10 LAB — COMPREHENSIVE METABOLIC PANEL
ALT: 5 U/L (ref 0–35)
Alkaline Phosphatase: 58 U/L (ref 39–117)
BUN: 8 mg/dL (ref 6–23)
CO2: 26 mEq/L (ref 19–32)
Chloride: 101 mEq/L (ref 96–112)
GFR calc Af Amer: >90 mL/min (ref >90)
GFR calc non Af Amer: >90 mL/min (ref >90)
Glucose, Bld: 86 mg/dL (ref 70–99)
Potassium: 3.5 mEq/L (ref 3.5–5.1)
Sodium: 137 mEq/L (ref 135–145)
Total Bilirubin: 0.3 mg/dL (ref 0.3–1.2)

## 2011-11-10 LAB — URINE MICROSCOPIC-ADD ON

## 2011-11-10 MED ORDER — MORPHINE SULFATE 4 MG/ML IJ SOLN
4.0000 mg | Freq: Once | INTRAMUSCULAR | Status: DC
  Filled 2011-11-10: qty 1

## 2011-11-10 MED ORDER — ONDANSETRON HCL 4 MG/2ML IJ SOLN
4.0000 mg | Freq: Once | INTRAMUSCULAR | Status: DC
  Filled 2011-11-10: qty 2

## 2011-11-10 MED ORDER — SODIUM CHLORIDE 0.9 % IV BOLUS (SEPSIS)
1000.0000 mL | Freq: Once | INTRAVENOUS | Status: AC
  Administered 2011-11-10: 1000 mL via INTRAVENOUS

## 2011-11-10 MED ORDER — HYDROCODONE-ACETAMINOPHEN 5-500 MG PO TABS: 1.0000 | ORAL_TABLET | Freq: Four times a day (QID) | ORAL | Status: DC | PRN

## 2011-11-10 MED ORDER — ONDANSETRON 4 MG PO TBDP: ORAL_TABLET | ORAL | Status: DC

## 2011-11-10 NOTE — ED Provider Notes (Signed)
History     CSN: [id number]  Arrival date & time 11/10/11  1300   First MD Initiated Contact with Patient 11/10/11 1830      Chief Complaint  Patient presents with  . Abdominal Pain    Patient is a 36 year old female with past medical history as listed below who presents with right upper quadrant abdominal pain. Patient states that she has had intermittent episodes of right upper quadrant pain for the last several months. She states being seen at outside hospital on Wednesday. She reports that ultrasound was completed and showed gallstones. However there was no indication for surgery and patient was discharged yesterday. Patient states she has had continued amounts of right upper quadrant pain. The pain is severe, sharp, and nonradiating. Associated symptoms include nausea and 2 episodes of nonbilious nonbloody emesis. Patient denies any changes in bowel movements, fever, or other symptoms. Review of patient's medical record shows that she has been seen multiple times recently in our emergency department for abdominal pain. She had a right upper quadrant ultrasound on August 20 that showed no abnormalities including no gallstones.   (Consider location/radiation/quality/duration/timing/severity/associated sxs/prior treatment) Patient is a 36 y.o. female presenting with abdominal pain. The history is provided by the patient and medical records. No language interpreter was used.  Abdominal Pain The primary symptoms of the illness include abdominal pain. The current episode started more than 2 days ago. The onset of the illness was sudden. The problem has not changed since onset. The abdominal pain began more than 2 days ago. The pain came on suddenly. The abdominal pain has been unchanged since its onset. The abdominal pain is located in the RUQ. The abdominal pain does not radiate. The severity of the abdominal pain is 10/10. The abdominal pain is relieved by nothing. The abdominal pain is  exacerbated by vomiting.  The patient states that she believes she is currently not pregnant. The patient has not had a change in bowel habit. Additional symptoms associated with the illness include anorexia. Significant associated medical issues include gallstones.    Past Medical History  Diagnosis Date  . TIA (transient ischemic attack)   . Seizures   . Brain tumor 2011  . Embolism - blood clot in left ovary  . Meningioma 08/14/2011    S/p craniotomy Encephaloma present non changing on MRI in 08/2011  . Anxiety   . Gallstones   . Asthma   . Clotting disorder   . Stroke   . Chills   . Unintentional weight loss   . Visual disturbance   . Palpitations   . Abdominal pain   . Rectal bleeding   . Nausea   . Rectal pain   . Blood in urine   . Weakness   . Confusion     Past Surgical History  Procedure Date  . Brain tumor removed 2011  . Ovarian mass removed 2010    left    Family History  Problem Relation Age of Onset  . Depression Mother   . Alcohol abuse Sister   . Alcohol abuse Brother   . Alzheimer's disease Maternal Grandmother   . Diabetes Maternal Grandmother   . Stroke Maternal Grandmother   . Heart failure Father   . Hypertension Father   . Heart disease Father     History  Substance Use Topics  . Smoking status: Never Smoker   . Smokeless tobacco: Not on file  . Alcohol Use: No    OB History  Grav Para Term Preterm Abortions TAB SAB Ect Mult Living                  Review of Systems  Gastrointestinal: Positive for abdominal pain and anorexia.  All other systems reviewed and are negative.    Allergies  Review of patient's allergies indicates no known allergies.  Home Medications   Current Outpatient Rx  Name Route Sig Dispense Refill  . CEPHALEXIN 500 MG PO CAPS Oral Take 500 mg by mouth 3 (three) times daily. For 7 days; Start date 10/24/11    . LORAZEPAM 0.5 MG PO TABS Oral Take 0.5 mg by mouth at bedtime as needed. For anxiety/sleep.     . ADULT MULTIVITAMIN W/MINERALS CH Oral Take 1 tablet by mouth daily.      BP 118/75  Pulse 76  Temp 98.8 F (37.1 C) (Oral)  Resp 16  SpO2 98%  LMP 10/04/2011  Physical Exam  Constitutional: She is oriented to person, place, and time. She appears well-developed and well-nourished. No distress.  HENT:  Head: Normocephalic and atraumatic.  Right Ear: External ear normal.  Left Ear: External ear normal.  Nose: Nose normal.  Mouth/Throat: Oropharynx is clear and moist.  Eyes: Conjunctivae and EOM are normal. Pupils are equal, round, and reactive to light.  Neck: Normal range of motion. Neck supple.  Cardiovascular: Normal rate, regular rhythm, normal heart sounds and intact distal pulses.  Exam reveals no gallop and no friction rub.   No murmur heard. Pulmonary/Chest: Effort normal and breath sounds normal. No respiratory distress. She has no rales. She exhibits no tenderness.  Abdominal: Soft. Bowel sounds are normal. She exhibits no distension and no mass. There is tenderness (moderate TTP over RUQ). There is no rebound and no guarding.  Musculoskeletal: Normal range of motion. She exhibits no edema and no tenderness.  Neurological: She is alert and oriented to person, place, and time. She has normal reflexes.  Skin: Skin is warm and dry.  Psychiatric: She has a normal mood and affect.    ED Course  Procedures (including critical care time)  Labs Reviewed  URINALYSIS, ROUTINE W REFLEX MICROSCOPIC - Abnormal; Notable for the following:    APPearance CLOUDY (*)     Ketones, ur 40 (*)     Leukocytes, UA SMALL (*)     All other components within normal limits  URINE MICROSCOPIC-ADD ON - Abnormal; Notable for the following:    Squamous Epithelial / LPF MANY (*)     Bacteria, UA MANY (*)     All other components within normal limits  CBC WITH DIFFERENTIAL  COMPREHENSIVE METABOLIC PANEL  POCT PREGNANCY, URINE  LIPASE, BLOOD   No results found.   1. Abdominal pain   2.  Nausea & vomiting       MDM    Patient is a 36 year old female with past medical history as listed above who presents with right upper quadrant pain, decreased PO intake, and nausea/vomiting. Upon arrival in the emergency department patient's vital signs remarkable for an initial heart rate of 101, however during my exam heart rate noted to be in the 80s. On exam patient with right upper quadrant tenderness to palpation.  Otherwise exam as above and noncontributory.  Review of patient's labs including lipase and urine were unremarkable. Due to reported recent ultrasound at outside hospital showing cholelithiasis records were requested. In outside hospitals documents there was mention of ultrasound being completed showing cholelithiasis. However there was no formal report  from ultrasound sent. With patient's vital signs being within normal limits, no evidence of leukocytosis, and no evidence of biliary obstruction it was felt that patient could be discharged home. Clinical presentation could be due to cholelithiasis however with no indication of cholecystitis it was felt that patient could be sent home with outpatient surgical followup. While in the emergency department patient was given IV fluids Zofran and morphine. Status post treatment she noted her pain had nearly resolved and she was able to tolerate by mouth intake.        Johnney Ou, MD 11/11/11 [phone]

## 2011-11-10 NOTE — ED Notes (Signed)
Pt is here with diagnosed gallstones and nausea.  Pt has 10/10 pain.  LMP 1 week ago.

## 2011-11-13 ENCOUNTER — Telehealth (INDEPENDENT_AMBULATORY_CARE_PROVIDER_SITE_OTHER): Payer: Self-pay

## 2011-11-13 ENCOUNTER — Other Ambulatory Visit (INDEPENDENT_AMBULATORY_CARE_PROVIDER_SITE_OTHER): Payer: Self-pay | Admitting: General Surgery

## 2011-11-13 DIAGNOSIS — K8020 Calculus of gallbladder without cholecystitis without obstruction: Secondary | ICD-10-CM

## 2011-11-13 LAB — PATHOLOGY REPORT

## 2011-11-13 NOTE — Telephone Encounter (Signed)
Pt calling in b/c she is wanting to get her gallbladder surgery today b/c she is stating that she is so sick. The pt was seen on Friday by Dr Biagio Quint and then the pt ended up in the ER on Saturday but was discharged b/c they did not have her records from [facility]. The note from Friday after seeing Dr Biagio Quint says the same thing that he does not know if her symtoms are gallbladder related until he recieves the records from [place]. I told the pt that I would call Dr Biagio Quint but he is in surgery all this week he is not in the office but the pt is demanding to see someone in the office today she doesn't care who it is b/c she just wants to have surgery. The pt keeps telling me that she is having muscle weakness, can't eat,can't function b/c of the gallbladder. This has been going on for several years. I advised pt to call [facility] to get her records to [place] and that I would page Dr Biagio Quint.  I called Dr Bill Salinas and he agreed that he was not going to do anything with the pt until her got those records from [place]. Dr Biagio Quint wants proof that she does have gallstones b/c most of her studies that he could review everything was normal. Dr Biagio Quint advised that if pt can't receive her records that we could offer for pt to get another abdominal u/s ordered. I will talk to the pt.  I called the pt back to let her know we still haven't received the records from [place] and the pt had just called them again. [place] stated they did fax the records. I told the pt again after speaking with Dr Biagio Quint that he was not going to do anything unitl he reviews the records b/c he needs more information to go about the gallbladder. The pt's records did come in from [place] and I will page Dr Biagio Quint again.  Dr Biagio Quint called me back and I read the abdominal u/s report to him which shows the u/s report done on 8/29 showing positive for gallstones. Dr Biagio Quint said we could have her speak to the surgery schedulers to get her  scheduled for an elective surgery. I will notifiy the pt.  I called the pt back to let her know that Dr Biagio Quint was made aware of the results of the u/s and he advised we could proceed with scheduling the gallbladder surgery as an elective case. Dr Biagio Quint does not have any time for this week b/c he is on call the whole week at the hospital. I will have the surgery schedulers work on getting her scheduled for surgery.

## 2011-11-14 ENCOUNTER — Telehealth (INDEPENDENT_AMBULATORY_CARE_PROVIDER_SITE_OTHER): Payer: Self-pay | Admitting: General Surgery

## 2011-11-14 ENCOUNTER — Emergency Department (HOSPITAL_COMMUNITY)
Admission: EM | Admit: 2011-11-14 | Discharge: 2011-11-15 | Disposition: A | Discharge status: Home / Self Care | Payer: Medicare Other | Attending: Emergency Medicine | Admitting: Emergency Medicine

## 2011-11-14 ENCOUNTER — Encounter (HOSPITAL_COMMUNITY): Payer: Self-pay | Admitting: Emergency Medicine

## 2011-11-14 ENCOUNTER — Inpatient Hospital Stay: Payer: Medicare Other | Admitting: Family Medicine

## 2011-11-14 ENCOUNTER — Telehealth (INDEPENDENT_AMBULATORY_CARE_PROVIDER_SITE_OTHER): Payer: Self-pay

## 2011-11-14 DIAGNOSIS — J45909 Unspecified asthma, uncomplicated: Secondary | ICD-10-CM | POA: Insufficient documentation

## 2011-11-14 DIAGNOSIS — Z8249 Family history of ischemic heart disease and other diseases of the circulatory system: Secondary | ICD-10-CM | POA: Insufficient documentation

## 2011-11-14 DIAGNOSIS — Z833 Family history of diabetes mellitus: Secondary | ICD-10-CM | POA: Insufficient documentation

## 2011-11-14 DIAGNOSIS — Z6379 Other stressful life events affecting family and household: Secondary | ICD-10-CM | POA: Insufficient documentation

## 2011-11-14 DIAGNOSIS — N390 Urinary tract infection, site not specified: Secondary | ICD-10-CM | POA: Insufficient documentation

## 2011-11-14 DIAGNOSIS — Z823 Family history of stroke: Secondary | ICD-10-CM | POA: Insufficient documentation

## 2011-11-14 DIAGNOSIS — F411 Generalized anxiety disorder: Secondary | ICD-10-CM | POA: Insufficient documentation

## 2011-11-14 DIAGNOSIS — Z8673 Personal history of transient ischemic attack (TIA), and cerebral infarction without residual deficits: Secondary | ICD-10-CM | POA: Insufficient documentation

## 2011-11-14 DIAGNOSIS — Z818 Family history of other mental and behavioral disorders: Secondary | ICD-10-CM | POA: Insufficient documentation

## 2011-11-14 DIAGNOSIS — R55 Syncope and collapse: Secondary | ICD-10-CM | POA: Insufficient documentation

## 2011-11-14 DIAGNOSIS — Z8489 Family history of other specified conditions: Secondary | ICD-10-CM | POA: Insufficient documentation

## 2011-11-14 LAB — COMPREHENSIVE METABOLIC PANEL
ALT: 6 U/L (ref 0–35)
Alkaline Phosphatase: 60 U/L (ref 39–117)
CO2: 25 mEq/L (ref 19–32)
Chloride: 98 mEq/L (ref 96–112)
GFR calc Af Amer: >90 mL/min (ref >90)
Glucose, Bld: 76 mg/dL (ref 70–99)
Potassium: 3.2 mEq/L — ABNORMAL LOW (ref 3.5–5.1)
Sodium: 135 mEq/L (ref 135–145)
Total Bilirubin: 0.4 mg/dL (ref 0.3–1.2)
Total Protein: 8 g/dL (ref 6.0–8.3)

## 2011-11-14 LAB — RAPID URINE DRUG SCREEN, HOSP PERFORMED
Amphetamines: NOT DETECTED
Benzodiazepines: NOT DETECTED
Tetrahydrocannabinol: NOT DETECTED

## 2011-11-14 LAB — CBC WITH DIFFERENTIAL/PLATELET
Basophils Absolute: 0 10*3/uL (ref 0.0–0.1)
Basophils Relative: 0 % (ref 0–1)
Eosinophils Relative: 2 % (ref 0–5)
HCT: 42.7 % (ref 36.0–46.0)
MCHC: 34.9 g/dL (ref 30.0–36.0)
Monocytes Absolute: 0.5 10*3/uL (ref 0.1–1.0)
Neutro Abs: 4.4 10*3/uL (ref 1.7–7.7)
Platelets: 273 10*3/uL (ref 150–400)
RDW: 13 % (ref 11.5–15.5)

## 2011-11-14 LAB — ETHANOL: Alcohol, Ethyl (B): <11 mg/dL (ref 0–11)

## 2011-11-14 LAB — URINE MICROSCOPIC-ADD ON

## 2011-11-14 LAB — URINALYSIS, ROUTINE W REFLEX MICROSCOPIC
Bilirubin Urine: NEGATIVE
Glucose, UA: NEGATIVE mg/dL
Hgb urine dipstick: NEGATIVE
Ketones, ur: >80 mg/dL — AB
pH: 6 (ref 5.0–8.0)

## 2011-11-14 MED ORDER — SODIUM CHLORIDE 0.9 % IV BOLUS (SEPSIS)
1000.0000 mL | Freq: Once | INTRAVENOUS | Status: AC
  Administered 2011-11-14: 1000 mL via INTRAVENOUS

## 2011-11-14 MED ORDER — SODIUM CHLORIDE 0.9 % IV SOLN
INTRAVENOUS | Status: DC
  Administered 2011-11-15: via INTRAVENOUS

## 2011-11-14 MED ORDER — DEXTROSE 5 % IV SOLN
1.0000 g | Freq: Once | INTRAVENOUS | Status: AC
  Administered 2011-11-15: 1 g via INTRAVENOUS
  Filled 2011-11-14: qty 10

## 2011-11-14 NOTE — Telephone Encounter (Signed)
Surgical orders given to OR schedulers, patient aware & is awaiting a call from our office to schedule Elective Laparoscopic Cholecystectomy (possible open) w/IOC.

## 2011-11-14 NOTE — ED Provider Notes (Signed)
History     CSN: [id number]  Arrival date & time 11/14/11  1810   First MD Initiated Contact with Patient 11/14/11 2151      Chief Complaint  Patient presents with  . Near Syncope    (Consider location/radiation/quality/duration/timing/severity/associated sxs/prior treatment) HPI Comments: Melanie [name] is a 36 y.o. Female who presents for syncope today. She also has another syncopal episode several months ago and an ongoing feeling of dizziness, like she will pass out for several months. She denies current nausea, vomiting, fever, or chills. She has ongoing, right upper quadrant, pain, associated with gallbladder disease. She has been recently diagnosed with cholelithiasis , seen the surgeon, and is scheduled for cholecystectomy on 12/03/11. She's been seen by her PCP, and evaluated, with an MRI, of the head for similar symptoms of near syncope. Records from general surgery office, and PCP office are reviewed.  The history is provided by the patient.    Past Medical History  Diagnosis Date  . TIA (transient ischemic attack)   . Seizures   . Brain tumor 2011  . Embolism - blood clot in left ovary  . Meningioma 08/14/2011    S/p craniotomy Encephaloma present non changing on MRI in 08/2011  . Anxiety   . Gallstones   . Asthma   . Clotting disorder   . Stroke   . Chills   . Unintentional weight loss   . Visual disturbance   . Palpitations   . Abdominal pain   . Rectal bleeding   . Nausea   . Rectal pain   . Blood in urine   . Weakness   . Confusion     Past Surgical History  Procedure Date  . Brain tumor removed 2011  . Ovarian mass removed 2010    left    Family History  Problem Relation Age of Onset  . Depression Mother   . Alcohol abuse Sister   . Alcohol abuse Brother   . Alzheimer's disease Maternal Grandmother   . Diabetes Maternal Grandmother   . Stroke Maternal Grandmother   . Heart failure Father   . Hypertension Father   . Heart disease Father       History  Substance Use Topics  . Smoking status: Never Smoker   . Smokeless tobacco: Not on file  . Alcohol Use: No    OB History    Grav Para Term Preterm Abortions TAB SAB Ect Mult Living                  Review of Systems  All other systems reviewed and are negative.    Allergies  Review of patient's allergies indicates no known allergies.  Home Medications   Current Outpatient Rx  Name Route Sig Dispense Refill  . HYDROCODONE-ACETAMINOPHEN 5-500 MG PO TABS Oral Take 1-2 tablets by mouth every 6 (six) hours as needed for pain. 15 tablet 0  . LORAZEPAM 0.5 MG PO TABS Oral Take 0.5 mg by mouth at bedtime as needed. For anxiety/sleep.    . ADULT MULTIVITAMIN W/MINERALS CH Oral Take 1 tablet by mouth daily.    . CEPHALEXIN 500 MG PO CAPS Oral Take 1 capsule (500 mg total) by mouth 4 (four) times daily. 28 capsule 0  . ONDANSETRON HCL 4 MG PO TABS Oral Take 1 tablet (4 mg total) by mouth every 8 (eight) hours as needed for nausea. 5 tablet 0    BP 108/72  Pulse 60  Temp 98.2 F (36.8 C) (  Oral)  Resp 16  SpO2 98%  LMP 10/31/2011  Physical Exam  Nursing note and vitals reviewed. Constitutional: She is oriented to person, place, and time. She appears well-developed and well-nourished.  HENT:  Head: Normocephalic and atraumatic.  Eyes: Conjunctivae and EOM are normal. Pupils are equal, round, and reactive to light.  Neck: Normal range of motion and phonation normal. Neck supple.  Cardiovascular: Normal rate, regular rhythm and intact distal pulses.   Pulmonary/Chest: Effort normal and breath sounds normal. She exhibits no tenderness.  Abdominal: Soft. She exhibits no distension. There is tenderness (Right upper quadrant, moderate). There is no guarding.  Musculoskeletal: Normal range of motion.  Neurological: She is alert and oriented to person, place, and time. She has normal strength. She exhibits normal muscle tone.  Skin: Skin is warm and dry.  Psychiatric:  Her behavior is normal. Judgment and thought content normal.       anxious    ED Course  Procedures (including critical care time)    Date: 11/14/2011  Rate: 82  Rhythm: normal sinus rhythm  QRS Axis: normal  Intervals: normal  ST/T Wave abnormalities: Nonspecific T wave abnormality  Conduction Disutrbances:none  Narrative Interpretation:   Old EKG Reviewed: unchanged     Labs Reviewed  COMPREHENSIVE METABOLIC PANEL - Abnormal; Notable for the following:    Potassium 3.2 (*)     All other components within normal limits  CBC WITH DIFFERENTIAL - Abnormal; Notable for the following:    RBC 5.23 (*)     All other components within normal limits  URINALYSIS, ROUTINE W REFLEX MICROSCOPIC - Abnormal; Notable for the following:    APPearance TURBID (*)     Ketones, ur >80 (*)     Leukocytes, UA LARGE (*)     All other components within normal limits  URINE MICROSCOPIC-ADD ON - Abnormal; Notable for the following:    Squamous Epithelial / LPF MANY (*)     Bacteria, UA MANY (*)     All other components within normal limits  GLUCOSE, CAPILLARY  LIPASE, BLOOD  URINE RAPID DRUG SCREEN (HOSP PERFORMED)  ETHANOL  URINE CULTURE   No results found.   1. Near syncope   2. UTI (lower urinary tract infection)       MDM  Subacute symptoms of near syncope, fully evaluated, previously with ongoing, gallbladder disease. Patient has indicated to her surgeon that she wants her gallbladder taken out immediately. He has not felt that needs to be done. He plans elective surgery for the gallbladder on 12/03/11. Possible incidental UTI. Doubt metabolic instability, serious bacterial infection or impending vascular collapse; the patient is stable for discharge.   Plan: Home Medications- Keflex; Home Treatments- rest, fluids; Recommended follow up- PCP and General Surg. As scheduled and prn          Flint Melter, MD 11/15/11 1119

## 2011-11-14 NOTE — ED Notes (Signed)
Pt alert, arrives from home, c/o "passing out feeling", onset was in May, resp even unlabored, skin pwd, pt arrives via EMS, pt scheduled for "gall bladder sx next week at [name]"

## 2011-11-14 NOTE — Telephone Encounter (Signed)
Patient's son called and use profanity phone call was disconnected

## 2011-11-14 NOTE — ED Notes (Addendum)
Pt states she "blacked out" today while sitting on her bed. Pt states everytime she leans her head back she will start to "black out".  Pt reports being out for a few seconds. Pt also c/o dizziness.

## 2011-11-14 NOTE — ED Notes (Signed)
Pt reports losing 30 lbs in the past two months. Pt is having a hard time eating because of her gall bladder pain. [facility] is where pt will have her surgery

## 2011-11-15 ENCOUNTER — Telehealth: Payer: Self-pay | Admitting: Family Medicine

## 2011-11-15 MED ORDER — ONDANSETRON HCL 4 MG PO TABS: 4.0000 mg | ORAL_TABLET | Freq: Three times a day (TID) | ORAL | Status: DC | PRN

## 2011-11-15 MED ORDER — CEPHALEXIN 500 MG PO CAPS: 500.0000 mg | ORAL_CAPSULE | Freq: Four times a day (QID) | ORAL | Status: DC

## 2011-11-15 NOTE — Telephone Encounter (Signed)
Patient was diagnosed with H Pylori and is very concerned.  She has surgery to have her Gall Bladder scheduled in a couple of weeks.  She went to [facility] ER last night, due to dehyrdation, they gave her IV fluids and antibiotics.  She is worried about how to stay hydrated when she can't eat or drink.

## 2011-11-15 NOTE — Telephone Encounter (Signed)
Spoke with triage nurse, will Rx Zofran for nausea associated with medication administration.  If pt continues with N/V or worsening, she is to come for evaluation.  JB

## 2011-11-15 NOTE — Telephone Encounter (Signed)
Spoke with patient and then  consulted with Dr. Mauricio Po.  Patient has had recent UTI . Didn't finish all of medication .  Had two tabs left. Last week went to [facility]  and was admitted with abdominal issues..   She is scheduled for surgery to remove gallbladder on 09/23. Was called today with pathology report advising she has H pyloic and they will be sending in a  pack of medication for her to take as directed.    Last night went  to [facility] ED due to nausea  and dehydration. Was given IV fluids and was told has UTI and given antibiotic for UTI.  Her concern is that she is so nauseated and has trouble eating anyway that she  will have problems with taking antibiotics. She is concerned she will get diarrhea because she usually does when taking antibiotic.    Dr. Mauricio Po send in RX for Zofran and advised if has diarrhea  needs appointment to be assessed.  Discussed with patient clear liquids to drink and bland type foods.

## 2011-11-16 ENCOUNTER — Ambulatory Visit (INDEPENDENT_AMBULATORY_CARE_PROVIDER_SITE_OTHER): Payer: Medicare Other | Admitting: Family Medicine

## 2011-11-16 ENCOUNTER — Telehealth: Payer: Self-pay | Admitting: Family Medicine

## 2011-11-16 ENCOUNTER — Encounter: Payer: Self-pay | Admitting: Family Medicine

## 2011-11-16 VITALS — BP 121/80 | HR 90 | Ht 61.0 in | Wt 133.0 lb

## 2011-11-16 DIAGNOSIS — A048 Other specified bacterial intestinal infections: Secondary | ICD-10-CM | POA: Insufficient documentation

## 2011-11-16 DIAGNOSIS — G4700 Insomnia, unspecified: Secondary | ICD-10-CM

## 2011-11-16 DIAGNOSIS — N390 Urinary tract infection, site not specified: Secondary | ICD-10-CM

## 2011-11-16 LAB — URINE CULTURE

## 2011-11-16 MED ORDER — LORAZEPAM 0.5 MG PO TABS: 0.5000 mg | ORAL_TABLET | Freq: Every evening | ORAL | Status: DC | PRN

## 2011-11-16 NOTE — Assessment & Plan Note (Addendum)
reassured patient not likley to suffer serious sequelaue from h pylori infection and that best course is to take complete course of prev pak.   NO evidence of serious abdominal pathology needing further work up today. Signed ROI from [place].

## 2011-11-16 NOTE — Progress Notes (Signed)
  Subjective:    Patient ID: Melanie [name], female    DOB: March 28, 1975, 36 y.o.   MRN: [id number]  HPI Here to discuss her concern  About multiple treatments she has received from [facility] and ER.  Patient states she was admitted to [facility] on August 29th  States biopsy showed h pylori and she was asked to start a prev pak triple therapy.  She is very concerned as she has read on the Internet this disorder could cause sepsis.  She also is concerned about treatment for uti prescribed by ER on Sept 4.  Patient states she continues to be symptomatic as she has hematuria, back ain, dysuria, urinary frequency.  ER urine culture negative, no evidence of hematuria.  Has cholecystectomy scheduled for Sept 23   Review of Systems Positive for multiple systems, most notably insomnia, dizziness, panic attacks    Objective:   Physical Exam GEN: Alert & Oriented, Very anxious CV:  Regular Rate & Rhythm, no murmur Respiratory:  Normal work of breathing, CTAB Abd:  + BS, soft, some ttp on RUQ, no rebound or guarding Ext: no pre-tibial edema;       Assessment & Plan:

## 2011-11-16 NOTE — Patient Instructions (Addendum)
I have filled a one time prescription for lorazepam.  I do not think this is a healthy medication for you for the long term and urge you to discuss your mental health and insomnia further with your primary doctor   Decrease Keflex to twice per day for total 7 days  [facility] as prescribed

## 2011-11-16 NOTE — Telephone Encounter (Signed)
Patient is calling about the dose of the meds that were prescribed.  She is "terrified" that the dose will kill her before she can get her gall bladder removed.

## 2011-11-16 NOTE — Assessment & Plan Note (Signed)
Reported needing refill for lorazepam at times for panic attacks, but then states she only needed it for insomnia.  Was recently prescribed celexa by another provider, she did not take it due to concern for side effects.  Suspect significant mental illness is playing a role in her presentation.  Will refill her lorazepam, discussed not likely to be good ong term medications and encouraged her to be open to speaking about getting additional mental health help when she meets her new PCP in 1 week

## 2011-11-16 NOTE — Telephone Encounter (Signed)
Spoke with patient and since she has such anxiety about the diagnosis and treatments of her recent  Illness It would be best if she came to office to discuss with a doctor. She has received these diagnosis at other facilities along with treatment. See phone conversation from yesterday. Appointment.t scheduled for today at 1:30.

## 2011-11-16 NOTE — Assessment & Plan Note (Signed)
Neg urine culture in ER, symptoma so nto clearly correlate to urinary cause.  Patient concerned about taking keflex in addition to prev pac but still wants to be treated.  Encouraged to to decrease keflex to BID dosing instead of QID prescribed.

## 2011-11-17 NOTE — ED Provider Notes (Signed)
I saw and evaluated the patient, reviewed the resident's note and I agree with the findings and plan.   .Face to face Exam:  General:  Awake HEENT:  Atraumatic Resp:  Normal effort Abd:  Nondistended Neuro:No focal weakness Lymph: No adenopathy   Nelia Shi, MD 11/17/11 2250

## 2011-11-20 ENCOUNTER — Ambulatory Visit (INDEPENDENT_AMBULATORY_CARE_PROVIDER_SITE_OTHER): Payer: Medicare Other | Admitting: General Surgery

## 2011-11-20 ENCOUNTER — Emergency Department (HOSPITAL_COMMUNITY): Payer: Medicare Other

## 2011-11-20 ENCOUNTER — Encounter (HOSPITAL_COMMUNITY): Payer: Self-pay | Admitting: *Deleted

## 2011-11-20 ENCOUNTER — Emergency Department (HOSPITAL_COMMUNITY)
Admission: EM | Admit: 2011-11-20 | Discharge: 2011-11-20 | Disposition: A | Discharge status: Home / Self Care | Payer: Medicare Other | Attending: Emergency Medicine | Admitting: Emergency Medicine

## 2011-11-20 DIAGNOSIS — Z8673 Personal history of transient ischemic attack (TIA), and cerebral infarction without residual deficits: Secondary | ICD-10-CM | POA: Insufficient documentation

## 2011-11-20 DIAGNOSIS — E860 Dehydration: Secondary | ICD-10-CM

## 2011-11-20 DIAGNOSIS — J45909 Unspecified asthma, uncomplicated: Secondary | ICD-10-CM | POA: Insufficient documentation

## 2011-11-20 DIAGNOSIS — K8020 Calculus of gallbladder without cholecystitis without obstruction: Secondary | ICD-10-CM

## 2011-11-20 DIAGNOSIS — R42 Dizziness and giddiness: Secondary | ICD-10-CM | POA: Insufficient documentation

## 2011-11-20 LAB — URINALYSIS, ROUTINE W REFLEX MICROSCOPIC
Hgb urine dipstick: NEGATIVE
Nitrite: NEGATIVE
Specific Gravity, Urine: 1.015 (ref 1.005–1.030)
Urobilinogen, UA: 0.2 mg/dL (ref 0.0–1.0)

## 2011-11-20 LAB — CBC WITH DIFFERENTIAL/PLATELET
Lymphocytes Relative: 22 % (ref 12–46)
Lymphs Abs: 1.6 10*3/uL (ref 0.7–4.0)
MCV: 81.3 fL (ref 78.0–100.0)
Neutrophils Relative %: 70 % (ref 43–77)
Platelets: 238 10*3/uL (ref 150–400)
RBC: 5.14 MIL/uL — ABNORMAL HIGH (ref 3.87–5.11)
WBC: 7.5 10*3/uL (ref 4.0–10.5)

## 2011-11-20 LAB — COMPREHENSIVE METABOLIC PANEL
ALT: 8 U/L (ref 0–35)
Alkaline Phosphatase: 53 U/L (ref 39–117)
CO2: 21 mEq/L (ref 19–32)
Chloride: 100 mEq/L (ref 96–112)
GFR calc Af Amer: >90 mL/min (ref >90)
GFR calc non Af Amer: >90 mL/min (ref >90)
Glucose, Bld: 89 mg/dL (ref 70–99)
Potassium: 3.2 mEq/L — ABNORMAL LOW (ref 3.5–5.1)
Sodium: 136 mEq/L (ref 135–145)

## 2011-11-20 MED ORDER — SODIUM CHLORIDE 0.9 % IV BOLUS (SEPSIS)
1000.0000 mL | Freq: Once | INTRAVENOUS | Status: AC
  Administered 2011-11-20: 1000 mL via INTRAVENOUS

## 2011-11-20 MED ORDER — SODIUM CHLORIDE 0.9 % IV SOLN: INTRAVENOUS | Status: DC

## 2011-11-20 NOTE — ED Notes (Addendum)
Pt reports she has been dx with h.pylori and uti, pt states she is taking medications for the same. Pt states she has infection all over here. States she is set up for surgery in 2 weeks. Pt states she has been taking medications as rx'd and has been following up with all of her appts as instructed. Pt wants to be checked for uti, hyplori, and states that her skin looks jauniced. Pt reports dizziness and feels like she is going to pass out. Denies fevers.

## 2011-11-20 NOTE — ED Notes (Signed)
Pt given emesis bag for nausea  

## 2011-11-20 NOTE — ED Provider Notes (Signed)
History     CSN: [id number]  Arrival date & time 11/20/11  1357   First MD Initiated Contact with Patient 11/20/11 1650      Chief Complaint  Patient presents with  . Flank Pain  . Dizziness    (Consider location/radiation/quality/duration/timing/severity/associated sxs/prior treatment) HPI Comments: States that she was recently diagnosed with H. pylori impact his care started Prevpac. Also 4 days ago she started taking Keflex for UTI however negative urine cultures.  Persistent lightheadedness and feelings of near syncope  Patient is a 36 y.o. female presenting with flank pain. The history is provided by the patient.  Flank Pain This is a chronic problem. Episode onset: months. The problem occurs constantly. The problem has not changed since onset.Associated symptoms include abdominal pain and headaches. Pertinent negatives include no chest pain and no shortness of breath. The symptoms are aggravated by eating. Nothing relieves the symptoms. Treatments tried: vicodin. The treatment provided no relief.    Past Medical History  Diagnosis Date  . TIA (transient ischemic attack)   . Seizures   . Brain tumor 2011  . Embolism - blood clot in left ovary  . Meningioma 08/14/2011    S/p craniotomy Encephaloma present non changing on MRI in 08/2011  . Anxiety   . Gallstones   . Asthma   . Clotting disorder   . Stroke   . Chills   . Unintentional weight loss   . Visual disturbance   . Palpitations   . Abdominal pain   . Rectal bleeding   . Nausea   . Rectal pain   . Blood in urine   . Weakness   . Confusion     Past Surgical History  Procedure Date  . Brain tumor removed 2011  . Ovarian mass removed 2010    left    Family History  Problem Relation Age of Onset  . Depression Mother   . Alcohol abuse Sister   . Alcohol abuse Brother   . Alzheimer's disease Maternal Grandmother   . Diabetes Maternal Grandmother   . Stroke Maternal Grandmother   . Heart failure Father     . Hypertension Father   . Heart disease Father     History  Substance Use Topics  . Smoking status: Never Smoker   . Smokeless tobacco: Not on file  . Alcohol Use: No    OB History    Grav Para Term Preterm Abortions TAB SAB Ect Mult Living                  Review of Systems  Constitutional: Positive for chills and appetite change. Negative for fever.  Eyes: Positive for visual disturbance.  Respiratory: Negative for cough and shortness of breath.   Cardiovascular: Negative for chest pain.  Gastrointestinal: Positive for nausea and abdominal pain. Negative for vomiting and diarrhea.  Genitourinary: Positive for flank pain.  Neurological: Positive for light-headedness and headaches. Negative for weakness.  All other systems reviewed and are negative.    Allergies  Review of patient's allergies indicates no known allergies.  Home Medications   Current Outpatient Rx  Name Route Sig Dispense Refill  . AMOXICILL-CLARITHRO-LANSOPRAZ PO MISC Oral Take by mouth 2 (two) times daily. For 14 days; Started on 11-16-11  Follow package directions.    . CEPHALEXIN 500 MG PO CAPS Oral Take 500 mg by mouth 4 (four) times daily. For 10 days; started on 11-15-11    . LORAZEPAM 0.5 MG PO TABS Oral Take 0.25  mg by mouth at bedtime as needed. For anxiety/sleep.    . ADULT MULTIVITAMIN W/MINERALS CH Oral Take 1 tablet by mouth daily.      BP 122/89  Pulse 88  Temp 97.9 F (36.6 C) (Oral)  Resp 16  SpO2 95%  LMP 10/31/2011  Physical Exam  Nursing note and vitals reviewed. Constitutional: She is oriented to person, place, and time. She appears well-developed and well-nourished. No distress.       Anxious on exam  HENT:  Head: Normocephalic and atraumatic.  Eyes: EOM are normal. Pupils are equal, round, and reactive to light.  Cardiovascular: Normal rate, regular rhythm, normal heart sounds and intact distal pulses.  Exam reveals no friction rub.   No murmur heard. Pulmonary/Chest:  Effort normal and breath sounds normal. She has no wheezes. She has no rales.  Abdominal: Soft. Bowel sounds are normal. She exhibits no distension. There is no hepatosplenomegaly. There is tenderness in the right upper quadrant. There is CVA tenderness and positive Murphy's sign. There is no rebound and no guarding.       Right CVA tenderness  Musculoskeletal: Normal range of motion. She exhibits no tenderness.       No edema  Neurological: She is alert and oriented to person, place, and time. No cranial nerve deficit.  Skin: Skin is warm and dry. No rash noted.  Psychiatric: Her behavior is normal. Her mood appears anxious.    ED Course  Procedures (including critical care time)  Labs Reviewed  URINALYSIS, ROUTINE W REFLEX MICROSCOPIC - Abnormal; Notable for the following:    Ketones, ur >80 (*)     All other components within normal limits  CBC WITH DIFFERENTIAL - Abnormal; Notable for the following:    RBC 5.14 (*)     All other components within normal limits  COMPREHENSIVE METABOLIC PANEL - Abnormal; Notable for the following:    Potassium 3.2 (*)     All other components within normal limits  LIPASE, BLOOD   US Abdomen Complete  11/20/2011  *RADIOLOGY REPORT*  Clinical Data:  Nausea.  Clinical concern for cholecystitis.  COMPLETE ABDOMINAL ULTRASOUND  Comparison:  10/25/2011.  Findings:  Gallbladder:  Interval visualization of two small, mobile gallstones.  One measures 5 mm in maximum diameter and the other measures 2 mm in maximum diameter.  No gallbladder wall thickening or pericholecystic fluid.  The patient was focally tender over the gallbladder today.  Common bile duct:  Normal, measuring 3.2 mm in diameter proximally.  Liver:  Normal.  IVC:  Normal proximally.  Pancreas:  Normal  Spleen:  Normal, measuring 6.1 cm in length.  Right Kidney:  Normal, measuring 9.5 cm in length.  Left Kidney:  Normal, measuring 11.0 cm in length.  Abdominal aorta:  Normal.  IMPRESSION: Interval  visualization of two small gallstones in the gallbladder. The patient is also focally tender over the gallbladder. Otherwise, normal examination.   Original Report Authenticated By: Darrol Angel, M.D.      No diagnosis found.    MDM   Patient with multiple complaints and multiple ER and primary care visits. She states she was recently started on Prevpac for endoscopy proven H. pylori. Also she states she was diagnosed with a UTI 5 days ago for which she is taking Keflex. However her urinary cultures were negative and based on her PCP note in the chart they recommended she stopped taking Keflex. Patient is also complaining of right upper quadrant pain that is worsening  and has a reported history of gallstones found in [facility] but there is no record of this.  Patient has a scheduled cholecystectomy at the end of this month. Her labs today including CBC, CMP, lipase are within normal limits. UA is negative for any sign of infection but does show dehydration with ketone greater than 80.  Will give patient IV fluids as she states she has not been eating well. She defers for any pain or nausea and medication.  Will repeat ultrasound to further evaluate the gallbladder.  Also patient is complaining of headaches and blurry vision however looking back in that she's complained about the symptoms for months now. In the last one month she's had a CT of her head and an MRI. She denies any new trauma or does have a history of of removal of a meningioma. However with a normal CT and MRI done last month ago low suspicion for regrowth of the meningioma.  7:17 PM Ultrasound without any suggestive findings of cholecystitis. She does have 2 normal gallstones without any sign of liver elevation with impacted stones. After findings were discussed with patient she felt much better after IV fluids. Will discharge patient home to followup with surgery as planned.     Gwyneth Sprout, MD 11/20/11 1918

## 2011-11-21 ENCOUNTER — Emergency Department: Payer: Self-pay | Admitting: Emergency Medicine

## 2011-11-21 LAB — CBC
HCT: 41.9 % (ref 35.0–47.0)
HGB: 14.6 g/dL (ref 12.0–16.0)
MCH: 29 pg (ref 26.0–34.0)
MCHC: 34.8 g/dL (ref 32.0–36.0)
MCV: 84 fL (ref 80–100)
Platelet: 236 10*3/uL (ref 150–440)
RDW: 14.1 % (ref 11.5–14.5)
WBC: 4.9 10*3/uL (ref 3.6–11.0)

## 2011-11-21 LAB — COMPREHENSIVE METABOLIC PANEL
Albumin: 3.9 g/dL (ref 3.4–5.0)
Alkaline Phosphatase: 58 U/L (ref 50–136)
Anion Gap: 11 (ref 7–16)
Bilirubin,Total: 0.5 mg/dL (ref 0.2–1.0)
Calcium, Total: 8.9 mg/dL (ref 8.5–10.1)
EGFR (African American): >60
Glucose: 83 mg/dL (ref 65–99)
Potassium: 3.3 mmol/L — ABNORMAL LOW (ref 3.5–5.1)
SGOT(AST): 11 U/L — ABNORMAL LOW (ref 15–37)
SGPT (ALT): 12 U/L (ref 12–78)

## 2011-11-21 LAB — URINALYSIS, COMPLETE
Blood: NEGATIVE
Glucose,UR: NEGATIVE mg/dL (ref 0–75)
Nitrite: NEGATIVE
Ph: 6 (ref 4.5–8.0)
Specific Gravity: 1.024 (ref 1.003–1.030)
Squamous Epithelial: 8
WBC UR: 3 /HPF (ref 0–5)

## 2011-11-21 LAB — LIPASE, BLOOD: Lipase: 217 U/L (ref 73–393)

## 2011-11-26 ENCOUNTER — Inpatient Hospital Stay: Payer: Medicare Other | Admitting: Family Medicine

## 2011-11-26 ENCOUNTER — Emergency Department (HOSPITAL_COMMUNITY): Payer: Medicare Other

## 2011-11-26 ENCOUNTER — Encounter (HOSPITAL_COMMUNITY): Payer: Self-pay | Admitting: *Deleted

## 2011-11-26 ENCOUNTER — Other Ambulatory Visit: Payer: Self-pay

## 2011-11-26 ENCOUNTER — Emergency Department (HOSPITAL_COMMUNITY)
Admission: EM | Admit: 2011-11-26 | Discharge: 2011-11-26 | Disposition: A | Discharge status: Home / Self Care | Payer: Medicare Other | Attending: Emergency Medicine | Admitting: Emergency Medicine

## 2011-11-26 DIAGNOSIS — R109 Unspecified abdominal pain: Secondary | ICD-10-CM | POA: Insufficient documentation

## 2011-11-26 DIAGNOSIS — R42 Dizziness and giddiness: Secondary | ICD-10-CM | POA: Insufficient documentation

## 2011-11-26 DIAGNOSIS — R110 Nausea: Secondary | ICD-10-CM | POA: Insufficient documentation

## 2011-11-26 DIAGNOSIS — R51 Headache: Secondary | ICD-10-CM | POA: Insufficient documentation

## 2011-11-26 DIAGNOSIS — Z9889 Other specified postprocedural states: Secondary | ICD-10-CM | POA: Insufficient documentation

## 2011-11-26 LAB — COMPREHENSIVE METABOLIC PANEL
ALT: 8 U/L (ref 0–35)
CO2: 24 mEq/L (ref 19–32)
Calcium: 9.5 mg/dL (ref 8.4–10.5)
Chloride: 98 mEq/L (ref 96–112)
Creatinine, Ser: 0.59 mg/dL (ref 0.50–1.10)
GFR calc Af Amer: >90 mL/min (ref >90)
GFR calc non Af Amer: >90 mL/min (ref >90)
Glucose, Bld: 81 mg/dL (ref 70–99)
Sodium: 137 mEq/L (ref 135–145)
Total Bilirubin: 0.4 mg/dL (ref 0.3–1.2)

## 2011-11-26 LAB — CBC WITH DIFFERENTIAL/PLATELET
Basophils Absolute: 0 10*3/uL (ref 0.0–0.1)
Basophils Relative: 1 % (ref 0–1)
Hemoglobin: 15.3 g/dL — ABNORMAL HIGH (ref 12.0–15.0)
MCHC: 35.4 g/dL (ref 30.0–36.0)
Monocytes Relative: 6 % (ref 3–12)
Neutro Abs: 4.5 10*3/uL (ref 1.7–7.7)
Neutrophils Relative %: 69 % (ref 43–77)
Platelets: 273 10*3/uL (ref 150–400)
RDW: 13.3 % (ref 11.5–15.5)

## 2011-11-26 LAB — URINALYSIS, ROUTINE W REFLEX MICROSCOPIC
Glucose, UA: NEGATIVE mg/dL
Ketones, ur: >80 mg/dL — AB
Protein, ur: NEGATIVE mg/dL

## 2011-11-26 LAB — URINE MICROSCOPIC-ADD ON

## 2011-11-26 MED ORDER — PROMETHAZINE HCL 25 MG/ML IJ SOLN: 12.5000 mg | Freq: Once | INTRAMUSCULAR | Status: DC

## 2011-11-26 MED ORDER — SODIUM CHLORIDE 0.9 % IV BOLUS (SEPSIS)
1000.0000 mL | Freq: Once | INTRAVENOUS | Status: AC
  Administered 2011-11-26: 1000 mL via INTRAVENOUS

## 2011-11-26 MED ORDER — PROMETHAZINE HCL 25 MG/ML IJ SOLN
25.0000 mg | Freq: Once | INTRAMUSCULAR | Status: DC
  Filled 2011-11-26: qty 1

## 2011-11-26 NOTE — ED Notes (Addendum)
Pt reports dizziness since this past May. States that she is supposed to have her gall bladder removed this coming Monday. Supposed to go to Short Stay tomorrow for presurgical workup. States that she gets dizzy after she eats. Called [facility] due to increase in dizziness. Reports nausea with no vomiting. Pt also complaining of headache due to gall bladder pain.

## 2011-11-26 NOTE — Pre-Procedure Instructions (Signed)
20 Melanie [name]  11/26/2011   Your procedure is scheduled on:  Monday December 03, 2011 at 0730 AM  Report to [facility] at 0530 AM.  Call this number if you have problems the morning of surgery: [phone]   Remember:   Do not eat food or drinkAfter Midnight.Suunday    Take these medicines the morning of surgery with A SIP OF WATER: Lorazepam   Do not wear jewelry, make-up or nail polish.  Do not wear lotions, powders, or perfumes. You may wear deodorant.  Do not shave 48 hours prior to surgery. Men may shave face and neck.  Do not bring valuables to the hospital.  Contacts, dentures or bridgework may not be worn into surgery.  Leave suitcase in the car. After surgery it may be brought to your room.  For patients admitted to the hospital, checkout time is 11:00 AM the day of discharge.   Patients discharged the day of surgery will not be allowed to drive home.    Special Instructions: CHG Shower Use Special Wash: 1/2 bottle night before surgery and 1/2 bottle morning of surgery.   Please read over the following fact sheets that you were given: Pain Booklet, Coughing and Deep Breathing, MRSA Information and Surgical Site Infection Prevention

## 2011-11-26 NOTE — ED Provider Notes (Signed)
History     CSN: [id number]  Arrival date & time 11/26/11  1232   First MD Initiated Contact with Patient 11/26/11 1600      Chief Complaint  Patient presents with  . Dizziness    (Consider location/radiation/quality/duration/timing/severity/associated sxs/prior treatment) HPI Melanie [name] Reasonover is a 36 y.o. female presenting with multiple complaints. She does have a pertinent past medical history of an embolism in her left ovary requiring surgical excision is well as a meningioma which is status post craniotomy.  Patient is also scheduled to have her gallbladder out on Monday (7 days.)  She is due to go to short stay tomorrow for presurgical workup. She says she's been getting dizzy after she eats associated with some nausea and no vomiting.  She is afraid that his dizziness is triggering headaches, and is related to her gallbladder pain and that there may be something going on in the left side of her head. She describes her headache as a throbbing, 5-6/10, mild photophobia, not associated with paresthesias or weakness, no visual aura.  Patient did have a history of migraine headaches after having her meningioma surgery.  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   Past Medical History  Diagnosis Date  . TIA (transient ischemic attack)   . Seizures   . Brain tumor 2011  . Embolism - blood clot in left ovary  . Meningioma 08/14/2011    S/p craniotomy Encephaloma present non changing on MRI in 08/2011  . Anxiety   . Gallstones   . Asthma   . Clotting disorder   . Stroke   . Chills   . Unintentional weight loss   . Visual disturbance   . Palpitations   . Abdominal pain   . Rectal bleeding   . Nausea   . Rectal pain   . Blood in urine   . Weakness   . Confusion     Past Surgical History  Procedure Date  . Brain tumor removed 2011  . Ovarian mass removed 2010    left    Family History  Problem Relation Age of Onset    . Depression Mother   . Alcohol abuse Sister   . Alcohol abuse Brother   . Alzheimer's disease Maternal Grandmother   . Diabetes Maternal Grandmother   . Stroke Maternal Grandmother   . Heart failure Father   . Hypertension Father   . Heart disease Father     History  Substance Use Topics  . Smoking status: Never Smoker   . Smokeless tobacco: Not on file  . Alcohol Use: No    OB History    Grav Para Term Preterm Abortions TAB SAB Ect Mult Living                  Review of Systems At least 10pt or greater review of systems completed and are negative except where specified in the HPI.  Allergies  Morphine and related  Home Medications   Current Outpatient Rx  Name Route Sig Dispense Refill  . AMOXICILL-CLARITHRO-LANSOPRAZ PO MISC Oral Take 1 each by mouth 2 (two) times daily. For 14 days; Started on 11-16-11  Follow package directions.    Marland Kitchen LORAZEPAM 0.5 MG PO TABS Oral Take 0.25 mg by mouth at bedtime as needed. For anxiety/sleep.    . ADULT MULTIVITAMIN W/MINERALS CH Oral Take 1 tablet by mouth daily.      BP 116/88  Pulse  95  Temp 98.1 F (36.7 C) (Oral)  Resp 16  Ht 5\' 1"  (1.549 m)  Wt 125 lb (56.7 kg)  BMI 23.62 kg/m2  SpO2 100%  LMP 10/31/2011  Physical Exam  Nursing notes reviewed.  Electronic medical record reviewed. VITAL SIGNS:   Filed Vitals:   11/26/11 1708 11/26/11 1709 11/26/11 1711 11/26/11 1842  BP: 124/72 121/78 121/86 110/64  Pulse: 63 71 73 72  Temp:    98.1 F (36.7 C)  TempSrc:    Oral  Resp: 18 19 19 12   Height:      Weight:      SpO2:  100% 100% 100%   CONSTITUTIONAL: Awake, oriented, appears non-toxic HENT: Atraumatic, normocephalic, oral mucosa pink and moist, airway patent. Nares patent without drainage. External ears normal. EYES: Conjunctiva clear, EOMI, PERRLA NECK: Trachea midline, non-tender, supple CARDIOVASCULAR: Normal heart rate, Normal rhythm, No murmurs, rubs, gallops PULMONARY/CHEST: Clear to auscultation, no  rhonchi, wheezes, or rales. Symmetrical breath sounds. Non-tender. ABDOMINAL: Non-distended, soft, non-tender - no rebound or guarding.  BS normal. NEUROLOGIC: CN: Facial sensation equal to light touch bilaterally.  Good muscle bulk in the masseter muscle and good lateral movement of the jaw.  Facial expressions equal and good strength with smile/frown and puffed cheeks.  Hearing grossly intact to finger rub test.  Uvula, tongue are midline with no deviation. Symmetrical palate elevation.  Trapezius and SCM muscles are 5/5 strength bilaterally.   DTR: Brachioradialis, biceps, patellar, Achilles tendon reflexes 2+ bilaterally.  No clonus. Strength: 5/5 strength flexors and extensors in the upper and lower extremities.  Grip strength, finger adduction/abduction 5/5. Sensation: Sensation intact distally to light touch EXTREMITIES: No clubbing, cyanosis, or edema SKIN: Warm, Dry, No erythema, No rash  ED Course  Procedures (including critical care time)  Labs Reviewed  URINALYSIS, ROUTINE W REFLEX MICROSCOPIC - Abnormal; Notable for the following:    APPearance CLOUDY (*)     Hgb urine dipstick SMALL (*)     Ketones, ur >80 (*)     All other components within normal limits  COMPREHENSIVE METABOLIC PANEL - Abnormal; Notable for the following:    Potassium 3.1 (*)     BUN 4 (*)     All other components within normal limits  CBC WITH DIFFERENTIAL - Abnormal; Notable for the following:    RBC 5.35 (*)     Hemoglobin 15.3 (*)     All other components within normal limits  URINE MICROSCOPIC-ADD ON - Abnormal; Notable for the following:    Squamous Epithelial / LPF MANY (*)     Bacteria, UA FEW (*)     All other components within normal limits  PREGNANCY, URINE   Ct Head Wo Contrast  11/26/2011  *RADIOLOGY REPORT*  Clinical Data: Dizziness today.  History of brain tumor removal.  CT HEAD WITHOUT CONTRAST  Technique:  Contiguous axial images were obtained from the base of the skull through the  vertex without contrast.  Comparison: Most recent CT and MRI 10/21/2011  Findings: There is no evidence for acute infarction, intracranial hemorrhage, mass lesion, hydrocephalus, or extra-axial fluid. Slight left frontotemporal encephalomalacia directly underneath the craniotomy site.  No evidence for recurrent tumor or brain edema on this noncontrast examination.  No scalp hematoma or pseudomeningocele.  Clear sinuses and mastoids. Stable appearance from most recent priors.  IMPRESSION: No acute intracranial abnormality.  No postsurgical complications related to prior meningioma removal.  Stable appearance from priors.   Original Report Authenticated By: Jackquline Denmark.  CURNES, M.D.      1. Headache   2. Abdominal pain       MDM  Melanie [name] is a 36 y.o. female presents with a constellation of symptoms including abdominal pain, dizziness, nausea when eating which she thinks is associated with dizziness. She's afraid that there may be recurrence of tumor.  CT scan is normal and does not show any recurrence of tumor, hemorrhage, hydrocephalus, acute infarction or any extra-axial fluid.  Labs are also unremarkable. Urinalysis is reflective of dehydration supported by a slightly hemoconcentrated hemoglobin of 15.3. Patient has been given IV fluids. Patient does feel better after IV fluids was refused Phenergan. She does have a history of migraine headaches with her craniotomy however says that she does not want take any more medicine because she is "already taking too much".  Her overall presentation is very suggestive of anxiety, and a migraine headache. She's refused treatment for migraine headache and feels better after fluids and wishes to be discharged home. 1 no acute intracranial abnormality, she's got a normal neurologic exam and a benign abdomen. She initially also complained about something and felt "hard" in her abdomen - I performed an abdominal ultrasound looking at her aorta which was less  than 2 cm throughout its length.  The patient can present tomorrow for her preop workup.  Start home stable and good condition.        Jones Skene, MD 11/27/11 [phone]

## 2011-11-27 ENCOUNTER — Encounter (HOSPITAL_COMMUNITY)
Admission: RE | Admit: 2011-11-27 | Discharge: 2011-11-27 | Disposition: A | Discharge status: Home / Self Care | Payer: Medicare Other | Source: Ambulatory Visit | Attending: General Surgery | Admitting: General Surgery

## 2011-11-27 ENCOUNTER — Encounter (HOSPITAL_COMMUNITY): Payer: Self-pay

## 2011-11-27 LAB — SURGICAL PCR SCREEN: MRSA, PCR: NEGATIVE

## 2011-11-27 NOTE — Progress Notes (Addendum)
9/17.Marland KitchenMarland KitchenEXTENSIVE LABS DRAWN 9/16--she came into the ER  C/o dizziness and was worked up...given IV Fluids for dehydration and sent home.  Has multiple complaints....WITH THE EXCEPTION OF SURGICAL PCR....WILL DO ONLY THAT AT PAT VISIT...........Marland KitchenDA  Potassium level on 9/16 was 3.1..Sodium was 137..I spoke with Shonna Chock, PA regarding a repeat K the DOS...Marland Kitchenno new orders.Marland KitchenMarland KitchenDA

## 2011-12-02 MED ORDER — CEFAZOLIN SODIUM-DEXTROSE 2-3 GM-% IV SOLR
2.0000 g | INTRAVENOUS | Status: AC
  Administered 2011-12-03: 2 g via INTRAVENOUS
  Filled 2011-12-02: qty 50

## 2011-12-03 ENCOUNTER — Ambulatory Visit (HOSPITAL_COMMUNITY): Payer: Medicare Other

## 2011-12-03 ENCOUNTER — Other Ambulatory Visit (INDEPENDENT_AMBULATORY_CARE_PROVIDER_SITE_OTHER): Payer: Self-pay

## 2011-12-03 ENCOUNTER — Encounter (HOSPITAL_COMMUNITY): Payer: Self-pay | Admitting: Anesthesiology

## 2011-12-03 ENCOUNTER — Encounter (HOSPITAL_COMMUNITY)
Admission: RE | Disposition: A | Discharge status: Home / Self Care | Payer: Self-pay | Source: Ambulatory Visit | Attending: General Surgery

## 2011-12-03 ENCOUNTER — Other Ambulatory Visit (INDEPENDENT_AMBULATORY_CARE_PROVIDER_SITE_OTHER): Payer: Self-pay | Admitting: General Surgery

## 2011-12-03 ENCOUNTER — Ambulatory Visit (HOSPITAL_COMMUNITY): Payer: Medicare Other | Admitting: Anesthesiology

## 2011-12-03 ENCOUNTER — Encounter (HOSPITAL_COMMUNITY): Payer: Self-pay | Admitting: *Deleted

## 2011-12-03 ENCOUNTER — Ambulatory Visit (HOSPITAL_COMMUNITY)
Admission: RE | Admit: 2011-12-03 | Discharge: 2011-12-05 | Disposition: A | Discharge status: Home / Self Care | Payer: Medicare Other | Source: Ambulatory Visit | Attending: General Surgery | Admitting: General Surgery

## 2011-12-03 DIAGNOSIS — K8050 Calculus of bile duct without cholangitis or cholecystitis without obstruction: Secondary | ICD-10-CM

## 2011-12-03 DIAGNOSIS — K811 Chronic cholecystitis: Secondary | ICD-10-CM

## 2011-12-03 DIAGNOSIS — K8020 Calculus of gallbladder without cholecystitis without obstruction: Secondary | ICD-10-CM | POA: Insufficient documentation

## 2011-12-03 HISTORY — PX: CHOLECYSTECTOMY: SHX55

## 2011-12-03 SURGERY — LAPAROSCOPIC CHOLECYSTECTOMY WITH INTRAOPERATIVE CHOLANGIOGRAM
Anesthesia: General | Site: Abdomen | Wound class: Contaminated

## 2011-12-03 MED ORDER — HYDROMORPHONE HCL PF 1 MG/ML IJ SOLN: 0.5000 mg | INTRAMUSCULAR | Status: DC | PRN

## 2011-12-03 MED ORDER — PROMETHAZINE HCL 25 MG RE SUPP
25.0000 mg | Freq: Four times a day (QID) | RECTAL | Status: AC | PRN
  Administered 2011-12-03: 25 mg via RECTAL
  Filled 2011-12-03: qty 1

## 2011-12-03 MED ORDER — ONDANSETRON HCL 4 MG/2ML IJ SOLN
4.0000 mg | Freq: Once | INTRAMUSCULAR | Status: AC | PRN
  Administered 2011-12-03: 4 mg via INTRAVENOUS

## 2011-12-03 MED ORDER — GLYCOPYRROLATE 0.2 MG/ML IJ SOLN
INTRAMUSCULAR | Status: DC | PRN
  Administered 2011-12-03: 0.4 mg via INTRAVENOUS

## 2011-12-03 MED ORDER — PROPOFOL 10 MG/ML IV BOLUS
INTRAVENOUS | Status: DC | PRN
  Administered 2011-12-03: 130 mg via INTRAVENOUS

## 2011-12-03 MED ORDER — DEXAMETHASONE SODIUM PHOSPHATE 4 MG/ML IJ SOLN
INTRAMUSCULAR | Status: AC
  Filled 2011-12-03: qty 1

## 2011-12-03 MED ORDER — BUPIVACAINE HCL (PF) 0.25 % IJ SOLN
INTRAMUSCULAR | Status: AC
  Filled 2011-12-03: qty 30

## 2011-12-03 MED ORDER — SODIUM CHLORIDE 0.9 % IR SOLN
Status: DC | PRN
  Administered 2011-12-03 (×2): 1

## 2011-12-03 MED ORDER — DEXAMETHASONE SODIUM PHOSPHATE 4 MG/ML IJ SOLN
4.0000 mg | Freq: Once | INTRAMUSCULAR | Status: AC
  Administered 2011-12-03: 4 mg via INTRAVENOUS

## 2011-12-03 MED ORDER — 0.9 % SODIUM CHLORIDE (POUR BTL) OPTIME
TOPICAL | Status: DC | PRN
  Administered 2011-12-03: 1000 mL

## 2011-12-03 MED ORDER — PROMETHAZINE HCL 25 MG/ML IJ SOLN
25.0000 mg | Freq: Four times a day (QID) | INTRAMUSCULAR | Status: DC | PRN
  Filled 2011-12-03: qty 1

## 2011-12-03 MED ORDER — ACETAMINOPHEN 10 MG/ML IV SOLN
1000.0000 mg | Freq: Once | INTRAVENOUS | Status: AC | PRN
  Administered 2011-12-03: 1000 mg via INTRAVENOUS

## 2011-12-03 MED ORDER — ONDANSETRON HCL 4 MG/2ML IJ SOLN
INTRAMUSCULAR | Status: DC | PRN
  Administered 2011-12-03: 4 mg via INTRAVENOUS

## 2011-12-03 MED ORDER — LORAZEPAM 0.5 MG PO TABS
0.2500 mg | ORAL_TABLET | ORAL | Status: DC
  Administered 2011-12-04: 0.25 mg via ORAL
  Filled 2011-12-03: qty 1

## 2011-12-03 MED ORDER — NEOSTIGMINE METHYLSULFATE 1 MG/ML IJ SOLN
INTRAMUSCULAR | Status: DC | PRN
  Administered 2011-12-03: 3 mg via INTRAVENOUS

## 2011-12-03 MED ORDER — HYDROMORPHONE HCL PF 1 MG/ML IJ SOLN
INTRAMUSCULAR | Status: AC
  Filled 2011-12-03: qty 1

## 2011-12-03 MED ORDER — ONDANSETRON HCL 4 MG/2ML IJ SOLN
INTRAMUSCULAR | Status: AC
  Filled 2011-12-03: qty 2

## 2011-12-03 MED ORDER — METOCLOPRAMIDE HCL 5 MG/ML IJ SOLN
INTRAMUSCULAR | Status: AC
  Administered 2011-12-03: 10 mg
  Filled 2011-12-03: qty 2

## 2011-12-03 MED ORDER — KCL IN DEXTROSE-NACL 20-5-0.45 MEQ/L-%-% IV SOLN
INTRAVENOUS | Status: DC
  Administered 2011-12-03: 21:00:00 via INTRAVENOUS
  Administered 2011-12-04 (×2): 125 mL/h via INTRAVENOUS
  Filled 2011-12-03 (×9): qty 1000

## 2011-12-03 MED ORDER — METOCLOPRAMIDE HCL 5 MG/ML IJ SOLN
10.0000 mg | Freq: Four times a day (QID) | INTRAMUSCULAR | Status: AC
  Administered 2011-12-03: 10 mg via INTRAVENOUS

## 2011-12-03 MED ORDER — DROPERIDOL 2.5 MG/ML IJ SOLN: 0.6250 mg | Freq: Once | INTRAMUSCULAR | Status: DC

## 2011-12-03 MED ORDER — HYDROCODONE-ACETAMINOPHEN 5-325 MG PO TABS: 1.0000 | ORAL_TABLET | ORAL | Status: DC | PRN

## 2011-12-03 MED ORDER — HYDROMORPHONE HCL PF 1 MG/ML IJ SOLN
0.2500 mg | INTRAMUSCULAR | Status: DC | PRN
  Administered 2011-12-03 (×2): 0.5 mg via INTRAVENOUS

## 2011-12-03 MED ORDER — ROCURONIUM BROMIDE 100 MG/10ML IV SOLN
INTRAVENOUS | Status: DC | PRN
  Administered 2011-12-03: 40 mg via INTRAVENOUS

## 2011-12-03 MED ORDER — MIDAZOLAM HCL 5 MG/5ML IJ SOLN
INTRAMUSCULAR | Status: DC | PRN
  Administered 2011-12-03 (×2): 1 mg via INTRAVENOUS

## 2011-12-03 MED ORDER — ONDANSETRON HCL 4 MG/2ML IJ SOLN: 4.0000 mg | Freq: Four times a day (QID) | INTRAMUSCULAR | Status: DC | PRN

## 2011-12-03 MED ORDER — LIDOCAINE HCL (CARDIAC) 20 MG/ML IV SOLN
INTRAVENOUS | Status: DC | PRN
  Administered 2011-12-03: 40 mg via INTRAVENOUS

## 2011-12-03 MED ORDER — FENTANYL CITRATE 0.05 MG/ML IJ SOLN
INTRAMUSCULAR | Status: DC | PRN
  Administered 2011-12-03: 100 ug via INTRAVENOUS
  Administered 2011-12-03 (×3): 50 ug via INTRAVENOUS

## 2011-12-03 MED ORDER — ONDANSETRON HCL 4 MG PO TABS: 4.0000 mg | ORAL_TABLET | Freq: Four times a day (QID) | ORAL | Status: DC | PRN

## 2011-12-03 MED ORDER — SODIUM CHLORIDE 0.9 % IV SOLN
INTRAVENOUS | Status: DC | PRN
  Administered 2011-12-03: 09:00:00

## 2011-12-03 MED ORDER — DEXTROSE-NACL 5-0.9 % IV SOLN
INTRAVENOUS | Status: DC
  Administered 2011-12-03: 13:00:00 via INTRAVENOUS

## 2011-12-03 MED ORDER — ENOXAPARIN SODIUM 40 MG/0.4ML ~~LOC~~ SOLN
40.0000 mg | SUBCUTANEOUS | Status: DC
  Administered 2011-12-03 – 2011-12-04 (×2): 40 mg via SUBCUTANEOUS
  Filled 2011-12-03 (×4): qty 0.4

## 2011-12-03 MED ORDER — LIDOCAINE-EPINEPHRINE (PF) 1 %-1:200000 IJ SOLN
INTRAMUSCULAR | Status: DC | PRN
  Administered 2011-12-03: 09:00:00

## 2011-12-03 MED ORDER — LORAZEPAM 0.5 MG PO TABS: 0.2500 mg | ORAL_TABLET | Freq: Every day | ORAL | Status: DC

## 2011-12-03 MED ORDER — LIDOCAINE-EPINEPHRINE (PF) 1 %-1:200000 IJ SOLN
INTRAMUSCULAR | Status: AC
  Filled 2011-12-03: qty 10

## 2011-12-03 MED ORDER — LORAZEPAM 0.5 MG PO TABS
0.2500 mg | ORAL_TABLET | ORAL | Status: AC
  Administered 2011-12-03: 0.25 mg via ORAL
  Filled 2011-12-03: qty 1

## 2011-12-03 MED ORDER — ACETAMINOPHEN 10 MG/ML IV SOLN
INTRAVENOUS | Status: AC
  Filled 2011-12-03: qty 100

## 2011-12-03 MED ORDER — LACTATED RINGERS IV SOLN
INTRAVENOUS | Status: DC | PRN
  Administered 2011-12-03 (×2): via INTRAVENOUS

## 2011-12-03 SURGICAL SUPPLY — 45 items
APPLIER CLIP ROT 10 11.4 M/L (STAPLE) ×2
BLADE SURG ROTATE 9660 (MISCELLANEOUS) IMPLANT
CANISTER SUCTION 2500CC (MISCELLANEOUS) ×2 IMPLANT
CATH REDDICK CHOLANGI 4FR 50CM (CATHETERS) ×2 IMPLANT
CHLORAPREP W/TINT 26ML (MISCELLANEOUS) ×2 IMPLANT
CLIP APPLIE ROT 10 11.4 M/L (STAPLE) ×1 IMPLANT
CLOTH BEACON ORANGE TIMEOUT ST (SAFETY) ×2 IMPLANT
COVER SURGICAL LIGHT HANDLE (MISCELLANEOUS) ×2 IMPLANT
DECANTER SPIKE VIAL GLASS SM (MISCELLANEOUS) IMPLANT
DERMABOND ADVANCED (GAUZE/BANDAGES/DRESSINGS) ×2
DERMABOND ADVANCED .7 DNX12 (GAUZE/BANDAGES/DRESSINGS) ×2 IMPLANT
DRAPE C-ARM 42X72 X-RAY (DRAPES) ×2 IMPLANT
ELECT CAUTERY BLADE 6.4 (BLADE) ×2 IMPLANT
ELECT REM PT RETURN 9FT ADLT (ELECTROSURGICAL) ×2
ELECTRODE REM PT RTRN 9FT ADLT (ELECTROSURGICAL) ×1 IMPLANT
GLOVE BIO SURGEON STRL SZ 6.5 (GLOVE) ×2 IMPLANT
GLOVE BIO SURGEON STRL SZ7.5 (GLOVE) ×2 IMPLANT
GLOVE BIOGEL PI IND STRL 7.0 (GLOVE) ×1 IMPLANT
GLOVE BIOGEL PI IND STRL 7.5 (GLOVE) ×1 IMPLANT
GLOVE BIOGEL PI INDICATOR 7.0 (GLOVE) ×1
GLOVE BIOGEL PI INDICATOR 7.5 (GLOVE) ×1
GLOVE ORTHOPEDIC STR SZ6.5 (GLOVE) ×2 IMPLANT
GLOVE SURG SS PI 7.5 STRL IVOR (GLOVE) ×4 IMPLANT
GOWN STRL NON-REIN LRG LVL3 (GOWN DISPOSABLE) ×8 IMPLANT
GOWN STRL REIN XL XLG (GOWN DISPOSABLE) ×2 IMPLANT
IV CATH 14GX2 1/4 (CATHETERS) ×2 IMPLANT
KIT BASIN OR (CUSTOM PROCEDURE TRAY) ×2 IMPLANT
KIT ROOM TURNOVER OR (KITS) ×2 IMPLANT
NS IRRIG 1000ML POUR BTL (IV SOLUTION) ×4 IMPLANT
PAD ARMBOARD 7.5X6 YLW CONV (MISCELLANEOUS) ×2 IMPLANT
PENCIL BUTTON HOLSTER BLD 10FT (ELECTRODE) ×2 IMPLANT
POUCH SPECIMEN RETRIEVAL 10MM (ENDOMECHANICALS) ×2 IMPLANT
SCISSORS LAP 5X35 DISP (ENDOMECHANICALS) IMPLANT
SET IRRIG TUBING LAPAROSCOPIC (IRRIGATION / IRRIGATOR) ×2 IMPLANT
SLEEVE ENDOPATH XCEL 5M (ENDOMECHANICALS) ×2 IMPLANT
SPECIMEN JAR SMALL (MISCELLANEOUS) ×2 IMPLANT
SUT MNCRL AB 4-0 PS2 18 (SUTURE) ×4 IMPLANT
SUT VICRYL 0 UR6 27IN ABS (SUTURE) ×2 IMPLANT
TOWEL OR 17X24 6PK STRL BLUE (TOWEL DISPOSABLE) ×2 IMPLANT
TOWEL OR 17X26 10 PK STRL BLUE (TOWEL DISPOSABLE) ×2 IMPLANT
TRAY FOLEY CATH 14FR (SET/KITS/TRAYS/PACK) IMPLANT
TRAY LAPAROSCOPIC (CUSTOM PROCEDURE TRAY) ×2 IMPLANT
TROCAR BALLN 12MMX100 BLUNT (TROCAR) ×2 IMPLANT
TROCAR XCEL NON-BLD 11X100MML (ENDOMECHANICALS) ×2 IMPLANT
TROCAR XCEL NON-BLD 5MMX100MML (ENDOMECHANICALS) ×2 IMPLANT

## 2011-12-03 NOTE — Interval H&P Note (Signed)
History and Physical Interval Note:  12/03/2011 7:18 AM  Melanie [name]  has presented today for surgery, with the diagnosis of cholelithiasis  The various methods of treatment have been discussed with the patient and family. After consideration of risks, benefits and other options for treatment, the patient has consented to  Procedure(s) (LRB) with comments: LAPAROSCOPIC CHOLECYSTECTOMY WITH INTRAOPERATIVE CHOLANGIOGRAM (N/A) - possible open as a surgical intervention .  The patient's history has been reviewed, patient examined, no change in status, stable for surgery.  I have reviewed the patient's chart and labs.  Questions were answered to the patient's satisfaction.   We have since reviewed her records from [facility].  She has an [place] with two small gallstones without inflammatory changes.  She had an essentially normal EGD and she says that she continues to lose weight and cannot eat.  I again discussed with her the risks of the surgery.  The risks of infection, bleeding, pain, persistent symptoms, scarring, injury to bowel or bile ducts, retained stone, diarrhea, need for additional procedures, and need for open surgery discussed with the patient.  I again explained to her that I was skeptical that this was from her gallbladder and there would not be any guarantee of relief of her symptoms.  SHe has multiple other symptoms which would also not be relieved with surgery.  She expressed understanding and desires to proceed with cholecystectomy.   Lodema Pilot DAVID

## 2011-12-03 NOTE — Progress Notes (Signed)
Persistent nausea postop, but little pain.  Will keep tonight and check LFT's in AM as well as MRCP

## 2011-12-03 NOTE — Transfer of Care (Signed)
Immediate Anesthesia Transfer of Care Note  Patient: Melanie [name]  Procedure(s) Performed: Procedure(s) (LRB) with comments: LAPAROSCOPIC CHOLECYSTECTOMY WITH INTRAOPERATIVE CHOLANGIOGRAM (N/A)  Patient Location: PACU  Anesthesia Type: General  Level of Consciousness: awake, alert , oriented and sedated  Airway & Oxygen Therapy: Patient Spontanous Breathing and Patient connected to nasal cannula oxygen  Post-op Assessment: Report given to PACU RN, Post -op Vital signs reviewed and stable and Patient moving all extremities  Post vital signs: Reviewed and stable  Complications: No apparent anesthesia complications

## 2011-12-03 NOTE — Brief Op Note (Signed)
12/03/2011  9:17 AM  PATIENT:  Melanie [name]  36 y.o. female  PRE-OPERATIVE DIAGNOSIS:  cholelithiasis  POST-OPERATIVE DIAGNOSIS:  cholelithiasis  PROCEDURE:  Procedure(s) (LRB) with comments: LAPAROSCOPIC CHOLECYSTECTOMY WITH INTRAOPERATIVE CHOLANGIOGRAM (N/A)  SURGEON:  Surgeon(s) and Role:    * Lodema Pilot, DO - Primary  PHYSICIAN ASSISTANT:   ASSISTANTS: none   ANESTHESIA:   general  EBL:  Total I/O In: 1100 [I.V.:1100] Out: -   BLOOD ADMINISTERED:none  DRAINS: none   LOCAL MEDICATIONS USED:  MARCAINE    and LIDOCAINE   SPECIMEN:  Source of Specimen:  gallbladder  DISPOSITION OF SPECIMEN:  PATHOLOGY  COUNTS:  YES  TOURNIQUET:  * No tourniquets in log *  DICTATION: .Other Dictation: Dictation Number [id number]  PLAN OF CARE: Discharge to home after PACU  PATIENT DISPOSITION:  PACU - hemodynamically stable.   Delay start of Pharmacological VTE agent (>24hrs) due to surgical blood loss or risk of bleeding: no

## 2011-12-03 NOTE — H&P (View-Only) (Signed)
Patient ID: Melanie [name], female   DOB: 11-02-1975, 36 y.o.   MRN: [id number]  Chief Complaint  Patient presents with  . Cholelithiasis    HPI Melanie [name] is a 36 y.o. female.   HPI  This patient presents for evaluation of abdominal pain, nausea, and blurry vision. She says that she was feeling normal until May when she began having a loss of appetite. Following that she developed abdominal pain which she's describes as starting 2 hours after eating and is worse with eating high carbohydrate meal or greasy foods. She describes this as pain radiating to her back or originating in her right rib cage area which is only relieved with time. She also describes chest pain and blurry vision and she says that when this started at the white part of her eyes were yellow. She says that she has nausea almost always but is mainly in the morning and she has associated fevers and chills although she is not measured any actual temperatures. She denies any blood in the stools or black stools although she says that she has some occasional white stools.  She has been to the emergency room several times for evaluation and she has had several cardiac evaluations for chest pain which been negative and all of her laboratory studies and abdominal imaging has been negative as well. On file, we have a negative ultrasound and negative CT scan but she says that she went to the emergency room at [facility] yesterday and she had an ultrasound which demonstrated gallstones. We do not have access to this report during this visit. She also says that she had an EGD yesterday as well but this report is also not available. She is also seen a neurologist and apparently this workup has been negative as well. She describes intermittent blurry vision which she says is associated with her GI complaints. She also says that she had a "mini stroke" but there is no neurologic evidence of this.  Past Medical History  Diagnosis Date    . TIA (transient ischemic attack)   . Seizures   . Brain tumor 2011  . Embolism - blood clot in left ovary  . Meningioma 08/14/2011    S/p craniotomy Encephaloma present non changing on MRI in 08/2011  . Anxiety   . Gallstones   . Asthma   . Clotting disorder   . Stroke   . Chills   . Unintentional weight loss   . Visual disturbance   . Palpitations   . Abdominal pain   . Rectal bleeding   . Nausea   . Rectal pain   . Blood in urine   . Weakness   . Confusion     Past Surgical History  Procedure Date  . Brain tumor removed 2011  . Ovarian mass removed 2010    left    Family History  Problem Relation Age of Onset  . Depression Mother   . Alcohol abuse Sister   . Alcohol abuse Brother   . Alzheimer's disease Maternal Grandmother   . Diabetes Maternal Grandmother   . Stroke Maternal Grandmother   . Heart failure Father   . Hypertension Father   . Heart disease Father     Social History History  Substance Use Topics  . Smoking status: Never Smoker   . Smokeless tobacco: Not on file  . Alcohol Use: No    No Known Allergies  Current Outpatient Prescriptions  Medication Sig Dispense Refill  . cephALEXin (  KEFLEX) 500 MG capsule Take 500 mg by mouth 3 (three) times daily. For 7 days; Start date 10/24/11      . citalopram (CELEXA) 20 MG tablet Take 1 tablet (20 mg total) by mouth daily.  30 tablet  3  . LORazepam (ATIVAN) 0.5 MG tablet Take 1 tablet (0.5 mg total) by mouth at bedtime as needed. For anxiety/sleep.  15 tablet  0  . Multiple Vitamin (MULTIVITAMIN WITH MINERALS) TABS Take 1 tablet by mouth daily.      Marland Kitchen omeprazole (PRILOSEC) 20 MG capsule Take 1 capsule (20 mg total) by mouth daily.  30 capsule  0    Review of Systems Review of Systems All other review of systems negative or noncontributory except as stated in the HPI  Blood pressure 122/68, pulse 66, temperature 98.9 F (37.2 C), temperature source Temporal, resp. rate 14, height 5\' 1"  (1.549 m),  weight 137 lb 2 oz (62.199 kg), last menstrual period 10/04/2011.  Physical Exam Physical Exam Physical Exam  Nursing note and vitals reviewed. Constitutional: She is oriented to person, place, and time. She appears well-developed and well-nourished. No distress.  HENT:  Head: Normocephalic and atraumatic.  Mouth/Throat: No oropharyngeal exudate.  Eyes: Conjunctivae and EOM are normal. Pupils are equal, round, and reactive to light. Right eye exhibits no discharge. Left eye exhibits no discharge. No scleral icterus.  Neck: Normal range of motion. Neck supple. No tracheal deviation present.  Cardiovascular: Normal rate, regular rhythm, normal heart sounds and intact distal pulses.   Pulmonary/Chest: Effort normal and breath sounds normal. No stridor. No respiratory distress. She has no wheezes.  Abdominal: Soft. Bowel sounds are normal. She exhibits no distension and no mass. There is no tenderness. There is no rebound and no guarding.  Musculoskeletal: Normal range of motion. She exhibits no edema and no tenderness.  Neurological: She is alert and oriented to person, place, and time.  Skin: Skin is warm and dry. No rash noted. She is not diaphoretic. No erythema. No pallor.  Psychiatric: She has a normal mood and affect. Her behavior is normal. Judgment and thought content normal.    Data Reviewed Primary care notes, [place],   Assessment    Abdominal pain and nausea At this time, I'm not sure what is causing her symptoms. She has several symptoms which she complains of today and the problem is that we have no objective evidence as to cause. She came in today for evaluation for possible cholecystectomy,  However, we have no proof that this is from her gallbladder. We are trying to retrieve her ultrasound and EGD reports from [facility]. If she has gallstones, then it might be reasonable to proceed with cholecystectomy as a possible cure for her symptoms. However, much of the discussion  today with counseling regarding the possibility for persistent symptoms if we do proceed with cholecystectomy. We also discussed other risks of cholecystectomy. The risks of infection, bleeding, pain, persistent symptoms, scarring, injury to bowel or bile ducts, retained stone, diarrhea, need for additional procedures, and need for open surgery discussed with the patient. I recommended that she call me back next week to see if we have received the results of her studies so we can further discussed were to go from here. I also explained that her other symptoms such as blurry vision and or strokes are not related to her gallbladder. I recommended that she see an ophthalmologist and followup with her primary care physician for these other complaints. I actually spoke  with her primary care physician today Dr. Madolyn Frieze about her care.     Plan    Obtain outside records and then we can discuss surgery later if gallstones are present.       Lodema Pilot DAVID 11/09/2011, 1:24 PM

## 2011-12-03 NOTE — Anesthesia Postprocedure Evaluation (Signed)
  Anesthesia Post-op Note  Patient: Melanie [name]  Procedure(s) Performed: Procedure(s) (LRB) with comments: LAPAROSCOPIC CHOLECYSTECTOMY WITH INTRAOPERATIVE CHOLANGIOGRAM (N/A)  Patient Location: PACU  Anesthesia Type: General  Level of Consciousness: awake, alert  and oriented  Airway and Oxygen Therapy: Patient Spontanous Breathing  Post-op Pain: mild  Post-op Assessment: Post-op Vital signs reviewed and Patient's Cardiovascular Status Stable  Post-op Vital Signs: stable  Complications: No apparent anesthesia complications

## 2011-12-03 NOTE — Preoperative (Signed)
Beta Blockers   Reason not to administer Beta Blockers:Not Applicable 

## 2011-12-03 NOTE — Anesthesia Preprocedure Evaluation (Signed)
Anesthesia Evaluation  Patient identified by MRN, date of birth, ID band Patient awake    Reviewed: Allergy & Precautions, H&P , NPO status , Patient's Chart, lab work & pertinent test results  Airway Mallampati: II      Dental  (+) Teeth Intact and Dental Advisory Given   Pulmonary  breath sounds clear to auscultation        Cardiovascular Rhythm:Regular Rate:Normal     Neuro/Psych    GI/Hepatic   Endo/Other    Renal/GU      Musculoskeletal   Abdominal   Peds  Hematology   Anesthesia Other Findings   Reproductive/Obstetrics                           Anesthesia Physical Anesthesia Plan  ASA: II  Anesthesia Plan: General   Post-op Pain Management:    Induction: Intravenous  Airway Management Planned: Oral ETT  Additional Equipment:   Intra-op Plan:   Post-operative Plan:   Informed Consent: I have reviewed the patients History and Physical, chart, labs and discussed the procedure including the risks, benefits and alternatives for the proposed anesthesia with the patient or authorized representative who has indicated his/her understanding and acceptance.   Dental advisory given  Plan Discussed with: CRNA and Surgeon  Anesthesia Plan Comments:         Anesthesia Quick Evaluation

## 2011-12-03 NOTE — Op Note (Signed)
Melanie [name], Melanie [name] NO.:  [id number]  MEDICAL RECORD NO.:  [id number]  LOCATION:  [facility]                         FACILITY:  [facility]  PHYSICIAN:  Lodema Pilot, MD       DATE OF BIRTH:  09-Mar-1976  DATE OF PROCEDURE:  12/03/2011 DATE OF DISCHARGE:                              OPERATIVE REPORT   PREOPERATIVE DIAGNOSIS:  Cholelithiasis.  POSTOPERATIVE DIAGNOSIS:  Cholelithiasis.  PROCEDURE:  Laparoscopic cholecystectomy with intraoperative cholangiogram.  SURGEON:  Lodema Pilot, MD  ASSISTANT:  None.  ANESTHESIA:  General endotracheal anesthesia and 27 mL of 1% lidocaine with epinephrine and 0.25% Marcaine in a 50:50 mixture.  FLUIDS:  1100 mL of crystalloid.  ESTIMATED BLOOD LOSS:  Minimal.  DRAINS:  None.  SPECIMENS:  Gallbladder and contents sent to Pathology for permanent sectioning.  COMPLICATIONS:  None apparent.  FINDINGS:  Normal-appearing gallbladder anatomy.  No evidence of inflammatory changes.  Questionable choledocholithiasis with free- floating stones versus air bubbles almost completely evacuated after the third cholangiogram.  Otherwise normal-appearing liver, stomach and intestines.  She did have questionable endometriosis in the right lower quadrant on the anterior abdominal wall.  INDICATION FOR PROCEDURE:  Melanie [name] is a 36 year old female with complaints of failure to thrive and inability to eat with constant nausea and abdominal pain since May.  She has had several ultrasounds and recently was found to have a few small gallstones on ultrasound. Otherwise, all of her other laboratory studies and endoscopies and imaging has been unremarkable.  After much discussion, we decided to go ahead and offer her cholecystectomy for possible relief of her symptoms, although explained her main risk was persistence of her symptoms postoperatively.  OPERATIVE DETAILS:  Melanie [name] was seen and evaluated in the preoperative area and  risks and benefits of the procedure were again discussed in lay terms.  Informed consent was obtained.  She was given prophylactic antibiotics and taken to the operating room, placed on the table in supine position.  General endotracheal tube anesthesia was obtained and her abdomen was prepped and draped in a standard surgical fashion.  Procedure time-out was performed with all operative team members to confirm proper patient, procedure, and supraumbilical midline incision was made in the skin and dissection carried down to the subcutaneous tissue using blunt dissection.  Abdominal wall fascia was elevated and sharply incised and the peritoneum entered bluntly.  An 0 Vicryl sutures were placed through the fascia and a 12-mm balloon port was placed under direct visualization.  Pneumoperitoneum was obtained and laparoscope was introduced.  There was no evidence of bowel injury upon entry.  She had a very nice anatomy.  She was very thin.  Her liver appeared normal, clean, sharp edges and no masses.  She had what appeared to be a possible endometrial implant in the right lower quadrant of the abdominal wall, but I did not see any other signs of endometriosis.  Two 5 mm right upper quadrant trocars were placed under direct visualization and an 11 mm epigastric trocar was placed under direct visualization.  The gallbladder was retracted cephalad and she had a very clear anatomy again to see the common bile duct, she was very thin.  The cystic artery was seen coursing onto the gallbladder medial aspect in its usual anatomic position.  Peritoneum was taken down bluntly and the cystic artery was skeletonized and clipped, but not divided at this time.  Cystic duct was skeletonized as well, very small cystic duct and a critical view of safety was obtained visualizing a single cystic duct and single cystic artery coursing onto the gallbladder and liver parenchyma through the triangle of CLO.  Clip  was placed on the gallbladder side of the cystic duct and a small cystic ductotomy was made and a cholangiogram catheter passed to the abdominal wall and a cholangiogram was performed.  Originally, this demonstrated several small mobile filling defects and then changed the contrast used saline and flushed with another 20 mL of saline through the bile duct and repeated the cholangiogram.  This showed improvement with less filling defects.  There was still some questionable filling defects in the distal common bile duct and a third cholangiogram was performed with the contrast and found there was only questionable sludge versus debris versus air bubble in the distal common bile duct, but there was a good jet of contrast flowing into the duodenum and normal right and left hepatic ducts.  Otherwise, no filling defects and questionable whether this was truly a filling defect.  I removed the cholangiogram catheter and put 2 clips on the stay side of the cystic duct and the duct was transected and the previously clipped artery was also transected and the gallbladder was removed from the gallbladder fossa using Bovie electrocautery and the gallbladder was entered at one point high on the gallbladder with spillage of a small amount of bile, but no spillage of any gallstones.  The gallbladder was completely removed and placed in an EndoCatch bag.  The gallbladder was removed from the umbilical trocar site and I did not appreciate any gallstones in the gallbladder with palpation.  This was sent to Pathology for permanent section.  The gallbladder fossa was inspected for hemostasis which was noted to be adequate.  Clips appeared to be in good position.  Right upper quadrant was irrigated with 2 L of sterile saline solution.  The irrigation returned clear.  Again, there was no evidence of bleeding or bile or bowel injury.  The fluid was suctioned from the remainder of the abdomen.  Again, there was no  other abnormalities identified other than possible endometriosis in the right lower quadrant.  The right upper quadrant trocars were removed under direct visualization and abdominal wall was noted to be hemostatic.  The umbilical sutures were approximated and the fascia appeared well approximated and the abdomen was then re-insufflated with carbon dioxide gas and the umbilical closure was noted to be adequate without any evidence of bowel injury. Right upper quadrant again appeared to be hemostatic without any evidence of bleeding, bile or bowel injury.  Final trocar was removed and the incisions were injected with 1% lidocaine with epinephrine, 0.25% Marcaine in a 50:50 mixture and the skin edges were approximated with 4-0 Monocryl subcuticular suture.  Skin was washed and dried and Dermabond was applied.  All sponge, needle, and instrument counts were correct at the end of the case.  The patient tolerated the procedure well without apparent complication.  After the case, I spoke with Jeronimo Greaves, one of the radiologist, and reviewed the cholangiogram pictures and he agreed that this was questionable distal common bile duct filling defect with possible sludge or air bubbles, but otherwise cholangiogram appeared normal  and the plan was for discharge home if she continues to feel well and recovered well from her surgery.  I will see her back this week to repeat LFTs and if her LFTs are abnormal then we will check the MRCP.          ______________________________ Lodema Pilot, MD     BL/MEDQ  D:  12/03/2011  T:  12/03/2011  Job:  [id number]

## 2011-12-04 ENCOUNTER — Telehealth (INDEPENDENT_AMBULATORY_CARE_PROVIDER_SITE_OTHER): Payer: Self-pay

## 2011-12-04 ENCOUNTER — Ambulatory Visit (HOSPITAL_COMMUNITY): Payer: Medicare Other

## 2011-12-04 ENCOUNTER — Encounter (INDEPENDENT_AMBULATORY_CARE_PROVIDER_SITE_OTHER): Payer: Self-pay

## 2011-12-04 ENCOUNTER — Encounter (HOSPITAL_COMMUNITY): Payer: Self-pay | Admitting: General Surgery

## 2011-12-04 LAB — COMPREHENSIVE METABOLIC PANEL
ALT: 12 U/L (ref 0–35)
AST: 21 U/L (ref 0–37)
CO2: 23 mEq/L (ref 19–32)
Chloride: 107 mEq/L (ref 96–112)
Creatinine, Ser: 0.53 mg/dL (ref 0.50–1.10)
GFR calc non Af Amer: >90 mL/min (ref >90)
Total Bilirubin: 0.2 mg/dL — ABNORMAL LOW (ref 0.3–1.2)

## 2011-12-04 LAB — GLUCOSE, CAPILLARY: Glucose-Capillary: 102 mg/dL — ABNORMAL HIGH (ref 70–99)

## 2011-12-04 LAB — CBC
MCH: 29 pg (ref 26.0–34.0)
MCHC: 35.3 g/dL (ref 30.0–36.0)
Platelets: 223 10*3/uL (ref 150–400)
RBC: 4.49 MIL/uL (ref 3.87–5.11)
RDW: 13.6 % (ref 11.5–15.5)

## 2011-12-04 MED ORDER — GADOBENATE DIMEGLUMINE 529 MG/ML IV SOLN
13.0000 mL | Freq: Once | INTRAVENOUS | Status: AC
  Administered 2011-12-04: 13 mL via INTRAVENOUS

## 2011-12-04 NOTE — Progress Notes (Signed)
1 Day Post-Op  Subjective: Nausea improving but has been NPO for MRCP which is still pending.  She says that pain is minimal and has not had any pain meds.  She has been refusing nausea medication.  Objective: Vital signs in last 24 hours: Temp:  [97.3 F (36.3 C)-99.2 F (37.3 C)] 98.7 F (37.1 C) (09/24 1330) Pulse Rate:  [55-98] 63  (09/24 1330) Resp:  [14-20] 18  (09/24 1330) BP: (99-137)/(54-82) 122/69 mmHg (09/24 1330) SpO2:  [97 %-99 %] 98 % (09/24 1330) Weight:  [136 lb 0.4 oz (61.7 kg)] 136 lb 0.4 oz (61.7 kg) (09/24 0138) Last BM Date: 12/03/11  Intake/Output from previous day: 09/23 0701 - 09/24 0700 In: 2962.5 [I.V.:2962.5] Out: 700 [Urine:300; Emesis/NG output:350] Intake/Output this shift:    General appearance: alert, cooperative and no distress Resp: nonlabored Cardio: normal rate, regular GI: soft, minimal incisional tenderness, ND, wounds without infection, no peritoneal signs  Lab Results:   Basename 12/04/11 0510  WBC 12.5*  HGB 13.0  HCT 36.8  PLT 223   BMET  Basename 12/04/11 0510  NA 141  K 3.6  CL 107  CO2 23  GLUCOSE 125*  BUN <3*  CREATININE 0.53  CALCIUM 9.2   PT/INR No results found for this basename: LABPROT:2,INR:2 in the last 72 hours ABG No results found for this basename: PHART:2,PCO2:2,PO2:2,HCO3:2 in the last 72 hours  Studies/Results: Dg Cholangiogram Operative  12/03/2011  *RADIOLOGY REPORT*  Clinical Data:   Laparoscopic cholecystectomy.  INTRAOPERATIVE CHOLANGIOGRAM  Technique:  Cholangiographic images from the C-arm fluoroscopic device were submitted for interpretation post-operatively.  Please see the procedural report for the amount of contrast and the fluoroscopy time utilized.  Comparison:  Ultrasound of 11/20/2011.  Findings:  3 runs from an intraoperative procedure are submitted. On initial run, of 67 images, there are numerous filling defects suspicious for common duct stones.  There is no contrast extravasation.   No significant biliary ductal dilatation.  The second and third runs demonstrate marked improvement in appearance of the common duct.  The second run has 96 images and third run has 80 images.  Cannot exclude a tiny residual filling defect in the distal common duct.  IMPRESSION:  1.  Equivocal tiny filling defect in the distal common duct on the final images.  Cannot exclude choledocholithiasis. 2.  No biliary ductal dilatation or free contrast extravasation.  Findings discussed with Dr. Biagio Quint prior to this dictation.   Original Report Authenticated By: Consuello Bossier, M.D.     Anti-infectives: Anti-infectives     Start     Dose/Rate Route Frequency Ordered Stop   12/02/11 1454   ceFAZolin (ANCEF) IVPB 2 g/50 mL premix        2 g 100 mL/hr over 30 Minutes Intravenous 60 min pre-op 12/02/11 1454 12/03/11 0738          Assessment/Plan: s/p Procedure(s) (LRB) with comments: LAPAROSCOPIC CHOLECYSTECTOMY WITH INTRAOPERATIVE CHOLANGIOGRAM (N/A) NPO for mrcp given abdnormal cholangiogram.  LFT's normal.  Nausea improved to baseline.  I discussed with her the options for MRCP vs. Discharge and follow labs and clinically but she is insistent on mrcp.  If MRCP negative, will advance diet and discharge when tolerating diet.  If positive for stone, will ask GI to evaluate.  LOS: 1 day    Lodema Pilot DAVID 12/04/2011

## 2011-12-04 NOTE — Telephone Encounter (Signed)
Left message for patient to contact our office re: patient need's LFT's per Dr. Biagio Quint, order placed in Epic, patient need's to have lab's checked Wed 12/05/11.

## 2011-12-04 NOTE — Telephone Encounter (Signed)
LMOM to call and confirm po appt.

## 2011-12-04 NOTE — Progress Notes (Signed)
INITIAL ADULT NUTRITION ASSESSMENT Date: 12/04/2011   Time: 2:29 PM Reason for Assessment: MST  INTERVENTION: 1.  Modify diet; resume of PO diet once medically appropriate.  Advancements per MD discretion to Low Fat goal based on pt tolerance.   DOCUMENTATION CODES Per approved criteria  -Severe malnutrition in the context of chronic illness    ASSESSMENT: Female 36 y.o.  Dx: cholelithiasis   Hx:  Past Medical History  Diagnosis Date  . TIA (transient ischemic attack)   . Seizures   . Brain tumor 2011  . Embolism - blood clot in left ovary  . Meningioma 08/14/2011    S/p craniotomy Encephaloma present non changing on MRI in 08/2011  . Anxiety   . Gallstones   . Asthma   . Clotting disorder   . Stroke   . Chills   . Unintentional weight loss   . Visual disturbance   . Palpitations   . Abdominal pain   . Rectal bleeding   . Nausea   . Rectal pain   . Blood in urine   . Weakness   . Confusion    Past Surgical History  Procedure Date  . Brain tumor removed 2011  . Ovarian mass removed 2010    left  . Cholecystectomy 12/03/2011    Procedure: LAPAROSCOPIC CHOLECYSTECTOMY WITH INTRAOPERATIVE CHOLANGIOGRAM;  Surgeon: Lodema Pilot, DO;  Location: [facility] OR;  Service: General;  Laterality: N/A;    Related Meds:  Scheduled Meds:   . acetaminophen      . dexamethasone      . enoxaparin (LOVENOX) injection  40 mg Subcutaneous Q24H  . HYDROmorphone      . LORazepam  0.25 mg Oral Q24H  . LORazepam  0.25 mg Oral NOW  . ondansetron      . DISCONTD: droperidol  0.625 mg Intravenous Once  . DISCONTD: LORazepam  0.25 mg Oral QHS   Continuous Infusions:   . dextrose 5 % and 0.45 % NaCl with KCl 20 mEq/L 125 mL/hr (12/04/11 0513)  . dextrose 5 % and 0.9% NaCl 125 mL/hr at 12/03/11 1315   PRN Meds:.HYDROcodone-acetaminophen, HYDROmorphone (DILAUDID) injection, ondansetron (ZOFRAN) IV, ondansetron, promethazine, DISCONTD:  HYDROmorphone (DILAUDID) injection   Ht: 5\' 1"   (154.9 cm)  Wt: 136 lb 0.4 oz (61.7 kg)  Ideal Wt: 105 lbs % Ideal Wt: 129%  Usual Wt: 150 lbs % Usual Wt: 90.6%  Body mass index is 25.70 kg/(m^2).  Food/Nutrition Related Hx: decreased appetite with wt loss PTA  Labs:  CMP     Component Value Date/Time   NA 141 12/04/2011 0510   K 3.6 12/04/2011 0510   CL 107 12/04/2011 0510   CO2 23 12/04/2011 0510   GLUCOSE 125* 12/04/2011 0510   BUN <3* 12/04/2011 0510   CREATININE 0.53 12/04/2011 0510   CALCIUM 9.2 12/04/2011 0510   PROT 6.3 12/04/2011 0510   ALBUMIN 3.4* 12/04/2011 0510   AST 21 12/04/2011 0510   ALT 12 12/04/2011 0510   ALKPHOS 44 12/04/2011 0510   BILITOT 0.2* 12/04/2011 0510   GFRNONAA >90 12/04/2011 0510   GFRAA >90 12/04/2011 0510    CBC    Component Value Date/Time   WBC 12.5* 12/04/2011 0510   RBC 4.49 12/04/2011 0510   HGB 13.0 12/04/2011 0510   HCT 36.8 12/04/2011 0510   PLT 223 12/04/2011 0510   MCV 82.0 12/04/2011 0510   MCH 29.0 12/04/2011 0510   MCHC 35.3 12/04/2011 0510   RDW 13.6 12/04/2011 0510  LYMPHSABS 1.5 11/26/2011 1557   MONOABS 0.4 11/26/2011 1557   EOSABS 0.1 11/26/2011 1557   BASOSABS 0.0 11/26/2011 1557    Intake: NPO Output:   Intake/Output Summary (Last 24 hours) at 12/04/11 1431 Last data filed at 12/04/11 1331  Gross per 24 hour  Intake 1512.5 ml  Output    475 ml  Net 1037.5 ml   No BM since admission + Emesis  Diet Order: NPO  Supplements/Tube Feeding: none at this time  IVF:    dextrose 5 % and 0.45 % NaCl with KCl 20 mEq/L Last Rate: 125 mL/hr (12/04/11 0513)  dextrose 5 % and 0.9% NaCl Last Rate: 125 mL/hr at 12/03/11 1315    Estimated Nutritional Needs:   Kcal: 1700-1950 Protein: 73-85g Fluid: >1.8 L/day  Pt reports severe wt loss of 9.3% in 3-4 months starting in May.  Pt states she weighed herself before coming to the hospital and was almost down to 120 lbs.  Weight on admission wt 136 lbs. Pt s/p cholecystectomy, awaiting MRCP this afternoon. Op note reviewed, noted  questionable endometriosis.  Pt states she has been unable to eat or drink much PTA.  She states she tolerates warm liquids better than cold liquids.  She reports extreme fatigue (unable to stay awake or open eyes) when eating large meals, but no pain.  She orginally thought she may be lactose intolerant, however continued to notice these 'brain fog' moments when eating most anything in a large portion- spaghetti, fried foods, etc..  Pt qualifies for severe malnutrition of chronic illness based on wt loss of 9.3% in 3-4 months and pt's report of consuming minimal intake for >1 month.  RD introduced name and role.  Shared joint desire to improve comfort while eating and pt meeting needs to prevent further wt loss.  Pt states her desire to understand her 'GI issues' and prevent further wt loss as well.  NUTRITION DIAGNOSIS: -Inadequate oral intake (NI-2.1).  Status: Ongoing  RELATED TO: omission of energy dense foods  AS EVIDENCE BY: pt NPO, s/p cholecystectomy  MONITORING/EVALUATION(Goals): 1.  Food/Beverage; diet advancement with tolerance. 2.  Wt/wt change; monitor trends  EDUCATION NEEDS: -Education needs addressed   Loyce Dys, MS RD LDN Clinical Inpatient Dietitian Pager: [phone] Weekend/After hours pager: [phone]

## 2011-12-04 NOTE — Progress Notes (Signed)
Pt was very upset stated that she has been NPO all day for a MRCP and that nobody has informed her what was happening she c/o of gas pain and refused pain medication stated that she felt very weak and dizzy Cbg done to check for hypoglycemia result 102. Pt did ambulate with NT for short distance was given schedule ativan 0.25 mg will continue to monitor. Ilean Skill LPN

## 2011-12-05 LAB — COMPREHENSIVE METABOLIC PANEL
AST: 27 U/L (ref 0–37)
Albumin: 3.7 g/dL (ref 3.5–5.2)
Calcium: 9.8 mg/dL (ref 8.4–10.5)
Creatinine, Ser: 0.56 mg/dL (ref 0.50–1.10)
Total Protein: 7.3 g/dL (ref 6.0–8.3)

## 2011-12-05 LAB — CBC WITH DIFFERENTIAL/PLATELET
Eosinophils Absolute: 0.1 10*3/uL (ref 0.0–0.7)
Eosinophils Relative: 2 % (ref 0–5)
Hemoglobin: 14.4 g/dL (ref 12.0–15.0)
Lymphocytes Relative: 30 % (ref 12–46)
Lymphs Abs: 1.8 10*3/uL (ref 0.7–4.0)
MCH: 29 pg (ref 26.0–34.0)
MCV: 84.1 fL (ref 78.0–100.0)
Monocytes Relative: 9 % (ref 3–12)
RBC: 4.97 MIL/uL (ref 3.87–5.11)

## 2011-12-05 MED ORDER — HYDROCODONE-ACETAMINOPHEN 5-325 MG PO TABS: 1.0000 | ORAL_TABLET | ORAL | Status: DC | PRN

## 2011-12-05 NOTE — Progress Notes (Signed)
Pt discharged to home. Discharge instructions explained pt verbalized understanding  

## 2011-12-05 NOTE — Progress Notes (Signed)
2 Days Post-Op  Subjective: Feels better. Tolerating diet.  MRCP negative  Objective: Vital signs in last 24 hours: Temp:  [97.7 F (36.5 C)-98.6 F (37 C)] 98.6 F (37 C) (09/25 1416) Pulse Rate:  [64-73] 67  (09/25 1416) Resp:  [18-20] 20  (09/25 1416) BP: (99-117)/(57-89) 114/74 mmHg (09/25 1416) SpO2:  [96 %-99 %] 98 % (09/25 1416) Last BM Date: 12/03/11  Intake/Output from previous day: 09/24 0701 - 09/25 0700 In: 3375 [I.V.:3375] Out: -  Intake/Output this shift: Total I/O In: 480 [P.O.:480] Out: 150 [Urine:150]  General appearance: alert, cooperative and no distress Resp: clear to auscultation bilaterally Cardio: normal rate, regular GI: soft, minimal tenderness, ND, no peritoneal signs, wounds without infection  Lab Results:   Basename 12/05/11 1303 12/04/11 0510  WBC 6.0 12.5*  HGB 14.4 13.0  HCT 41.8 36.8  PLT 235 223   BMET  Basename 12/04/11 0510  NA 141  K 3.6  CL 107  CO2 23  GLUCOSE 125*  BUN <3*  CREATININE 0.53  CALCIUM 9.2   PT/INR No results found for this basename: LABPROT:2,INR:2 in the last 72 hours ABG No results found for this basename: PHART:2,PCO2:2,PO2:2,HCO3:2 in the last 72 hours  Studies/Results: Mr 3d Recon At Scanner  12/05/2011  *RADIOLOGY REPORT*  Clinical Data:  Possible choledocholithiasis on cholangiogram.  MRI ABDOMEN WITHOUT AND WITH CONTRAST (MRCP)  Technique:  Multiplanar multisequence MR imaging of the abdomen was performed without and with contrast, including heavily T2-weighted images of the biliary and pancreatic ducts.  Three-dimensional MR images were rendered by post processing of the original MR data.  Contrast: 13mL MULTIHANCE GADOBENATE DIMEGLUMINE 529 MG/ML IV SOLN  Comparison:  Intraoperative cholangiogram 12/03/2011.  Findings:  MRCP demonstrates no intrahepatic ductal dilatation, no common bile duct dilatation, and no pancreatic duct dilatation. The gallbladder has been removed, and there is a trace amount  of ascites and postoperative fluid in the gallbladder fossa and tracking along the right hepatic lobe.  MRCP images demonstrate no definite filling defects in the common bile duct to suggest the presence of choledocholelithiasis at this time.  Other incidental findings include a subcentimeter lesion in the posterior aspect of the upper pole of the left kidney which is hyperintense on T2-weighted images, hypointense on T1-weighted images, and does not enhance, compatible with a tiny simple cyst. A similar appearing 2 cm lesion in the left renal pelvis region is compatible with a peripelvic cyst.  No focal hepatic lesions are noted.  Visualized portions of the pancreas, spleen, bilateral adrenal glands and right kidney are unremarkable.  There is very slight loss of signal intensity throughout the hepatic parenchyma on the out of phase dual echo images, suggesting very mild hepatic steatosis.  IMPRESSION: 1.  No evidence of choledocholelithiasis or biliary tract obstruction. 2.  Status post cholecystectomy with a trace amount of postoperative fluid in the gallbladder fossa and tracking down along the margin of the right lobe of the liver.  These findings are expected in the immediate postoperative state. 3.  Mild hepatic steatosis. 4.  Small left renal peripelvic cyst and tiny upper pole left renal cyst, as above.   Original Report Authenticated By: Florencia Reasons, M.D.    Mr Melanie [name]  12/05/2011  *RADIOLOGY REPORT*  Clinical Data:  Possible choledocholithiasis on cholangiogram.  MRI ABDOMEN WITHOUT AND WITH CONTRAST (MRCP)  Technique:  Multiplanar multisequence MR imaging of the abdomen was performed without and with contrast, including heavily T2-weighted images of the  biliary and pancreatic ducts.  Three-dimensional MR images were rendered by post processing of the original MR data.  Contrast: 13mL MULTIHANCE GADOBENATE DIMEGLUMINE 529 MG/ML IV SOLN  Comparison:  Intraoperative cholangiogram  12/03/2011.  Findings:  MRCP demonstrates no intrahepatic ductal dilatation, no common bile duct dilatation, and no pancreatic duct dilatation. The gallbladder has been removed, and there is a trace amount of ascites and postoperative fluid in the gallbladder fossa and tracking along the right hepatic lobe.  MRCP images demonstrate no definite filling defects in the common bile duct to suggest the presence of choledocholelithiasis at this time.  Other incidental findings include a subcentimeter lesion in the posterior aspect of the upper pole of the left kidney which is hyperintense on T2-weighted images, hypointense on T1-weighted images, and does not enhance, compatible with a tiny simple cyst. A similar appearing 2 cm lesion in the left renal pelvis region is compatible with a peripelvic cyst.  No focal hepatic lesions are noted.  Visualized portions of the pancreas, spleen, bilateral adrenal glands and right kidney are unremarkable.  There is very slight loss of signal intensity throughout the hepatic parenchyma on the out of phase dual echo images, suggesting very mild hepatic steatosis.  IMPRESSION: 1.  No evidence of choledocholelithiasis or biliary tract obstruction. 2.  Status post cholecystectomy with a trace amount of postoperative fluid in the gallbladder fossa and tracking down along the margin of the right lobe of the liver.  These findings are expected in the immediate postoperative state. 3.  Mild hepatic steatosis. 4.  Small left renal peripelvic cyst and tiny upper pole left renal cyst, as above.   Original Report Authenticated By: Florencia Reasons, M.D.     Anti-infectives: Anti-infectives     Start     Dose/Rate Route Frequency Ordered Stop   12/02/11 1454   ceFAZolin (ANCEF) IVPB 2 g/50 mL premix        2 g 100 mL/hr over 30 Minutes Intravenous 60 min pre-op 12/02/11 1454 12/03/11 0738          Assessment/Plan: s/p Procedure(s) (LRB) with comments: LAPAROSCOPIC CHOLECYSTECTOMY  WITH INTRAOPERATIVE CHOLANGIOGRAM (N/A) doing better.  MRCP negative.  will plan to discharge to home if LFT"s continue to be normal.  LOS: 2 days    Lodema Pilot DAVID 12/05/2011

## 2011-12-12 ENCOUNTER — Telehealth: Payer: Self-pay | Admitting: Family Medicine

## 2011-12-12 NOTE — Telephone Encounter (Signed)
Pt is having problem with meds - it is making her feel really wierd

## 2011-12-12 NOTE — Telephone Encounter (Signed)
Message left to return call.

## 2011-12-12 NOTE — Telephone Encounter (Signed)
Spoke with patient . States she thinks the medication that is causing problems is Lorazepam. Has been on it for  4 months and since starting has made her feel lightheaded, dizzy. Recently she has tried to wean off. For 12 days has cut tab in half,  now for 4.5 days has cut it into fourths. States symptoms have worsened in past month.  . Head feels weird. Feels like she can't hold her eyes open and like she will go into a coma. . Has an already scheduled appointment with Dr. Louanne Belton on 10/04. Will discuss at that visit. Offered appointment tomorrow if she wants but she will wait until Friday.

## 2011-12-13 ENCOUNTER — Emergency Department (HOSPITAL_COMMUNITY)
Admission: EM | Admit: 2011-12-13 | Discharge: 2011-12-13 | Disposition: A | Discharge status: Home / Self Care | Payer: Medicare Other | Attending: Emergency Medicine | Admitting: Emergency Medicine

## 2011-12-13 ENCOUNTER — Emergency Department (HOSPITAL_COMMUNITY): Payer: Medicare Other

## 2011-12-13 ENCOUNTER — Encounter (HOSPITAL_COMMUNITY): Payer: Self-pay | Admitting: Emergency Medicine

## 2011-12-13 DIAGNOSIS — F411 Generalized anxiety disorder: Secondary | ICD-10-CM | POA: Insufficient documentation

## 2011-12-13 DIAGNOSIS — J45909 Unspecified asthma, uncomplicated: Secondary | ICD-10-CM | POA: Insufficient documentation

## 2011-12-13 DIAGNOSIS — Z8673 Personal history of transient ischemic attack (TIA), and cerebral infarction without residual deficits: Secondary | ICD-10-CM | POA: Insufficient documentation

## 2011-12-13 DIAGNOSIS — H538 Other visual disturbances: Secondary | ICD-10-CM | POA: Insufficient documentation

## 2011-12-13 DIAGNOSIS — R42 Dizziness and giddiness: Secondary | ICD-10-CM | POA: Insufficient documentation

## 2011-12-13 DIAGNOSIS — Z79899 Other long term (current) drug therapy: Secondary | ICD-10-CM | POA: Insufficient documentation

## 2011-12-13 DIAGNOSIS — R109 Unspecified abdominal pain: Secondary | ICD-10-CM

## 2011-12-13 DIAGNOSIS — R1012 Left upper quadrant pain: Secondary | ICD-10-CM | POA: Insufficient documentation

## 2011-12-13 LAB — CBC WITH DIFFERENTIAL/PLATELET
Eosinophils Absolute: 0.2 10*3/uL (ref 0.0–0.7)
Eosinophils Relative: 4 % (ref 0–5)
HCT: 40.3 % (ref 36.0–46.0)
Lymphs Abs: 1.4 10*3/uL (ref 0.7–4.0)
MCH: 28.6 pg (ref 26.0–34.0)
MCV: 83.4 fL (ref 78.0–100.0)
Monocytes Absolute: 0.3 10*3/uL (ref 0.1–1.0)
Platelets: 227 10*3/uL (ref 150–400)
RBC: 4.83 MIL/uL (ref 3.87–5.11)
RDW: 13.7 % (ref 11.5–15.5)

## 2011-12-13 LAB — URINALYSIS, ROUTINE W REFLEX MICROSCOPIC
Hgb urine dipstick: NEGATIVE
Nitrite: NEGATIVE
Specific Gravity, Urine: 1.019 (ref 1.005–1.030)
Urobilinogen, UA: 0.2 mg/dL (ref 0.0–1.0)
pH: 7 (ref 5.0–8.0)

## 2011-12-13 LAB — COMPREHENSIVE METABOLIC PANEL
ALT: 13 U/L (ref 0–35)
BUN: 7 mg/dL (ref 6–23)
CO2: 26 mEq/L (ref 19–32)
Calcium: 9.2 mg/dL (ref 8.4–10.5)
Creatinine, Ser: 0.61 mg/dL (ref 0.50–1.10)
GFR calc Af Amer: >90 mL/min (ref >90)
GFR calc non Af Amer: >90 mL/min (ref >90)
Glucose, Bld: 95 mg/dL (ref 70–99)
Sodium: 136 mEq/L (ref 135–145)
Total Protein: 7.2 g/dL (ref 6.0–8.3)

## 2011-12-13 LAB — LIPASE, BLOOD: Lipase: 53 U/L (ref 11–59)

## 2011-12-13 LAB — URINE MICROSCOPIC-ADD ON

## 2011-12-13 LAB — PREGNANCY, URINE: Preg Test, Ur: NEGATIVE

## 2011-12-13 NOTE — ED Notes (Signed)
Has had blurry vision since may-- had gallbladder out last week, states "they found a black spot on my ovary " has been seen here for same in past.

## 2011-12-13 NOTE — ED Notes (Signed)
Pt states that she has been dizzy and having some blurred vision since May 2013. Pt reports that she saw a neurologist and was told they thought it was heart related and wanted her to wear a heart monitor but pt said she doesn't think it is heart related so didn't go to heart doctor. 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  Pt states that she has the same symptoms she had before she got her gallbladder removed last month ie abdominal pain. Pt states that she has an apt with OB/GYN on  Monday to discuss the "black spot" that was found, that the doctors think is endometriosis.

## 2011-12-13 NOTE — ED Provider Notes (Signed)
History     CSN: [id number]  Arrival date & time 12/13/11  4098   First MD Initiated Contact with Patient 12/13/11 [id number]      Chief Complaint  Patient presents with  . Blurred Vision  . Dizziness  . LUQ pain     (Consider location/radiation/quality/duration/timing/severity/associated sxs/prior treatment) The history is provided by the patient.   36 year old female comes in with multiple complaints the first thing she wanted to do was to see a urologist immediately. She states that she came to [facility] because she was told that that was where she could see a urologist immediately.. For the last 5 months, she has been having blurred vision and dizziness. Symptoms are worse about one hour after drinking any kind of liquid, but not worse after eating. Dizziness is a sense of going to pass out. She also relates that 3 months ago, she was in a tanning bed and suddenly had a sharp, severe perirectal pain which has persisted since then. She had a gallbladder surgery September 24 and she says that part of this was to see if it would help her dizziness and blurred vision. She had an MRI scan following the gallbladder surgery which showed a cyst in the pelvis and the cyst on her kidney. She but radiology report with her and these seem to be a simple cyst and felt to be benign. She is requesting referral to a urologist and states that she or he has an appointment with her gynecologist. She also thinks he might need to see a neurologist regarding her dizziness. She has not seen an optometrist to check her vision. She also relates that she was diagnosed with H. pylori and she is worried about that. She noticed a lymph node in the right side of her neck and is worried that it might mean that her H. pylori has returned. Last night, at 2 AM, she developed severe, sharp left upper quadrant pain which was rated at 10/10. The pain lasts a relatively short period of time and has reduced to 2/10. There is no  associated nausea or vomiting. Nothing seems to make the pain better or worse.  Past Medical History  Diagnosis Date  . TIA (transient ischemic attack)   . Seizures   . Brain tumor 2011  . Embolism - blood clot in left ovary  . Meningioma 08/14/2011    S/p craniotomy Encephaloma present non changing on MRI in 08/2011  . Anxiety   . Gallstones   . Asthma   . Clotting disorder   . Stroke   . Chills   . Unintentional weight loss   . Visual disturbance   . Palpitations   . Abdominal pain   . Rectal bleeding   . Nausea   . Rectal pain   . Blood in urine   . Weakness   . Confusion     Past Surgical History  Procedure Date  . Brain tumor removed 2011  . Ovarian mass removed 2010    left  . Cholecystectomy 12/03/2011    Procedure: LAPAROSCOPIC CHOLECYSTECTOMY WITH INTRAOPERATIVE CHOLANGIOGRAM;  Surgeon: Lodema Pilot, DO;  Location: [facility] OR;  Service: General;  Laterality: N/A;    Family History  Problem Relation Age of Onset  . Depression Mother   . Alcohol abuse Sister   . Alcohol abuse Brother   . Alzheimer's disease Maternal Grandmother   . Diabetes Maternal Grandmother   . Stroke Maternal Grandmother   . Heart failure Father   .  Hypertension Father   . Heart disease Father     History  Substance Use Topics  . Smoking status: Never Smoker   . Smokeless tobacco: Not on file  . Alcohol Use: No    OB History    Grav Para Term Preterm Abortions TAB SAB Ect Mult Living                  Review of Systems  All other systems reviewed and are negative.    Allergies  Morphine and related  Home Medications   Current Outpatient Rx  Name Route Sig Dispense Refill  . AMOXICILL-CLARITHRO-LANSOPRAZ PO MISC Oral Take 1 each by mouth 2 (two) times daily. For 14 days; Started on 11-16-11  Follow package directions.    Marland Kitchen HYDROCODONE-ACETAMINOPHEN 5-325 MG PO TABS Oral Take 1 tablet by mouth every 4 (four) hours as needed for pain. 40 tablet 0  . HYDROCODONE-ACETAMINOPHEN  5-325 MG PO TABS Oral Take 1-2 tablets by mouth every 4 (four) hours as needed. 40 tablet 0  . LORAZEPAM 0.5 MG PO TABS Oral Take 0.25 mg by mouth at bedtime as needed. For anxiety/sleep.    . ADULT MULTIVITAMIN W/MINERALS CH Oral Take 1 tablet by mouth daily.      BP 111/62  Pulse 61  Temp 97.7 F (36.5 C) (Oral)  Resp 20  SpO2 100%  LMP 11/26/2011  Physical Exam  Nursing note and vitals reviewed. 36 year old female, resting comfortably and in no acute distress. Vital signs are normal. Oxygen saturation is 100%, which is normal. Head is normocephalic and atraumatic. PERRLA, EOMI. Fundi show no hemorrhage, exudate, or papilledema. Oropharynx is clear. Neck is nontender and supple without JVD. There is a single right posterior cervical lymph node palpable and this is less than 1 cm in size and freely mobile. Back is nontender and there is no CVA tenderness. Lungs are clear without rales, wheezes, or rhonchi. Chest is nontender. Heart has regular rate and rhythm without murmur. Abdomen is soft, flat, and peristalsis is normoactive. There is diffuse tenderness which she relates to recent gallbladder surgery. She will not allow any deep palpation. She pushes my hand off of her abdomen, but it does not seem to be any actual muscular guarding. Extremities have no cyanosis or edema, full range of motion is present. Skin is warm and dry without rash. Neurologic: Mental status is normal, cranial nerves are intact, there are no motor or sensory deficits.   ED Course  Procedures (including critical care time)  Results for orders placed during the hospital encounter of 12/13/11  CBC WITH DIFFERENTIAL      Component Value Range   WBC 4.8  4.0 - 10.5 K/uL   RBC 4.83  3.87 - 5.11 MIL/uL   Hemoglobin 13.8  12.0 - 15.0 g/dL   HCT 56.2  13.0 - 86.5 %   MCV 83.4  78.0 - 100.0 fL   MCH 28.6  26.0 - 34.0 pg   MCHC 34.2  30.0 - 36.0 g/dL   RDW 78.4  69.6 - 29.5 %   Platelets 227  150 - 400 K/uL     Neutrophils Relative 58  43 - 77 %   Neutro Abs 2.8  1.7 - 7.7 K/uL   Lymphocytes Relative 30  12 - 46 %   Lymphs Abs 1.4  0.7 - 4.0 K/uL   Monocytes Relative 7  3 - 12 %   Monocytes Absolute 0.3  0.1 - 1.0 K/uL  Eosinophils Relative 4  0 - 5 %   Eosinophils Absolute 0.2  0.0 - 0.7 K/uL   Basophils Relative 1  0 - 1 %   Basophils Absolute 0.0  0.0 - 0.1 K/uL  COMPREHENSIVE METABOLIC PANEL      Component Value Range   Sodium 136  135 - 145 mEq/L   Potassium 3.8  3.5 - 5.1 mEq/L   Chloride 102  96 - 112 mEq/L   CO2 26  19 - 32 mEq/L   Glucose, Bld 95  70 - 99 mg/dL   BUN 7  6 - 23 mg/dL   Creatinine, Ser 9.56  0.50 - 1.10 mg/dL   Calcium 9.2  8.4 - 21.3 mg/dL   Total Protein 7.2  6.0 - 8.3 g/dL   Albumin 3.6  3.5 - 5.2 g/dL   AST 14  0 - 37 U/L   ALT 13  0 - 35 U/L   Alkaline Phosphatase 51  39 - 117 U/L   Total Bilirubin 0.3  0.3 - 1.2 mg/dL   GFR calc non Af Amer >90  >90 mL/min   GFR calc Af Amer >90  >90 mL/min  LIPASE, BLOOD      Component Value Range   Lipase 53  11 - 59 U/L  URINALYSIS, ROUTINE W REFLEX MICROSCOPIC      Component Value Range   Color, Urine YELLOW  YELLOW   APPearance CLOUDY (*) CLEAR   Specific Gravity, Urine 1.019  1.005 - 1.030   pH 7.0  5.0 - 8.0   Glucose, UA NEGATIVE  NEGATIVE mg/dL   Hgb urine dipstick NEGATIVE  NEGATIVE   Bilirubin Urine NEGATIVE  NEGATIVE   Ketones, ur NEGATIVE  NEGATIVE mg/dL   Protein, ur NEGATIVE  NEGATIVE mg/dL   Urobilinogen, UA 0.2  0.0 - 1.0 mg/dL   Nitrite NEGATIVE  NEGATIVE   Leukocytes, UA SMALL (*) NEGATIVE  PREGNANCY, URINE      Component Value Range   Preg Test, Ur NEGATIVE  NEGATIVE  URINE MICROSCOPIC-ADD ON      Component Value Range   Squamous Epithelial / LPF FEW (*) RARE   WBC, UA 3-6  <3 WBC/hpf   Bacteria, UA MANY (*) RARE   Urine-Other MUCOUS PRESENT     Dg Cholangiogram Operative  12/03/2011  *RADIOLOGY REPORT*  Clinical Data:   Laparoscopic cholecystectomy.  INTRAOPERATIVE CHOLANGIOGRAM   Technique:  Cholangiographic images from the C-arm fluoroscopic device were submitted for interpretation post-operatively.  Please see the procedural report for the amount of contrast and the fluoroscopy time utilized.  Comparison:  Ultrasound of 11/20/2011.  Findings:  3 runs from an intraoperative procedure are submitted. On initial run, of 67 images, there are numerous filling defects suspicious for common duct stones.  There is no contrast extravasation.  No significant biliary ductal dilatation.  The second and third runs demonstrate marked improvement in appearance of the common duct.  The second run has 96 images and third run has 80 images.  Cannot exclude a tiny residual filling defect in the distal common duct.  IMPRESSION:  1.  Equivocal tiny filling defect in the distal common duct on the final images.  Cannot exclude choledocholithiasis. 2.  No biliary ductal dilatation or free contrast extravasation.  Findings discussed with Dr. Biagio Quint prior to this dictation.   Original Report Authenticated By: Consuello Bossier, M.D.    Ct Head Wo Contrast  11/26/2011  *RADIOLOGY REPORT*  Clinical Data: Dizziness today.  History  of brain tumor removal.  CT HEAD WITHOUT CONTRAST  Technique:  Contiguous axial images were obtained from the base of the skull through the vertex without contrast.  Comparison: Most recent CT and MRI 10/21/2011  Findings: There is no evidence for acute infarction, intracranial hemorrhage, mass lesion, hydrocephalus, or extra-axial fluid. Slight left frontotemporal encephalomalacia directly underneath the craniotomy site.  No evidence for recurrent tumor or brain edema on this noncontrast examination.  No scalp hematoma or pseudomeningocele.  Clear sinuses and mastoids. Stable appearance from most recent priors.  IMPRESSION: No acute intracranial abnormality.  No postsurgical complications related to prior meningioma removal.  Stable appearance from priors.   Original Report Authenticated By:  Elsie Stain, M.D.    US Abdomen Complete  11/20/2011  *RADIOLOGY REPORT*  Clinical Data:  Nausea.  Clinical concern for cholecystitis.  COMPLETE ABDOMINAL ULTRASOUND  Comparison:  10/25/2011.  Findings:  Gallbladder:  Interval visualization of two small, mobile gallstones.  One measures 5 mm in maximum diameter and the other measures 2 mm in maximum diameter.  No gallbladder wall thickening or pericholecystic fluid.  The patient was focally tender over the gallbladder today.  Common bile duct:  Normal, measuring 3.2 mm in diameter proximally.  Liver:  Normal.  IVC:  Normal proximally.  Pancreas:  Normal  Spleen:  Normal, measuring 6.1 cm in length.  Right Kidney:  Normal, measuring 9.5 cm in length.  Left Kidney:  Normal, measuring 11.0 cm in length.  Abdominal aorta:  Normal.  IMPRESSION: Interval visualization of two small gallstones in the gallbladder. The patient is also focally tender over the gallbladder. Otherwise, normal examination.   Original Report Authenticated By: Darrol Angel, M.D.    Mr 3d Recon At Scanner  12/05/2011  *RADIOLOGY REPORT*  Clinical Data:  Possible choledocholithiasis on cholangiogram.  MRI ABDOMEN WITHOUT AND WITH CONTRAST (MRCP)  Technique:  Multiplanar multisequence MR imaging of the abdomen was performed without and with contrast, including heavily T2-weighted images of the biliary and pancreatic ducts.  Three-dimensional MR images were rendered by post processing of the original MR data.  Contrast: 13mL MULTIHANCE GADOBENATE DIMEGLUMINE 529 MG/ML IV SOLN  Comparison:  Intraoperative cholangiogram 12/03/2011.  Findings:  MRCP demonstrates no intrahepatic ductal dilatation, no common bile duct dilatation, and no pancreatic duct dilatation. The gallbladder has been removed, and there is a trace amount of ascites and postoperative fluid in the gallbladder fossa and tracking along the right hepatic lobe.  MRCP images demonstrate no definite filling defects in the common bile  duct to suggest the presence of choledocholelithiasis at this time.  Other incidental findings include a subcentimeter lesion in the posterior aspect of the upper pole of the left kidney which is hyperintense on T2-weighted images, hypointense on T1-weighted images, and does not enhance, compatible with a tiny simple cyst. A similar appearing 2 cm lesion in the left renal pelvis region is compatible with a peripelvic cyst.  No focal hepatic lesions are noted.  Visualized portions of the pancreas, spleen, bilateral adrenal glands and right kidney are unremarkable.  There is very slight loss of signal intensity throughout the hepatic parenchyma on the out of phase dual echo images, suggesting very mild hepatic steatosis.  IMPRESSION: 1.  No evidence of choledocholelithiasis or biliary tract obstruction. 2.  Status post cholecystectomy with a trace amount of postoperative fluid in the gallbladder fossa and tracking down along the margin of the right lobe of the liver.  These findings are expected in the immediate  postoperative state. 3.  Mild hepatic steatosis. 4.  Small left renal peripelvic cyst and tiny upper pole left renal cyst, as above.   Original Report Authenticated By: Florencia Reasons, M.D.    Dg Abd Acute W/chest  12/13/2011  *RADIOLOGY REPORT*  Clinical Data: Abdomen pain and nausea  ACUTE ABDOMEN SERIES (ABDOMEN 2 VIEW & CHEST 1 VIEW)  Comparison: None.  Findings:  PA chest:  Lungs clear.  Heart size and pulmonary vascularity are normal.  No adenopathy.  Supine and upright abdomen:  There is moderate stool in the colon. The bowel gas pattern is normal.  No obstruction or free air.  There are surgical clips in the gallbladder fossa region.  There are phleboliths in the pelvis.  There is lumbar levoscoliosis.  IMPRESSION:  Lungs clear.  Nonspecific gas pattern.   Original Report Authenticated By: Arvin Collard. WOODRUFF III, M.D.    Mr Abd W/wo Cm/mrcp  12/05/2011  *RADIOLOGY REPORT*  Clinical Data:   Possible choledocholithiasis on cholangiogram.  MRI ABDOMEN WITHOUT AND WITH CONTRAST (MRCP)  Technique:  Multiplanar multisequence MR imaging of the abdomen was performed without and with contrast, including heavily T2-weighted images of the biliary and pancreatic ducts.  Three-dimensional MR images were rendered by post processing of the original MR data.  Contrast: 13mL MULTIHANCE GADOBENATE DIMEGLUMINE 529 MG/ML IV SOLN  Comparison:  Intraoperative cholangiogram 12/03/2011.  Findings:  MRCP demonstrates no intrahepatic ductal dilatation, no common bile duct dilatation, and no pancreatic duct dilatation. The gallbladder has been removed, and there is a trace amount of ascites and postoperative fluid in the gallbladder fossa and tracking along the right hepatic lobe.  MRCP images demonstrate no definite filling defects in the common bile duct to suggest the presence of choledocholelithiasis at this time.  Other incidental findings include a subcentimeter lesion in the posterior aspect of the upper pole of the left kidney which is hyperintense on T2-weighted images, hypointense on T1-weighted images, and does not enhance, compatible with a tiny simple cyst. A similar appearing 2 cm lesion in the left renal pelvis region is compatible with a peripelvic cyst.  No focal hepatic lesions are noted.  Visualized portions of the pancreas, spleen, bilateral adrenal glands and right kidney are unremarkable.  There is very slight loss of signal intensity throughout the hepatic parenchyma on the out of phase dual echo images, suggesting very mild hepatic steatosis.  IMPRESSION: 1.  No evidence of choledocholelithiasis or biliary tract obstruction. 2.  Status post cholecystectomy with a trace amount of postoperative fluid in the gallbladder fossa and tracking down along the margin of the right lobe of the liver.  These findings are expected in the immediate postoperative state. 3.  Mild hepatic steatosis. 4.  Small left renal  peripelvic cyst and tiny upper pole left renal cyst, as above.   Original Report Authenticated By: Florencia Reasons, M.D.     1. Abdominal pain   2. Blurred vision   3. Dizziness       MDM  Multiple somatic complaints. She has had extensive workup. Her reviewed her past records and she has had MRI of her abdomen, a CT of her head, MRI of her head. She has been evaluated at [facility] with the CDU where she was diagnosed with H. pylori. She recently had gallbladder taken down to see if this would help his symptoms and it has not. She is very insistent on seeing multiple specialists. On exam today, the only hard findings I  have are abdominal tenderness related to recent surgery. Screening labs will be obtained to make sure there is no acute pathology. Her abdominal pain this morning most likely was splenic flexure syndrome. Plain films of the abdomen will be obtained. I am complying with her request for referral to urologist and neurologist. However, I have recommended that she see an optometrist to make sure that her blurry vision does not have at its root in her actual invasion. Once her physical complaints of been thoroughly evaluated, she would probably benefit from psychiatric evaluation. 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        Dione Booze, MD 12/13/11 1022

## 2011-12-13 NOTE — ED Notes (Signed)
Patient transported to X-ray 

## 2011-12-13 NOTE — ED Notes (Signed)
MD at bedside. 

## 2011-12-14 ENCOUNTER — Encounter: Payer: Self-pay | Admitting: Family Medicine

## 2011-12-14 ENCOUNTER — Ambulatory Visit (INDEPENDENT_AMBULATORY_CARE_PROVIDER_SITE_OTHER): Payer: Medicare Other | Admitting: Family Medicine

## 2011-12-14 VITALS — BP 113/67 | HR 65 | Temp 97.8°F | Ht 61.0 in | Wt 130.4 lb

## 2011-12-14 DIAGNOSIS — R42 Dizziness and giddiness: Secondary | ICD-10-CM

## 2011-12-14 NOTE — Patient Instructions (Signed)
It was good to meet you today. I think the specialist that you will get the most benefit from is the neurologist. Please go ahead and see all the doctors you have appointment with in the next several weeks and have them fax copies of their notes to [facility] at [phone], attention Dr. Louanne Belton Plan on coming back to see me a week or so after your last specialist appointment so we can try to put things together. It is OK for you to stop taking the lorazepam at this time.

## 2011-12-14 NOTE — Progress Notes (Signed)
Patient ID: Melanie [name], female   DOB: 1975-05-04, 36 y.o.   MRN: [id number] Subjective: The patient is a 36 y.o. year old female who presents today for f/u.  1. Dizziness: Patient complains of lightheadedness that is present throughout the day.  No sense of room spinning.  Also has problems with blurred vision.  She saw a neurologist some time ago and at that time arrived at no diagnosis.  She has had several imaging studies of the head recently, none of which have shown any abnormality.  She feels very debilitated by these symptoms.  She has decided that the ativan she was started on was not helping any of the symptoms and has started weaning herself off of them.  She thinks that her problems might be related to her reproductive tract and so has scheduled an appointment with a GYN.  She has also scheduled appointments with several other specialists as she wants their opinions about what is going on with her.  She does not have a repeat appointment with a neurologist.  Patient's past medical, social, and family history were reviewed and updated as appropriate. History  Substance Use Topics  . Smoking status: Never Smoker   . Smokeless tobacco: Not on file  . Alcohol Use: No   Objective:  Filed Vitals:   12/14/11 1006  BP: 113/67  Pulse: 65  Temp: 97.8 F (36.6 C)   Gen: NAD, anxious HEENT: MMM, EOMI, TM normal bilaterally, no cranial nerve deficits, no nystagmas CV: RRR Resp: CTABL Ext: No edema Neuro: CN 2-12 intact, no balance problems, gait is narrow based with no hesitation.  Assessment/Plan:  Please also see individual problems in problem list for problem-specific plans.

## 2011-12-19 ENCOUNTER — Other Ambulatory Visit (HOSPITAL_COMMUNITY): Payer: Self-pay | Admitting: Obstetrics and Gynecology

## 2011-12-19 ENCOUNTER — Encounter (INDEPENDENT_AMBULATORY_CARE_PROVIDER_SITE_OTHER): Payer: Self-pay | Admitting: General Surgery

## 2011-12-19 ENCOUNTER — Ambulatory Visit (INDEPENDENT_AMBULATORY_CARE_PROVIDER_SITE_OTHER): Payer: Medicare Other | Admitting: General Surgery

## 2011-12-19 VITALS — BP 132/92 | HR 89 | Temp 97.9°F | Ht 61.0 in | Wt 129.8 lb

## 2011-12-19 DIAGNOSIS — Z1231 Encounter for screening mammogram for malignant neoplasm of breast: Secondary | ICD-10-CM

## 2011-12-19 DIAGNOSIS — K6289 Other specified diseases of anus and rectum: Secondary | ICD-10-CM

## 2011-12-19 DIAGNOSIS — Z5189 Encounter for other specified aftercare: Secondary | ICD-10-CM

## 2011-12-19 DIAGNOSIS — Z4889 Encounter for other specified surgical aftercare: Secondary | ICD-10-CM

## 2011-12-19 NOTE — Progress Notes (Signed)
Subjective:     Patient ID: Melanie [name], female   DOB: 02-Apr-1975, 36 y.o.   MRN: [id number]  HPI This patient follows up status post left upper cholecystectomy with intraoperative cholangiogram. She had what appeared to be an abnormal cholangiogram and so we ordered a followup MRCP which was negative she has had followup laboratory studies as well and LFTs continue to be normal. She says that she feels much better his not having the nausea and vomiting and she is able to eat now. She says that she has gained 10 pounds since her surgery. She says that her bowels are more frequent than in previous which is to be expected is early after surgery. Her only complaint is some persistent dizziness which she has been having issues with for several months now. She says that she is scheduled to have this evaluated with some other appointments including an ophthalmology appointment tomorrow.  Review of Systems     Objective:   Physical Exam No distress and nontoxic-appearing Her abdomen is soft nontender and exam and her incisions are well-healed without sign of infection.    Assessment:     Status post laparoscopic cholecystectomy with intraoperative cholangiogram-improved She seems to be feeling much better and is able to eat now without difficulty. Her pathology was benign and her followup LFTs are normal. She has no evidence of any postoperative complication. She still has some other complaints of dizziness which have yet to be evaluated by other specialties. I think she is doing well from surgery standpoint and can increase her activity as tolerated and follow with me on a when necessary basis    Plan:     followup with surgery when necessary

## 2011-12-20 NOTE — Assessment & Plan Note (Signed)
Patient has had multiple investigations, it would appear.  She has a number of specialist appointments pending.  I will not change the course on these at this time.  I will encourage her to return to her neurologist for a second appointment (she reports only one for this problem) and have all specialists fax copies of their impressions to me.  She can then see me again and we can decide where to go from there.  It is hard to decide where to go with this patient's symptoms considering that there have been no positive studies recently.

## 2011-12-21 ENCOUNTER — Ambulatory Visit (HOSPITAL_COMMUNITY): Payer: Medicare Other

## 2011-12-25 ENCOUNTER — Ambulatory Visit (HOSPITAL_COMMUNITY)
Admission: RE | Admit: 2011-12-25 | Discharge: 2011-12-25 | Disposition: A | Discharge status: Home / Self Care | Payer: Medicare Other | Source: Ambulatory Visit | Attending: Obstetrics and Gynecology | Admitting: Obstetrics and Gynecology

## 2011-12-25 ENCOUNTER — Telehealth: Payer: Self-pay | Admitting: Family Medicine

## 2011-12-25 ENCOUNTER — Other Ambulatory Visit (HOSPITAL_COMMUNITY): Payer: Self-pay | Admitting: Obstetrics and Gynecology

## 2011-12-25 DIAGNOSIS — K6289 Other specified diseases of anus and rectum: Secondary | ICD-10-CM

## 2011-12-25 DIAGNOSIS — Z1231 Encounter for screening mammogram for malignant neoplasm of breast: Secondary | ICD-10-CM | POA: Insufficient documentation

## 2011-12-25 DIAGNOSIS — N838 Other noninflammatory disorders of ovary, fallopian tube and broad ligament: Secondary | ICD-10-CM | POA: Insufficient documentation

## 2011-12-25 DIAGNOSIS — N949 Unspecified condition associated with female genital organs and menstrual cycle: Secondary | ICD-10-CM | POA: Insufficient documentation

## 2011-12-25 NOTE — Telephone Encounter (Signed)
Patient is calling to find out if she has ever been tested for Diabetes.  She is wondering because she has a lot of pain in her eyes and if she waits a long time to eat she feels bad, but if she eats something with sugar, she starts to feel better.

## 2011-12-26 NOTE — Telephone Encounter (Signed)
Called pt and told her the last glucose was 95. I asked her how often she eats she stated every  3-4 hours. She has cut back on the amount of sodas that she was drinking (about 7 qd) mostly drinks water. Pt also told me that she has had her eyes checked within the last month and they could not find anything wrong. I told her that I would forward this message to her pcp for follow up.Laureen Ochs, Viann Shove

## 2011-12-28 NOTE — Telephone Encounter (Signed)
Pt given information. Told her that if her sxs should get worse to please call and make an appt to be seen. Pt understood and agreed. Loralee Pacas Icard

## 2011-12-28 NOTE — Telephone Encounter (Addendum)
A1c earlier this year was normal.  She does not have diabetes.  There is no need or indication to check again at this time.

## 2012-01-07 ENCOUNTER — Ambulatory Visit (HOSPITAL_COMMUNITY): Payer: Medicare Other

## 2012-01-08 ENCOUNTER — Other Ambulatory Visit: Payer: Self-pay | Admitting: Dermatology

## 2012-01-12 ENCOUNTER — Emergency Department: Payer: Self-pay | Admitting: Emergency Medicine

## 2012-01-12 LAB — COMPREHENSIVE METABOLIC PANEL WITH GFR
Albumin: 3.8 g/dL
Alkaline Phosphatase: 72 U/L
Anion Gap: 7
BUN: 10 mg/dL
Bilirubin,Total: 0.3 mg/dL
Calcium, Total: 8.6 mg/dL
Chloride: 106 mmol/L
Co2: 28 mmol/L
Creatinine: 0.59 mg/dL — ABNORMAL LOW
EGFR (African American): >60
EGFR (Non-African Amer.): >60
Glucose: 104 mg/dL — ABNORMAL HIGH
Osmolality: 281
Potassium: 3.1 mmol/L — ABNORMAL LOW
SGOT(AST): 11 U/L — ABNORMAL LOW
SGPT (ALT): 16 U/L
Sodium: 141 mmol/L
Total Protein: 7.9 g/dL

## 2012-01-12 LAB — URINALYSIS, COMPLETE
Bacteria: NONE SEEN
Bilirubin,UR: NEGATIVE
Blood: NEGATIVE
Glucose,UR: NEGATIVE mg/dL
Ketone: NEGATIVE
Leukocyte Esterase: NEGATIVE
Nitrite: NEGATIVE
Ph: 8
Protein: NEGATIVE
RBC,UR: NONE SEEN /HPF
Specific Gravity: 1.008
Squamous Epithelial: 2
WBC UR: NONE SEEN /HPF

## 2012-01-12 LAB — CBC
MCHC: 34.5 g/dL (ref 32.0–36.0)
MCV: 87 fL (ref 80–100)
Platelet: 266 10*3/uL (ref 150–440)
RDW: 14 % (ref 11.5–14.5)
WBC: 7.5 10*3/uL (ref 3.6–11.0)

## 2012-01-14 ENCOUNTER — Encounter: Payer: Self-pay | Admitting: Family Medicine

## 2012-01-14 ENCOUNTER — Ambulatory Visit (INDEPENDENT_AMBULATORY_CARE_PROVIDER_SITE_OTHER): Payer: Medicare Other | Admitting: Family Medicine

## 2012-01-14 VITALS — BP 116/71 | HR 86 | Temp 97.6°F | Wt 134.0 lb

## 2012-01-14 DIAGNOSIS — R42 Dizziness and giddiness: Secondary | ICD-10-CM

## 2012-01-14 DIAGNOSIS — R590 Localized enlarged lymph nodes: Secondary | ICD-10-CM

## 2012-01-14 DIAGNOSIS — R599 Enlarged lymph nodes, unspecified: Secondary | ICD-10-CM

## 2012-01-14 NOTE — Patient Instructions (Signed)
We will reschedule you for a 5 hour glucose test to see what your blood sugar is doing. Keep your ENT appointment.  I think that they are the best ones for deciding on a test to do for your lymph nodes.  If they think there are things for me to do, I am happy to do so.  Please have them fax copies of their office notes to [place].

## 2012-01-16 ENCOUNTER — Emergency Department (HOSPITAL_COMMUNITY)
Admission: EM | Admit: 2012-01-16 | Discharge: 2012-01-16 | Discharge status: Against Medical Advice | Payer: Medicare Other | Attending: Emergency Medicine | Admitting: Emergency Medicine

## 2012-01-16 ENCOUNTER — Encounter (HOSPITAL_COMMUNITY): Payer: Self-pay | Admitting: Family Medicine

## 2012-01-16 DIAGNOSIS — R599 Enlarged lymph nodes, unspecified: Secondary | ICD-10-CM | POA: Insufficient documentation

## 2012-01-16 DIAGNOSIS — R55 Syncope and collapse: Secondary | ICD-10-CM | POA: Insufficient documentation

## 2012-01-16 DIAGNOSIS — R51 Headache: Secondary | ICD-10-CM | POA: Insufficient documentation

## 2012-01-16 DIAGNOSIS — J029 Acute pharyngitis, unspecified: Secondary | ICD-10-CM | POA: Insufficient documentation

## 2012-01-16 LAB — GLUCOSE, CAPILLARY

## 2012-01-16 NOTE — ED Notes (Signed)
Pt sts she is leaving 

## 2012-01-16 NOTE — ED Notes (Signed)
Per pt sts she was at the store today and felt like she was going to pass out. sts sore throat, lymph nodes swollen, HA, low blood sugar. sts she has to eat every 4 hours or she will pass out. sts this has been ongoing since May.

## 2012-01-17 ENCOUNTER — Encounter: Payer: Self-pay | Admitting: Family Medicine

## 2012-01-17 DIAGNOSIS — R590 Localized enlarged lymph nodes: Secondary | ICD-10-CM | POA: Insufficient documentation

## 2012-01-17 NOTE — Assessment & Plan Note (Signed)
I will recommend that the patient continue to see neurology for them to complete her workup. The only thing I feel I can add is a glucose tolerance test. It is possible that she is experiencing hypoglycemia and that this is causing her symptoms. She does not have diabetes, however symptomatic hypoglycemia could still be a problem. We will schedule this for later this week.

## 2012-01-17 NOTE — Assessment & Plan Note (Signed)
The patient's exam is not particularly impressive. I do not recall her having mentioned this to me at her previous visit. Differential for persistent cervical adenopathy would include repeated sinus infections, dental process, lymphoma or other head and neck cancer. Overall, I am not particularly concerned about my findings. As she has an appointment with ENT in the near future I will refrain from any further workup. I am happy to help facilitate in any way that is needed.

## 2012-01-17 NOTE — Progress Notes (Signed)
Patient ID: Melanie [name], female   DOB: 1975-08-28, 36 y.o.   MRN: [id number] Subjective: The patient is a 36 y.o. year old female who presents today for multiple complaints.  The patient presents today with multiple complaints. She continues to have significant problems with dizziness, blurred vision, and lightheadedness. She reports that these become particularly notable when she has not eaten in 4 hours. She has not had any true syncopal events. She has a followup appointment with neurology scheduled for tomorrow. She is not a blood sugar meter but is convinced that her problems are being caused by diabetes. Of note, her last A1c was less than 6 several months ago. She is not having much in the way of headaches. She is not having any weakness, numbness, or tingling of extremities.  The patient is also concerned about lymph nodes in her neck. She reports that she has told us about them multiple times since the urine approximately 6 months ago and she feels that no one has addressed. She was in the emergency room this past weekend for a near syncopal episode. Recommend referral to ENT and she has an appointment with them in 3 days. She denies any difficulty with swallowing. She has not noticed lumps and bumps elsewhere on her body.  Patient's past medical, social, and family history were reviewed and updated as appropriate. History  Substance Use Topics  . Smoking status: Never Smoker   . Smokeless tobacco: Not on file  . Alcohol Use: No   Objective:  Filed Vitals:   01/14/12 1110  BP: 116/71  Pulse: 86  Temp: 97.6 F (36.4 C)   Gen: No acute distress, chronically anxious HEENT: Mucous membranes moist, extraocular movements intact, pupils equal round reactive to light. There are several small, mobile lymph nodes in the anterior chain. There are no supraclavicular nodes. CV: Regular rate and rhythm, no murmurs Resp: Clear to auscultation bilaterally Ext: No  edema  Assessment/Plan: The patient is a somewhat anxious patient who has had the misfortune of being diagnosed with a rare condition (meningioma) involving the CNS a number of years ago. She continues to feel there must be something bad going on with her and is in the process of seeing multiple specialists. Her workup thus far has been completely unrevealing. She has had repeat imaging this year demonstrating no evidence of recurrence of her meningioma. Neurology has not been particularly helpful with her symptoms although they are evaluating the possibility of intermittent partial seizures (this is what I would assume they are doing. I have not seen any notes however the patient reports they have an EEG scheduled.) Laboratory studies thus far have been completely unrevealing.  Please also see individual problems in problem list for problem-specific plans.

## 2012-01-18 ENCOUNTER — Other Ambulatory Visit: Payer: Medicare Other

## 2012-01-18 DIAGNOSIS — R42 Dizziness and giddiness: Secondary | ICD-10-CM

## 2012-01-18 DIAGNOSIS — R590 Localized enlarged lymph nodes: Secondary | ICD-10-CM

## 2012-01-18 LAB — GLUCOSE, CAPILLARY
Comment 1: 3
Glucose-Capillary: 63 mg/dL — ABNORMAL LOW (ref 70–99)

## 2012-01-18 NOTE — Progress Notes (Signed)
4 HR GTT DONE TODAY Melanie [name]

## 2012-01-22 ENCOUNTER — Telehealth: Payer: Self-pay | Admitting: Family Medicine

## 2012-01-22 DIAGNOSIS — E162 Hypoglycemia, unspecified: Secondary | ICD-10-CM

## 2012-01-22 NOTE — Telephone Encounter (Signed)
Returned call to patient.  Informed that Dr. Louanne Belton has not reviewed results yet.  Not in office this afternoon, but will be in clinic tomorrow morning.  Patient states that she has been "feeling shaky and has to eat every 3 hours."  Wants to know if her 3 hr GTT shows diabetes.  Will page Dr. Louanne Belton and call patient back.  Per Dr. Moss Mc does not show diabetes.  Will discuss with preceptor for plan and call patient in next 1-2 days.  Patient informed and agreeable to plan.  Gaylene Brooks, RN

## 2012-01-22 NOTE — Telephone Encounter (Signed)
Pt is calling again for her results

## 2012-01-22 NOTE — Telephone Encounter (Signed)
Pt is upset that no one has called with results - she feels really bad and wants to know if she is diabetic or not.

## 2012-01-22 NOTE — Telephone Encounter (Signed)
Patient is calling for the results of her GTT

## 2012-01-23 DIAGNOSIS — E162 Hypoglycemia, unspecified: Secondary | ICD-10-CM | POA: Insufficient documentation

## 2012-01-23 NOTE — Telephone Encounter (Signed)
LVMOM on informing of below message that patient try to fast 24 hours before the labs on Thursday

## 2012-01-23 NOTE — Telephone Encounter (Signed)
Informed of results.  Will check insulin levels and for exogenous hypoglycemics.  If abnormal will plan referral to endocrine for further workup.  Please call patient and inform her that she should do her best to fast for 24 hours prior to blood tests.

## 2012-01-24 ENCOUNTER — Other Ambulatory Visit: Payer: Medicare Other

## 2012-01-25 ENCOUNTER — Other Ambulatory Visit: Payer: Medicare Other

## 2012-01-25 DIAGNOSIS — E162 Hypoglycemia, unspecified: Secondary | ICD-10-CM

## 2012-01-25 NOTE — Progress Notes (Signed)
LABS DONE TODAY PER DR RITCH Melanie [name] 

## 2012-01-28 ENCOUNTER — Other Ambulatory Visit: Payer: Self-pay

## 2012-01-28 ENCOUNTER — Encounter (HOSPITAL_COMMUNITY): Payer: Self-pay | Admitting: Emergency Medicine

## 2012-01-28 ENCOUNTER — Emergency Department (HOSPITAL_COMMUNITY)
Admission: EM | Admit: 2012-01-28 | Discharge: 2012-01-28 | Disposition: A | Discharge status: Home Health | Payer: Medicare Other | Attending: Emergency Medicine | Admitting: Emergency Medicine

## 2012-01-28 DIAGNOSIS — G40909 Epilepsy, unspecified, not intractable, without status epilepticus: Secondary | ICD-10-CM | POA: Insufficient documentation

## 2012-01-28 DIAGNOSIS — Z3202 Encounter for pregnancy test, result negative: Secondary | ICD-10-CM | POA: Insufficient documentation

## 2012-01-28 DIAGNOSIS — R5381 Other malaise: Secondary | ICD-10-CM | POA: Insufficient documentation

## 2012-01-28 DIAGNOSIS — R42 Dizziness and giddiness: Secondary | ICD-10-CM | POA: Insufficient documentation

## 2012-01-28 DIAGNOSIS — Z87448 Personal history of other diseases of urinary system: Secondary | ICD-10-CM | POA: Insufficient documentation

## 2012-01-28 DIAGNOSIS — J45909 Unspecified asthma, uncomplicated: Secondary | ICD-10-CM | POA: Insufficient documentation

## 2012-01-28 DIAGNOSIS — Z85841 Personal history of malignant neoplasm of brain: Secondary | ICD-10-CM | POA: Insufficient documentation

## 2012-01-28 DIAGNOSIS — Z8669 Personal history of other diseases of the nervous system and sense organs: Secondary | ICD-10-CM | POA: Insufficient documentation

## 2012-01-28 DIAGNOSIS — R109 Unspecified abdominal pain: Secondary | ICD-10-CM | POA: Insufficient documentation

## 2012-01-28 DIAGNOSIS — Z86718 Personal history of other venous thrombosis and embolism: Secondary | ICD-10-CM | POA: Insufficient documentation

## 2012-01-28 DIAGNOSIS — Z8673 Personal history of transient ischemic attack (TIA), and cerebral infarction without residual deficits: Secondary | ICD-10-CM | POA: Insufficient documentation

## 2012-01-28 DIAGNOSIS — R002 Palpitations: Secondary | ICD-10-CM | POA: Insufficient documentation

## 2012-01-28 DIAGNOSIS — Z79899 Other long term (current) drug therapy: Secondary | ICD-10-CM | POA: Insufficient documentation

## 2012-01-28 DIAGNOSIS — R55 Syncope and collapse: Secondary | ICD-10-CM | POA: Insufficient documentation

## 2012-01-28 DIAGNOSIS — F411 Generalized anxiety disorder: Secondary | ICD-10-CM | POA: Insufficient documentation

## 2012-01-28 DIAGNOSIS — Z8719 Personal history of other diseases of the digestive system: Secondary | ICD-10-CM | POA: Insufficient documentation

## 2012-01-28 DIAGNOSIS — N898 Other specified noninflammatory disorders of vagina: Secondary | ICD-10-CM | POA: Insufficient documentation

## 2012-01-28 LAB — URINALYSIS, ROUTINE W REFLEX MICROSCOPIC
Bilirubin Urine: NEGATIVE
Hgb urine dipstick: NEGATIVE
Ketones, ur: NEGATIVE mg/dL
Nitrite: NEGATIVE
Specific Gravity, Urine: 1.008 (ref 1.005–1.030)
Urobilinogen, UA: 0.2 mg/dL (ref 0.0–1.0)
pH: 8 (ref 5.0–8.0)

## 2012-01-28 LAB — WET PREP, GENITAL
Trich, Wet Prep: NONE SEEN
Yeast Wet Prep HPF POC: NONE SEEN

## 2012-01-28 LAB — POCT I-STAT, CHEM 8
Hemoglobin: 13.3 g/dL (ref 12.0–15.0)
Potassium: 4.2 mEq/L (ref 3.5–5.1)
Sodium: 140 mEq/L (ref 135–145)
TCO2: 24 mmol/L (ref 0–100)

## 2012-01-28 LAB — URINE MICROSCOPIC-ADD ON

## 2012-01-28 LAB — GLUCOSE, CAPILLARY: Glucose-Capillary: 84 mg/dL (ref 70–99)

## 2012-01-28 MED ORDER — ONDANSETRON HCL 4 MG/2ML IJ SOLN
4.0000 mg | Freq: Once | INTRAMUSCULAR | Status: DC
  Filled 2012-01-28: qty 2

## 2012-01-28 MED ORDER — SODIUM CHLORIDE 0.9 % IV BOLUS (SEPSIS)
1000.0000 mL | Freq: Once | INTRAVENOUS | Status: AC
  Administered 2012-01-28: 1000 mL via INTRAVENOUS

## 2012-01-28 NOTE — ED Notes (Signed)
Pt's CBG was 84 when I checked it. 12:27pm JG

## 2012-01-28 NOTE — ED Notes (Signed)
Onset May of 2013 general weakness and dizziness.  Had Gallbladder removed one month ago. Has general weakness, dizziness, and nausea continued today.  Had glucose test on Friday at [facility] office.  Pending results. EMS stated CBG 85.

## 2012-01-28 NOTE — ED Provider Notes (Signed)
Medical screening examination/treatment/procedure(s) were performed by non-physician practitioner and as supervising physician I was immediately available for consultation/collaboration.  Vonette Grosso, MD 01/28/12 1549 

## 2012-01-28 NOTE — ED Provider Notes (Signed)
History     CSN: [id number]  Arrival date & time 01/28/12  3086   First MD Initiated Contact with Patient 01/28/12 [id number]      Chief Complaint  Patient presents with  . Nausea    (Consider location/radiation/quality/duration/timing/severity/associated sxs/prior treatment) HPI  36 year old female with multiple comorbidities presents complaining of nausea. Patient reports for the past 8-9 months she has had general weakness and dizziness with sensation of going to pass out. Today while sitting in the bathtub pt felt lightheadedness and nearly passed out.  She felt "jittery" and "heart racing".  Pt felt that her blood sugar is low (no hx of diabetes). This sensation has been ongoing for many months, with prior evaluations that shows no acute findings.   Sxs worsen this AM after taking her potassium pill with orange juice.  Furthermore, pt also notice lower abdominal pain this AM.Pain is a crampy sensation, non radiating, mild/moderate severity, without dysuria.  She does endorse mild vaginal discharge, every day for 2 weeks.  Has hx of ovarian cyst.  No prior hx of STD. No pain with sexual activity.  Pt sts she also had her gallbladder removed several months ago, but it didn't help with her sxs.  Ottherwise pt denies fever, chills, cp, sob, back pain, or rash.       Past Medical History  Diagnosis Date  . TIA (transient ischemic attack)   . Seizures   . Brain tumor 2011  . Embolism - blood clot in left ovary  . Meningioma 08/14/2011    S/p craniotomy Encephaloma present non changing on MRI in 08/2011  . Anxiety   . Gallstones   . Asthma   . Clotting disorder   . Stroke   . Chills   . Unintentional weight loss   . Visual disturbance   . Palpitations   . Abdominal pain   . Rectal bleeding   . Nausea   . Rectal pain   . Blood in urine   . Weakness   . Confusion     Past Surgical History  Procedure Date  . Brain tumor removed 2011  . Ovarian mass removed 2010    left  .  Cholecystectomy 12/03/2011    Procedure: LAPAROSCOPIC CHOLECYSTECTOMY WITH INTRAOPERATIVE CHOLANGIOGRAM;  Surgeon: Lodema Pilot, DO;  Location: [facility] OR;  Service: General;  Laterality: N/A;    Family History  Problem Relation Age of Onset  . Depression Mother   . Alcohol abuse Sister   . Alcohol abuse Brother   . Alzheimer's disease Maternal Grandmother   . Diabetes Maternal Grandmother   . Stroke Maternal Grandmother   . Heart failure Father   . Hypertension Father   . Heart disease Father     History  Substance Use Topics  . Smoking status: Never Smoker   . Smokeless tobacco: Not on file  . Alcohol Use: No    OB History    Grav Para Term Preterm Abortions TAB SAB Ect Mult Living                  Review of Systems  All other systems reviewed and are negative.    Allergies  Morphine and related  Home Medications   Current Outpatient Rx  Name  Route  Sig  Dispense  Refill  . ADULT MULTIVITAMIN W/MINERALS CH   Oral   Take 1 tablet by mouth daily.           BP 110/67  Pulse 82  Temp 98.6  F (37 C) (Oral)  Resp 14  SpO2 99%  Physical Exam  Nursing note and vitals reviewed. Constitutional: She is oriented to person, place, and time. She appears well-developed and well-nourished. No distress.  HENT:  Head: Normocephalic and atraumatic.  Right Ear: External ear normal.  Left Ear: External ear normal.  Eyes: Conjunctivae normal and EOM are normal. Pupils are equal, round, and reactive to light.  Neck: Normal range of motion. Neck supple.  Cardiovascular: Normal rate and regular rhythm.   Pulmonary/Chest: Effort normal and breath sounds normal. She exhibits no tenderness.  Abdominal: Soft. There is no tenderness. There is no rebound and no guarding.  Genitourinary: Vagina normal and uterus normal. There is no rash or lesion on the right labia. There is no rash or lesion on the left labia. Cervix exhibits no motion tenderness and no discharge. Right adnexum  displays no mass and no tenderness. Left adnexum displays no mass and no tenderness. No erythema, tenderness or bleeding around the vagina. No vaginal discharge found.       Chaperone present  Musculoskeletal: Normal range of motion. She exhibits no edema.  Lymphadenopathy:       Right: No inguinal adenopathy present.       Left: No inguinal adenopathy present.  Neurological: She is alert and oriented to person, place, and time. She has normal strength. No cranial nerve deficit or sensory deficit. She displays a negative Romberg sign. Coordination and gait normal. GCS eye subscore is 4. GCS verbal subscore is 5. GCS motor subscore is 6.  Skin: Skin is warm. No rash noted.  Psychiatric: She has a normal mood and affect.    ED Course  Procedures (including critical care time)  Labs Reviewed - No data to display No results found.   No diagnosis found.  Results for orders placed during the hospital encounter of 01/28/12  URINALYSIS, ROUTINE W REFLEX MICROSCOPIC      Component Value Range   Color, Urine YELLOW  YELLOW   APPearance CLEAR  CLEAR   Specific Gravity, Urine 1.008  1.005 - 1.030   pH 8.0  5.0 - 8.0   Glucose, UA NEGATIVE  NEGATIVE mg/dL   Hgb urine dipstick NEGATIVE  NEGATIVE   Bilirubin Urine NEGATIVE  NEGATIVE   Ketones, ur NEGATIVE  NEGATIVE mg/dL   Protein, ur NEGATIVE  NEGATIVE mg/dL   Urobilinogen, UA 0.2  0.0 - 1.0 mg/dL   Nitrite NEGATIVE  NEGATIVE   Leukocytes, UA SMALL (*) NEGATIVE  PREGNANCY, URINE      Component Value Range   Preg Test, Ur NEGATIVE  NEGATIVE  WET PREP, GENITAL      Component Value Range   Yeast Wet Prep HPF POC NONE SEEN  NONE SEEN   Trich, Wet Prep NONE SEEN  NONE SEEN   Clue Cells Wet Prep HPF POC NONE SEEN  NONE SEEN   WBC, Wet Prep HPF POC MODERATE (*) NONE SEEN  POCT I-STAT, CHEM 8      Component Value Range   Sodium 140  135 - 145 mEq/L   Potassium 4.2  3.5 - 5.1 mEq/L   Chloride 105  96 - 112 mEq/L   BUN 10  6 - 23 mg/dL    Creatinine, Ser 1.61  0.50 - 1.10 mg/dL   Glucose, Bld 93  70 - 99 mg/dL   Calcium, Ion 0.96  0.45 - 1.23 mmol/L   TCO2 24  0 - 100 mmol/L   Hemoglobin 13.3  12.0 -  15.0 g/dL   HCT 16.1  09.6 - 04.5 %  URINE MICROSCOPIC-ADD ON      Component Value Range   Squamous Epithelial / LPF FEW (*) RARE   WBC, UA 3-6  <3 WBC/hpf   RBC / HPF 0-2  <3 RBC/hpf   Bacteria, UA RARE  RARE   No results found.   Date: 01/28/2012  Rate: 71  Rhythm: normal sinus rhythm  QRS Axis: normal  Intervals: normal  ST/T Wave abnormalities: normal  Conduction Disutrbances: none  Narrative Interpretation:   Old EKG Reviewed: No significant changes noted  1. Near syncope   MDM  Pt presents c/o lightheadedness, near syncope and "i think my blood sugar is low".  She also endorse lower abdominal discomfort with vaginal discharge, no dysuria.    On exam, pelvic exam is unremarkable and do not support PID or Ovarian torsion.  Pt able to ambulate without difficulty, normal orthostatic vital sign.  She is afebrile, VSS.    12:01 PM Her work up has been unremarkable. No changes in ECG, no anemia, normal electrolytes, normal CBC, UA neg, pelvic exam unremarkable.   She is in NAD.  In reviewing her past records, she has had MRI of her head, and abdomen, as well as CT scan of her had.  I do not think she is having any life threatening medical condition at this time.  Recommend f/u with PCP for further care.     I have reviewed nursing notes and vital signs. I personally reviewed the imaging tests through PACS system  I reviewed available ER/hospitalization records thought the EMR     Fayrene Helper, [place] 01/28/12 1206

## 2012-01-29 ENCOUNTER — Telehealth: Payer: Self-pay | Admitting: Family Medicine

## 2012-01-29 DIAGNOSIS — R55 Syncope and collapse: Secondary | ICD-10-CM

## 2012-01-29 NOTE — Telephone Encounter (Signed)
Pt had a insulin test done on Friday and has not heard results - would like someone to call her by Wednesday afternoon.

## 2012-01-30 DIAGNOSIS — R55 Syncope and collapse: Secondary | ICD-10-CM | POA: Insufficient documentation

## 2012-01-30 LAB — BETA-HYDROXYBUTYRIC ACID: Beta-Hydroxybutyric Acid: 0.03 mmol/L

## 2012-01-30 NOTE — Telephone Encounter (Signed)
Informed patient of test results.  No evidence of excess insulin secretion and her mildly low GTT results are more consistent with her simply having a lower than reference range fasting CBG.  This certainly is not enough to explain her symptoms.  After further discussion with the patient, she has not been seen by a cardiologist (I have previously been under the impression that she had been evaluated by [facility]) so we will make this referral due to her repeated episodes of pre-syncope.

## 2012-02-01 ENCOUNTER — Ambulatory Visit: Payer: Medicare Other | Admitting: Family Medicine

## 2012-02-02 LAB — SULFONYLUREA HYPOGLYCEMICS PANEL, SERUM

## 2012-02-11 ENCOUNTER — Encounter: Payer: Self-pay | Admitting: Cardiovascular Disease

## 2012-02-11 ENCOUNTER — Ambulatory Visit (INDEPENDENT_AMBULATORY_CARE_PROVIDER_SITE_OTHER): Payer: Medicare Other | Admitting: Cardiovascular Disease

## 2012-02-11 VITALS — BP 121/77 | HR 92 | Ht 61.0 in | Wt 140.0 lb

## 2012-02-11 DIAGNOSIS — R55 Syncope and collapse: Secondary | ICD-10-CM

## 2012-02-11 DIAGNOSIS — D320 Benign neoplasm of cerebral meninges: Secondary | ICD-10-CM

## 2012-02-11 DIAGNOSIS — D329 Benign neoplasm of meninges, unspecified: Secondary | ICD-10-CM

## 2012-02-11 DIAGNOSIS — F329 Major depressive disorder, single episode, unspecified: Secondary | ICD-10-CM

## 2012-02-11 LAB — CORTISOL: Cortisol, Plasma: 9 ug/dL

## 2012-02-11 NOTE — Assessment & Plan Note (Signed)
Recent MRI with no recurrence  ? Ongoing symptoms from previous CNS surgery  F/U Pearlean Brownie

## 2012-02-11 NOTE — Assessment & Plan Note (Signed)
Indicates it stems from physical problems that no one can figure out.  F/U primary and needs psychiatry F/U with ongoing w/u of somatic complants

## 2012-02-11 NOTE — Patient Instructions (Addendum)
Your physician recommends that you schedule a follow-up appointment in:  AS NEEDED  Your physician recommends that you continue on your current medications as directed. Please refer to the Current Medication list given to you today.  Your physician recommends that you return for lab work in: TODAY CORTISOL  DX SYNCOPE

## 2012-02-11 NOTE — Progress Notes (Signed)
Patient ID: Melanie [name], female   DOB: [birth year], 36 y.o.   MRN: [id number] 36 yo with multiple comorbidities Seen in ER recently with abdominal pain  When she is sick and nauseated feels light headed.  Telemetry in ER normal  ECG 8.28  Normal with rate 71 sinus  Echo reviewed 08/13/11 and normal with EF 65%  She is clearly depressed.  Never has a good day.  Has had previous meningioma resection and sees Dr Pearlean Brownie in neurology.  Feels light headed a lot.  Not postural.  Feels pain behind left ey and congestion in ears.  No frank syncope.  Sensation of passing out not related to dyspnea, palpitations or chest pain.  More frequently related to head pressure and abdominal pain.  No documented cardiac disease with normal exams, telemetry, echo and ECG  Reviewed records and labs in Epic.  Has had normal TSH in July and ESR in August.  No cortisol seen and cannot seen that any myesthenia w/u been done.  MRI in June showed no evidence of MS with some encephalomalacia from meningioma surgery  ROS: Denies fever, malais, weight loss, blurry vision, decreased visual acuity, cough, sputum, SOB, hemoptysis, pleuritic pain, palpitaitons, heartburn, abdominal pain, melena, lower extremity edema, claudication, or rash.  All other systems reviewed and negative   General: Affect appropriate Depressed teary eyed female HEENT: normal Neck supple with no adenopathy JVP normal no bruits no thyromegaly Lungs clear with no wheezing and good diaphragmatic motion Heart:  S1/S2 no murmur,rub, gallop or click PMI normal Abdomen: benighn, BS positve, no tenderness, no AAA no bruit.  No HSM or HJR Distal pulses intact with no bruits No edema Neuro non-focal Skin warm and dry No muscular weakness  Medications Current Outpatient Prescriptions  Medication Sig Dispense Refill  . Multiple Vitamin (MULTIVITAMIN WITH MINERALS) TABS Take 1 tablet by mouth daily.        Allergies Morphine and related  Family  History: Family History  Problem Relation Age of Onset  . Depression Mother   . Alcohol abuse Sister   . Alcohol abuse Brother   . Alzheimer's disease Maternal Grandmother   . Diabetes Maternal Grandmother   . Stroke Maternal Grandmother   . Heart failure Father   . Hypertension Father   . Heart disease Father     Social History: History   Social History  . Marital Status: Divorced    Spouse Name: N/A    Number of Children: N/A  . Years of Education: N/A   Occupational History  . Not on file.   Social History Main Topics  . Smoking status: Never Smoker   . Smokeless tobacco: Not on file  . Alcohol Use: No  . Drug Use: No  . Sexually Active: Not on file   Other Topics Concern  . Not on file   Social History Narrative  . No narrative on file    Electrocardiogram:  01/30/12  NSR normal ECG rate 71  Assessment and Plan

## 2012-02-11 NOTE — Assessment & Plan Note (Signed)
Non cardiac no need for further testing.  Did encourage her to take BP at home and avoid hot baths in am which could vasodilate her.  She has a lot of issues related to previous meningioma surgery and depression.  Will check random cortisol and have primary consider anti acetoholine antibodies or mu SK atibodies.  No other signs of dysautonomia and not postural in office today

## 2012-03-06 ENCOUNTER — Telehealth: Payer: Self-pay | Admitting: Family Medicine

## 2012-03-06 NOTE — Telephone Encounter (Signed)
Pt calling because she is scheduled to appear for jury duty March 24, 2012 and due to her health conditions and her inability to drive, she does not feel that she is able to fulfill this obligation. She is asking for a letter to be written on her behalf so that she can be excused from this. Laureen Ochs, Viann Shove

## 2012-03-07 NOTE — Telephone Encounter (Signed)
Letter routed to admin.  Please notify patient when it is printed and available for pickup.

## 2012-05-09 ENCOUNTER — Emergency Department (HOSPITAL_COMMUNITY)
Admission: EM | Admit: 2012-05-09 | Discharge: 2012-05-09 | Disposition: A | Discharge status: Home / Self Care | Payer: Medicare Other | Attending: Emergency Medicine | Admitting: Emergency Medicine

## 2012-05-09 ENCOUNTER — Encounter (HOSPITAL_COMMUNITY): Payer: Self-pay | Admitting: Emergency Medicine

## 2012-05-09 DIAGNOSIS — Z3202 Encounter for pregnancy test, result negative: Secondary | ICD-10-CM | POA: Insufficient documentation

## 2012-05-09 DIAGNOSIS — Z8673 Personal history of transient ischemic attack (TIA), and cerebral infarction without residual deficits: Secondary | ICD-10-CM | POA: Insufficient documentation

## 2012-05-09 DIAGNOSIS — Z8669 Personal history of other diseases of the nervous system and sense organs: Secondary | ICD-10-CM | POA: Insufficient documentation

## 2012-05-09 DIAGNOSIS — Z8679 Personal history of other diseases of the circulatory system: Secondary | ICD-10-CM | POA: Insufficient documentation

## 2012-05-09 DIAGNOSIS — H6690 Otitis media, unspecified, unspecified ear: Secondary | ICD-10-CM | POA: Insufficient documentation

## 2012-05-09 DIAGNOSIS — R42 Dizziness and giddiness: Secondary | ICD-10-CM

## 2012-05-09 DIAGNOSIS — R079 Chest pain, unspecified: Secondary | ICD-10-CM | POA: Insufficient documentation

## 2012-05-09 DIAGNOSIS — Z862 Personal history of diseases of the blood and blood-forming organs and certain disorders involving the immune mechanism: Secondary | ICD-10-CM | POA: Insufficient documentation

## 2012-05-09 DIAGNOSIS — Z8659 Personal history of other mental and behavioral disorders: Secondary | ICD-10-CM | POA: Insufficient documentation

## 2012-05-09 DIAGNOSIS — Z8719 Personal history of other diseases of the digestive system: Secondary | ICD-10-CM | POA: Insufficient documentation

## 2012-05-09 DIAGNOSIS — J45909 Unspecified asthma, uncomplicated: Secondary | ICD-10-CM | POA: Insufficient documentation

## 2012-05-09 DIAGNOSIS — H6592 Unspecified nonsuppurative otitis media, left ear: Secondary | ICD-10-CM

## 2012-05-09 DIAGNOSIS — Z86718 Personal history of other venous thrombosis and embolism: Secondary | ICD-10-CM | POA: Insufficient documentation

## 2012-05-09 DIAGNOSIS — R209 Unspecified disturbances of skin sensation: Secondary | ICD-10-CM | POA: Insufficient documentation

## 2012-05-09 LAB — CBC WITH DIFFERENTIAL/PLATELET
Basophils Absolute: 0 10*3/uL (ref 0.0–0.1)
Basophils Relative: 0 % (ref 0–1)
Lymphocytes Relative: 26 % (ref 12–46)
Neutro Abs: 5.3 10*3/uL (ref 1.7–7.7)
Neutrophils Relative %: 65 % (ref 43–77)
Platelets: 226 10*3/uL (ref 150–400)
RDW: 13 % (ref 11.5–15.5)
WBC: 8.2 10*3/uL (ref 4.0–10.5)

## 2012-05-09 LAB — COMPREHENSIVE METABOLIC PANEL
ALT: 7 U/L (ref 0–35)
AST: 12 U/L (ref 0–37)
Albumin: 3.5 g/dL (ref 3.5–5.2)
CO2: 27 mEq/L (ref 19–32)
Chloride: 103 mEq/L (ref 96–112)
GFR calc non Af Amer: >90 mL/min (ref >90)
Sodium: 138 mEq/L (ref 135–145)
Total Bilirubin: 0.2 mg/dL — ABNORMAL LOW (ref 0.3–1.2)

## 2012-05-09 LAB — POCT PREGNANCY, URINE: Preg Test, Ur: NEGATIVE

## 2012-05-09 LAB — URINALYSIS, ROUTINE W REFLEX MICROSCOPIC
Bilirubin Urine: NEGATIVE
Glucose, UA: NEGATIVE mg/dL
Hgb urine dipstick: NEGATIVE
Specific Gravity, Urine: 1.007 (ref 1.005–1.030)
Urobilinogen, UA: 0.2 mg/dL (ref 0.0–1.0)

## 2012-05-09 MED ORDER — LORAZEPAM 0.5 MG PO TABS: 0.5000 mg | ORAL_TABLET | Freq: Three times a day (TID) | ORAL | Status: DC | PRN

## 2012-05-09 MED ORDER — PSEUDOEPHEDRINE HCL ER 120 MG PO TB12: 120.0000 mg | ORAL_TABLET | Freq: Two times a day (BID) | ORAL | Status: DC

## 2012-05-09 NOTE — ED Notes (Signed)
Pt states she has had dizziness for the past couple months and this morning she felt numbness to left arm and felt like her heart was racing. Pt states that she is concerned her brain tumor has returned.

## 2012-05-09 NOTE — ED Provider Notes (Signed)
History     CSN: [id number]  Arrival date & time 05/09/12  1145   First MD Initiated Contact with Patient 05/09/12 1237      Chief Complaint  Patient presents with  . Numbness  . Chest Pain     HPI  The patient presents with lightheadedness.  She gives contradictory report as to whether the lightheadedness is near syncopal or dizzy.  She states that she has had similar symptoms for"years"but that over the became more pronounced, persistent over the past 2 or 3 days she is concerned.  She denies true syncope, any falls, any lateral weakness, any confusion or disorientation. She has not been taking any medication for this. She states the symptoms are somewhat similar to those she experienced prior to meningioma resection several years ago. She denies current fevers, chills. She denies chest pain to me, this is contrary to the triage note. Denies visual complaints.   Past Medical History  Diagnosis Date  . TIA (transient ischemic attack)   . Seizures   . Brain tumor 2011  . Embolism - blood clot in left ovary  . Meningioma 08/14/2011    S/p craniotomy Encephaloma present non changing on MRI in 08/2011  . Anxiety   . Gallstones   . Asthma   . Clotting disorder   . Stroke   . Chills   . Unintentional weight loss   . Visual disturbance   . Palpitations   . Abdominal pain   . Rectal bleeding   . Nausea   . Rectal pain   . Blood in urine   . Weakness   . Confusion     Past Surgical History  Procedure Laterality Date  . Brain tumor removed  2011  . Ovarian mass removed  2010    left  . Cholecystectomy  12/03/2011    Procedure: LAPAROSCOPIC CHOLECYSTECTOMY WITH INTRAOPERATIVE CHOLANGIOGRAM;  Surgeon: Lodema Pilot, DO;  Location: [facility] OR;  Service: General;  Laterality: N/A;    Family History  Problem Relation Age of Onset  . Depression Mother   . Alcohol abuse Sister   . Alcohol abuse Brother   . Alzheimer's disease Maternal Grandmother   . Diabetes Maternal  Grandmother   . Stroke Maternal Grandmother   . Heart failure Father   . Hypertension Father   . Heart disease Father     History  Substance Use Topics  . Smoking status: Never Smoker   . Smokeless tobacco: Not on file  . Alcohol Use: No    OB History   Grav Para Term Preterm Abortions TAB SAB Ect Mult Living                  Review of Systems  Constitutional:       Per HPI, otherwise negative  HENT:       Per HPI, otherwise negative  Respiratory:       Per HPI, otherwise negative  Cardiovascular:       Per HPI, otherwise negative  Gastrointestinal: Negative for vomiting.  Endocrine:       Negative aside from HPI  Genitourinary:       Neg aside from HPI   Musculoskeletal:       Per HPI, otherwise negative  Skin: Negative.   Neurological: Positive for dizziness and light-headedness. Negative for syncope and headaches.    Allergies  Morphine and related  Home Medications   Current Outpatient Rx  Name  Route  Sig  Dispense  Refill  .  Multiple Vitamin (MULTIVITAMIN WITH MINERALS) TABS   Oral   Take 1 tablet by mouth daily.           BP 110/79  Pulse 73  Temp(Src) 98 F (36.7 C) (Oral)  Resp 16  SpO2 100%  Physical Exam  Nursing note and vitals reviewed. Constitutional: She is oriented to person, place, and time. She appears well-developed and well-nourished. No distress.  HENT:  Head: Normocephalic and atraumatic.  Sclerotic right hepatic membrane, both tympanic membranes are without injection.  Left tympanic membrane has slight effusion present  Eyes: Conjunctivae and EOM are normal.  Cardiovascular: Normal rate and regular rhythm.   Pulmonary/Chest: Effort normal and breath sounds normal. No stridor. No respiratory distress.  Abdominal: She exhibits no distension.  Musculoskeletal: She exhibits no edema.  Neurological: She is alert and oriented to person, place, and time. She displays no atrophy and no tremor. No cranial nerve deficit. She  exhibits normal muscle tone. She displays no seizure activity. Coordination normal.  Skin: Skin is warm and dry.  Psychiatric: She has a normal mood and affect.    ED Course  Procedures (including critical care time)  Labs Reviewed  CBC WITH DIFFERENTIAL  COMPREHENSIVE METABOLIC PANEL  URINALYSIS, ROUTINE W REFLEX MICROSCOPIC   No results found.   No diagnosis found.  Cardiac: 70, sr, nml  O2- 99%ra, nml   Date: 05/09/2012  Rate: 69  Rhythm: normal sinus rhythm  QRS Axis: normal  Intervals: normal  ST/T Wave abnormalities: normal  Conduction Disutrbances: none  Narrative Interpretation: unremarkable    Labs reviewed, all within normal limits  MDM  This generally well-appearing young female presents with concern of ongoing dizziness and lightheadedness.  The patient's inability to differentiate between these two, and the absence of true syncope with normal VS, and reassuring labs / ecg is reassuring for the low suspicion of an acute intracranial or neurologic process.   CT not performed due to these factors, and the absence of neuro findings. With the presence of a L ear effusion, her presentation may be 2/2 vestibulocochlear disruption.  She has a neurologist, and was d/c in stable condition to f/u with him and her PMD.        Gerhard Munch, MD 05/09/12 1450

## 2012-05-09 NOTE — ED Notes (Signed)
MD at bedside. 

## 2012-05-09 NOTE — ED Notes (Addendum)
Per EMS pt came from home c/o numbness to left arm, feeling increased HR, dizziness. Pt was concerned she was having a panic attack but symptoms did not subside. CBG 108. En route to hospital pt c/o chest tightness and eye pain. No SOB.

## 2012-05-09 NOTE — ED Notes (Signed)
POCT Pregnancy =  Negative (-) 

## 2012-05-09 NOTE — ED Notes (Signed)
Pt ambulated with steady gait to bathroom for obtaining urine sample. Pt instructed to notify RN if she needs assistance.

## 2012-08-18 ENCOUNTER — Emergency Department (HOSPITAL_COMMUNITY)
Admission: EM | Admit: 2012-08-18 | Discharge: 2012-08-18 | Disposition: A | Discharge status: Home / Self Care | Payer: Medicare Other | Attending: Emergency Medicine | Admitting: Emergency Medicine

## 2012-08-18 ENCOUNTER — Encounter (HOSPITAL_COMMUNITY): Payer: Self-pay | Admitting: Emergency Medicine

## 2012-08-18 DIAGNOSIS — Z79899 Other long term (current) drug therapy: Secondary | ICD-10-CM | POA: Insufficient documentation

## 2012-08-18 DIAGNOSIS — Z8659 Personal history of other mental and behavioral disorders: Secondary | ICD-10-CM | POA: Insufficient documentation

## 2012-08-18 DIAGNOSIS — Z862 Personal history of diseases of the blood and blood-forming organs and certain disorders involving the immune mechanism: Secondary | ICD-10-CM | POA: Insufficient documentation

## 2012-08-18 DIAGNOSIS — H538 Other visual disturbances: Secondary | ICD-10-CM | POA: Insufficient documentation

## 2012-08-18 DIAGNOSIS — Z3202 Encounter for pregnancy test, result negative: Secondary | ICD-10-CM | POA: Insufficient documentation

## 2012-08-18 DIAGNOSIS — Z8673 Personal history of transient ischemic attack (TIA), and cerebral infarction without residual deficits: Secondary | ICD-10-CM | POA: Insufficient documentation

## 2012-08-18 DIAGNOSIS — Z8669 Personal history of other diseases of the nervous system and sense organs: Secondary | ICD-10-CM | POA: Insufficient documentation

## 2012-08-18 DIAGNOSIS — R42 Dizziness and giddiness: Secondary | ICD-10-CM

## 2012-08-18 DIAGNOSIS — J45909 Unspecified asthma, uncomplicated: Secondary | ICD-10-CM | POA: Insufficient documentation

## 2012-08-18 DIAGNOSIS — Z8719 Personal history of other diseases of the digestive system: Secondary | ICD-10-CM | POA: Insufficient documentation

## 2012-08-18 DIAGNOSIS — Z86711 Personal history of pulmonary embolism: Secondary | ICD-10-CM | POA: Insufficient documentation

## 2012-08-18 DIAGNOSIS — Z8679 Personal history of other diseases of the circulatory system: Secondary | ICD-10-CM | POA: Insufficient documentation

## 2012-08-18 LAB — POCT PREGNANCY, URINE: Preg Test, Ur: NEGATIVE

## 2012-08-18 LAB — CBC
HCT: 40 % (ref 36.0–46.0)
Hemoglobin: 13.5 g/dL (ref 12.0–15.0)
MCV: 86.8 fL (ref 78.0–100.0)
RDW: 12.7 % (ref 11.5–15.5)
WBC: 4.7 10*3/uL (ref 4.0–10.5)

## 2012-08-18 LAB — POCT I-STAT, CHEM 8
BUN: 9 mg/dL (ref 6–23)
Chloride: 104 mEq/L (ref 96–112)
Glucose, Bld: 82 mg/dL (ref 70–99)
HCT: 40 % (ref 36.0–46.0)
Potassium: 3.9 mEq/L (ref 3.5–5.1)

## 2012-08-18 NOTE — ED Notes (Signed)
Pt c/o generalized weakness, blurry vision and dizziness x months per pt; pt sts had appointment this am but feeling bad so came here instread

## 2012-08-18 NOTE — ED Notes (Signed)
Check patient vision she was 20/30 in both eyes, right eye 20/40. Left eye 20/40

## 2012-08-18 NOTE — ED Notes (Signed)
Dr. Gwendolyn Grant back in to speak with patient.

## 2012-08-18 NOTE — ED Provider Notes (Signed)
History     CSN: [id number]  Arrival date & time 08/18/12  5784   First MD Initiated Contact with Patient 08/18/12 [phone]      Chief Complaint  Patient presents with  . Fatigue  . Blurred Vision    (Consider location/radiation/quality/duration/timing/severity/associated sxs/prior treatment) HPI Comments: Patient is complaining of dizziness that got worse over the past few days. She has chronic dizziness every day, however it worsened since she has started her menstrual cycle 2 days ago. She's been seen multiple times in the ED for dizziness, sometimes it is vertiginous, sometimes with more lightheadedness. She denies any syncope or near-syncope today. No falls. No trauma. She does have a history of a meningioma resection in 2011. She is followed by [facility] here and was going to her appointment, however while she was driving she felt she couldn't focus and felt the dizziness more so she came to emergency department.  Patient is a 37 y.o. female presenting with neurologic complaint.  Neurologic Problem This is a chronic problem. The current episode started today. The problem occurs constantly. The problem has been gradually worsening (worse when she is on her cycle, started her period 2 days ago). Pertinent negatives include no abdominal pain, chest pain, chills, congestion, coughing, nausea, neck pain, numbness, vertigo, vomiting or weakness. Nothing aggravates the symptoms. She has tried nothing for the symptoms.    Past Medical History  Diagnosis Date  . TIA (transient ischemic attack)   . Seizures   . Brain tumor 2011  . Embolism - blood clot in left ovary  . Meningioma 08/14/2011    S/p craniotomy Encephaloma present non changing on MRI in 08/2011  . Anxiety   . Gallstones   . Asthma   . Clotting disorder   . Stroke   . Chills   . Unintentional weight loss   . Visual disturbance   . Palpitations   . Abdominal pain   . Rectal bleeding   . Nausea   . Rectal pain   .  Blood in urine   . Weakness   . Confusion     Past Surgical History  Procedure Laterality Date  . Brain tumor removed  2011  . Ovarian mass removed  2010    left  . Cholecystectomy  12/03/2011    Procedure: LAPAROSCOPIC CHOLECYSTECTOMY WITH INTRAOPERATIVE CHOLANGIOGRAM;  Surgeon: Lodema Pilot, DO;  Location: [facility] OR;  Service: General;  Laterality: N/A;    Family History  Problem Relation Age of Onset  . Depression Mother   . Alcohol abuse Sister   . Alcohol abuse Brother   . Alzheimer's disease Maternal Grandmother   . Diabetes Maternal Grandmother   . Stroke Maternal Grandmother   . Heart failure Father   . Hypertension Father   . Heart disease Father     History  Substance Use Topics  . Smoking status: Never Smoker   . Smokeless tobacco: Not on file  . Alcohol Use: No    OB History   Grav Para Term Preterm Abortions TAB SAB Ect Mult Living                  Review of Systems  Constitutional: Negative for chills.  HENT: Negative for congestion and neck pain.   Respiratory: Negative for cough.   Cardiovascular: Negative for chest pain.  Gastrointestinal: Negative for nausea, vomiting and abdominal pain.  Neurological: Negative for vertigo, weakness and numbness.  All other systems reviewed and are negative.    Allergies  Morphine and related  Home Medications   Current Outpatient Rx  Name  Route  Sig  Dispense  Refill  . Ascorbic Acid (VITAMIN C GUMMIE PO)   Oral   Take 1 tablet by mouth daily.         . calcium citrate-vitamin D (CITRACAL+D) 315-200 MG-UNIT per tablet   Oral   Take 2 tablets by mouth daily.         . Cholecalciferol (VITAMIN D) 2000 UNITS tablet   Oral   Take 2,000 Units by mouth daily.         . Multiple Vitamin (MULTIVITAMIN WITH MINERALS) TABS   Oral   Take 1 tablet by mouth daily.           BP 132/65  Pulse 80  Temp(Src) 98.5 F (36.9 C) (Oral)  Resp 14  SpO2 100%  Physical Exam  Nursing note and vitals  reviewed. Constitutional: She is oriented to person, place, and time. She appears well-developed and well-nourished. No distress.  HENT:  Head: Normocephalic and atraumatic.  Eyes: EOM are normal. Pupils are equal, round, and reactive to light.  Neck: Normal range of motion. Neck supple.  Cardiovascular: Normal rate and regular rhythm.  Exam reveals no friction rub.   No murmur heard. Pulmonary/Chest: Effort normal and breath sounds normal. No respiratory distress. She has no wheezes. She has no rales.  Abdominal: Soft. She exhibits no distension. There is no tenderness. There is no rebound.  Musculoskeletal: Normal range of motion. She exhibits no edema.  Neurological: She is alert and oriented to person, place, and time. No cranial nerve deficit. She exhibits normal muscle tone. Coordination normal.  Normal gait  Skin: She is not diaphoretic.    ED Course  Procedures (including critical care time)  Labs Reviewed  POCT I-STAT, CHEM 8 - Abnormal; Notable for the following:    Calcium, Ion 1.24 (*)    All other components within normal limits  CBC  POCT PREGNANCY, URINE   No results found.   1. Dizziness       MDM    37 year old female with history of meningioma previously presents with dizziness. She feels like it is gotten worse over the past few days secondary to her menstrual cycle. She has chronic dizziness every day. She's been seen multiple times here in the family physicians for her dizziness. She denies any syncope, near-syncope, fever, or recent trauma. She reports some chronic diarrhea since her gallbladder removal. She denies any heavy periods or rectal bleeding. She states history of anxiety that has been intractable to Ativan and other anxiety meds. Exam is normal here. She is normal neuro exam was normal gait, normal cranial nerves, normal sensation, normal cerebellar functions. I spoke with family medicine, she was supposed to see today, and they can get her in  this afternoon. I will check some basic labs, I do not feel she needs an emergent Head CT or MRI today.  Labs are normal, she is stable for discharge. She is comfortable with goingto see Family Medicine and understands she does not need emergent head imaging. She was given Family Medicine clinic number and stated she will f/u within a few hours.   Dagmar Hait, MD 08/18/12 [phone]

## 2012-08-21 NOTE — ED Provider Notes (Deleted)
Medical screening examination/treatment/procedure(s) were performed by non-physician practitioner and as supervising physician I was immediately available for consultation/collaboration.  Colena Ketterman R. Tyneisha Hegeman, MD 08/21/12 2352 

## 2012-09-03 NOTE — ED Provider Notes (Signed)
I saw and evaluated the patient, reviewed the resident's note and I agree with the findings and plan. Patient with dizziness. Previous workup for same. Benign exam. Will have followup with PCP today  Juliet Rude. Rubin Payor, MD 09/03/12 [phone]

## 2012-11-09 IMAGING — US US ABDOMEN COMPLETE
1 series · 14 of 25 positions shown · non-contrast
Comparison: 10/25/2011.

CLINICAL DATA: Nausea.  Clinical concern for cholecystitis.

COMPLETE ABDOMINAL ULTRASOUND

[Series 1: us abdomen complete · 0.15mm/px · 14 of 56 slices shown]
[im 1/56]
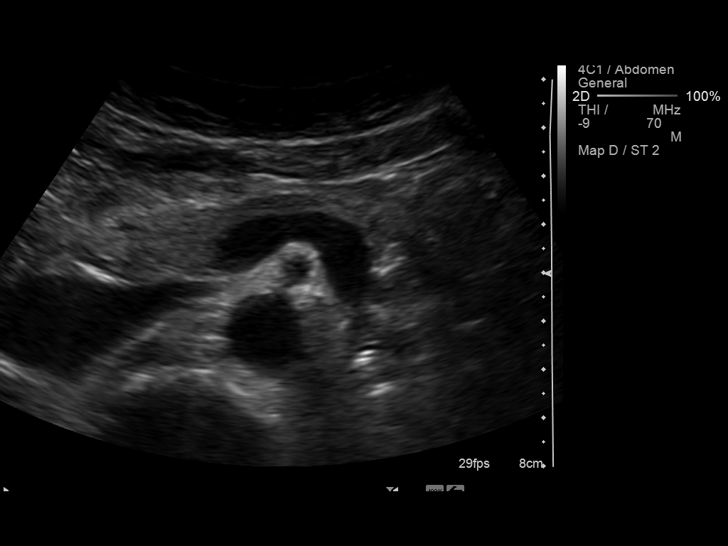
[im 5/56]
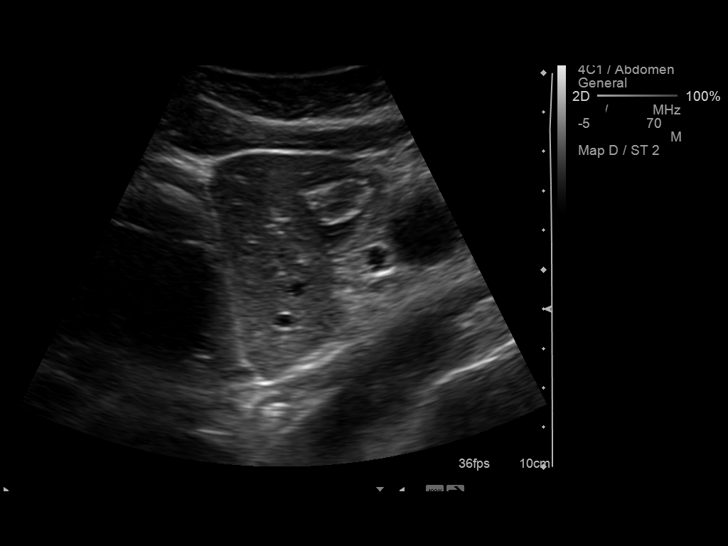
[im 10/56]
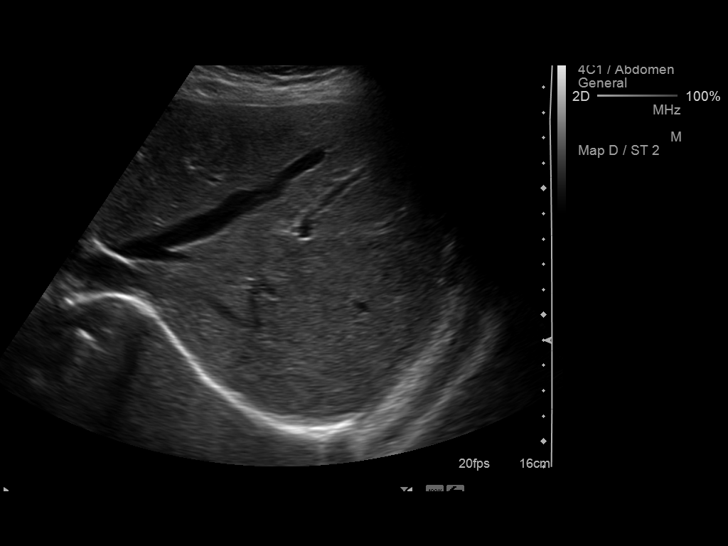
[im 14/56]
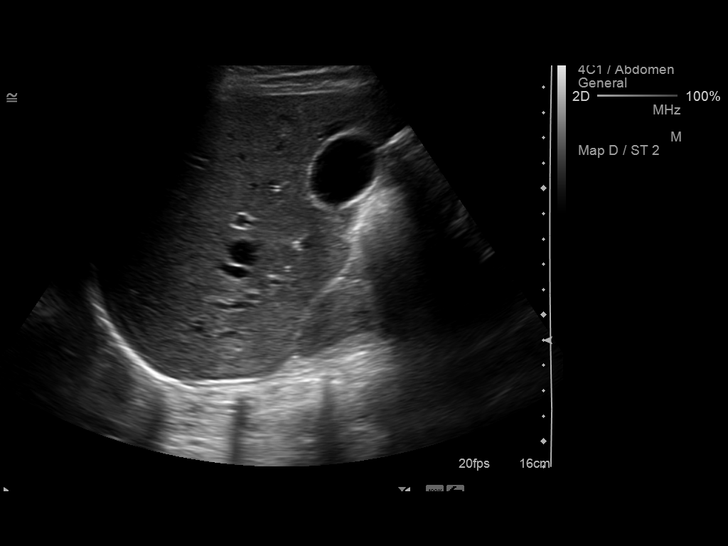
[im 19/56]
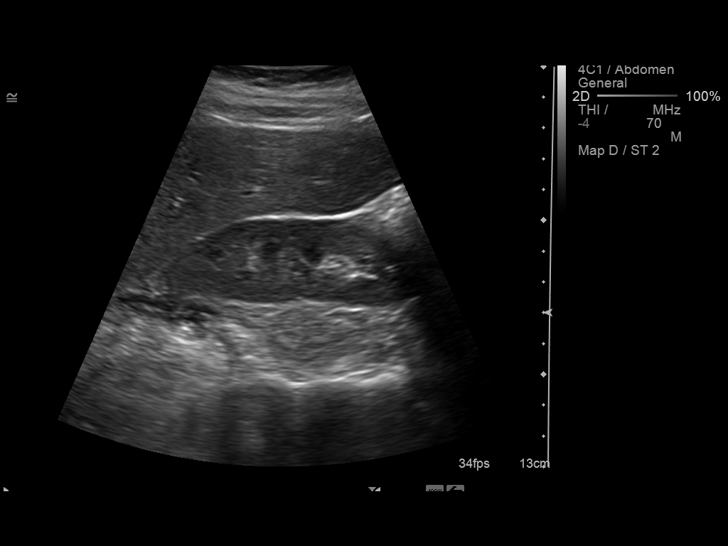
[im 21/56]
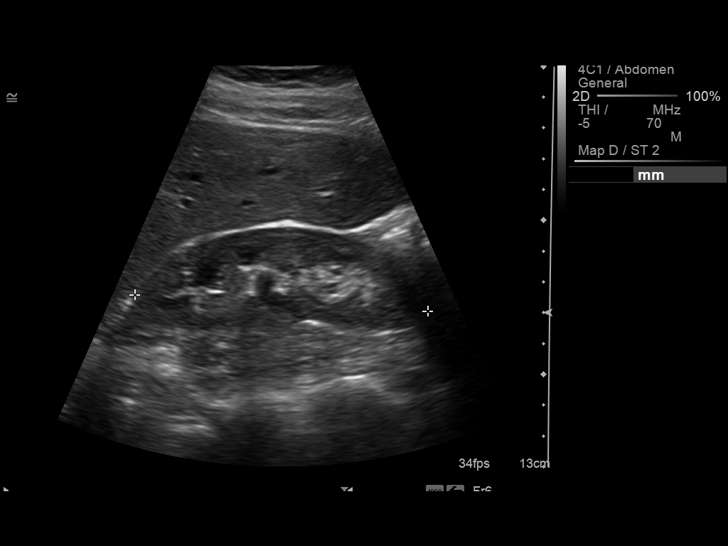
[im 26/56]
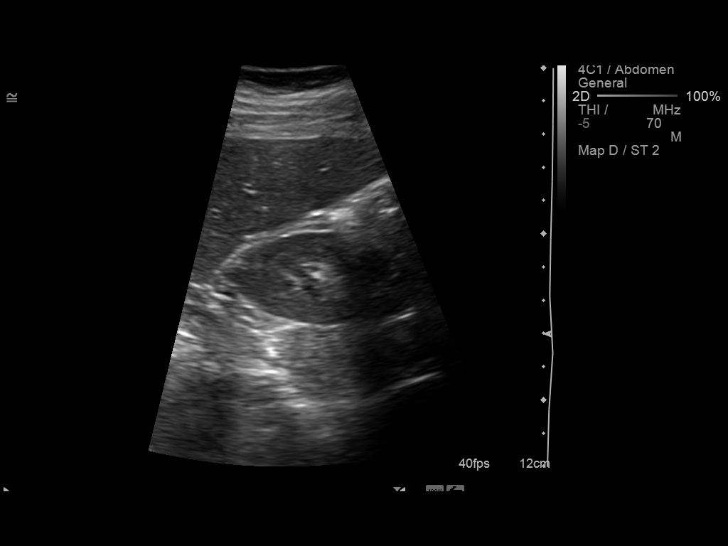
[im 30/56]
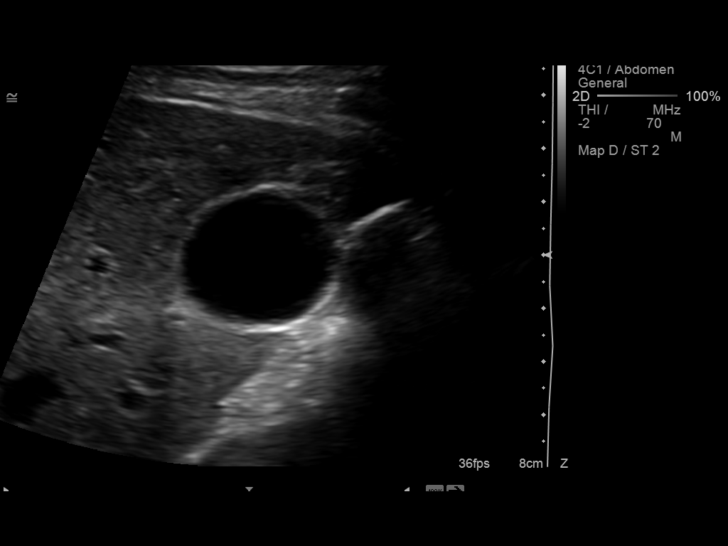
[im 35/56]
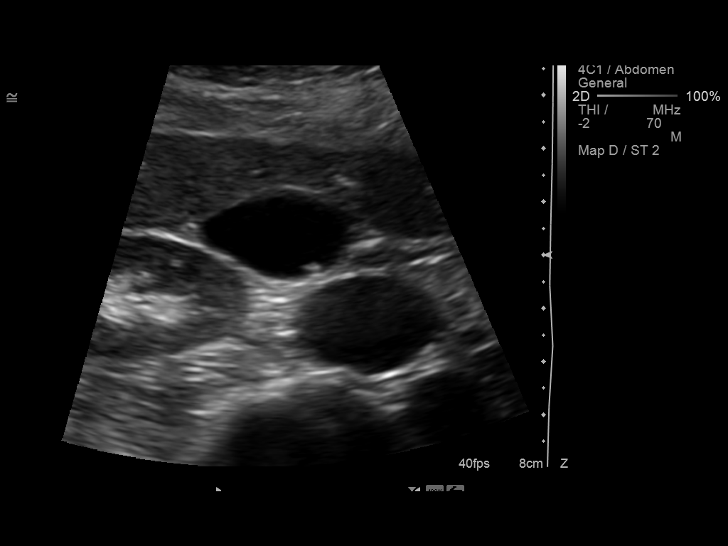
[im 37/56]
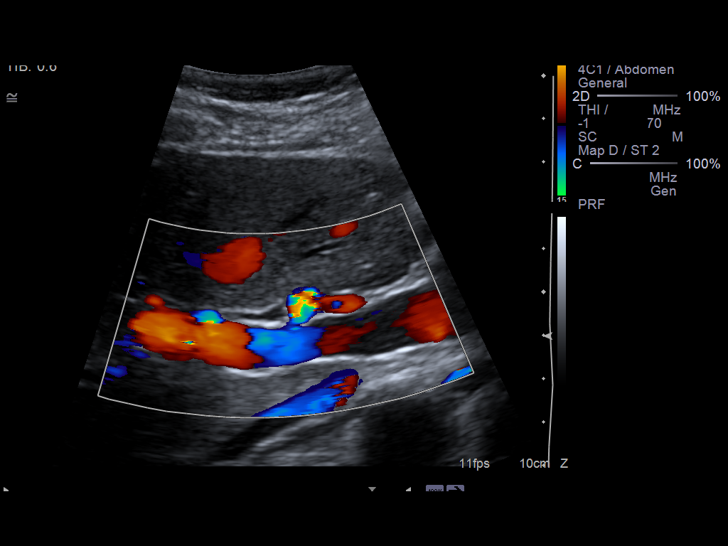
[im 42/56]
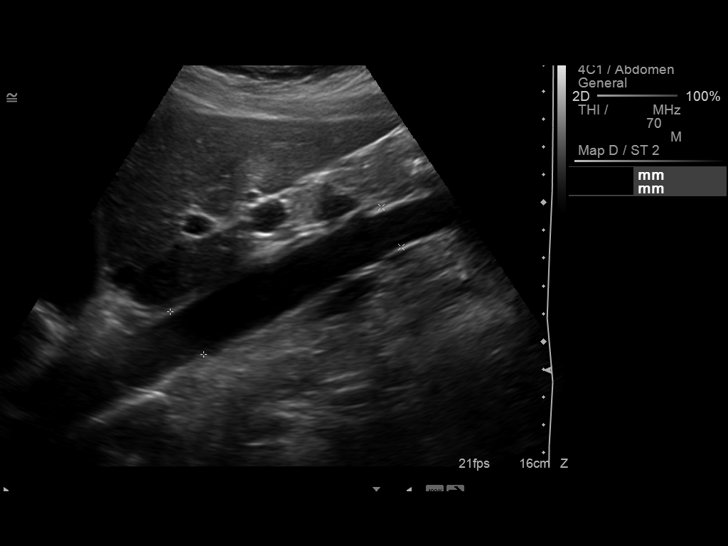
[im 46/56]
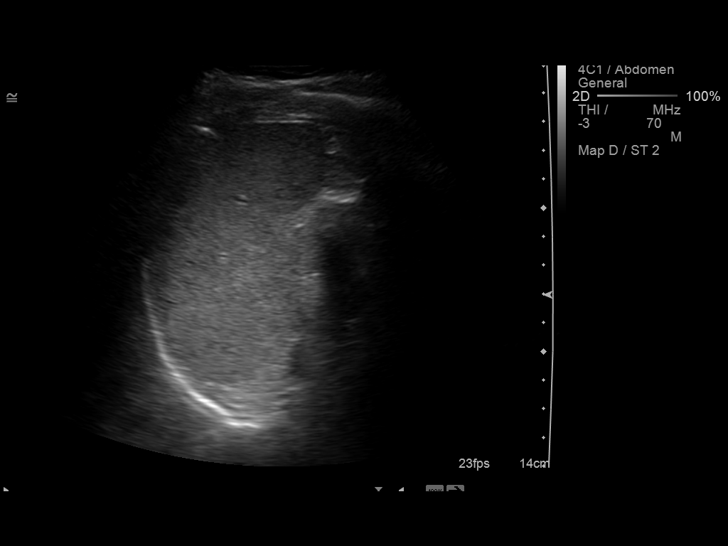
[im 51/56]
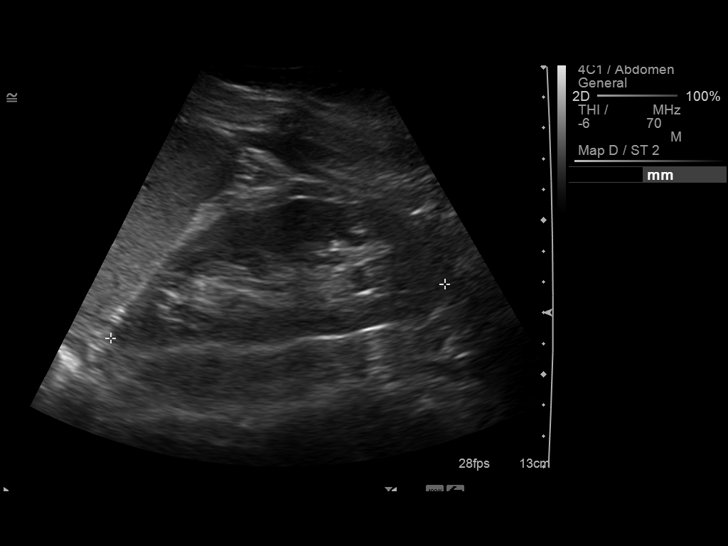
[im 56/56]
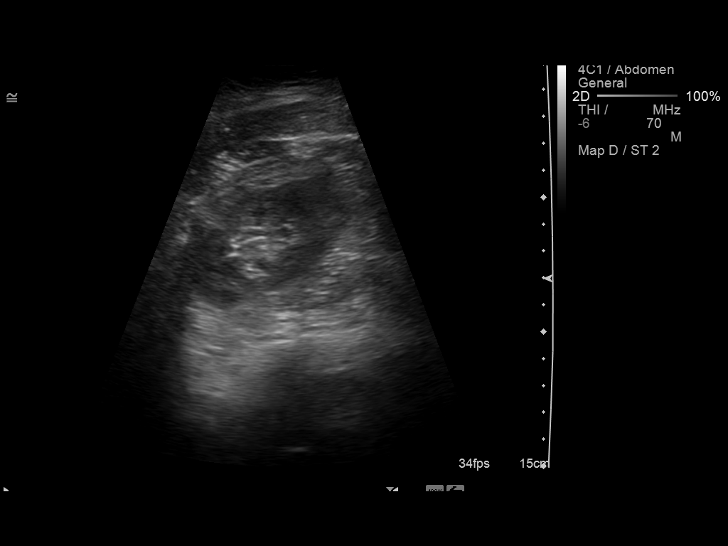

[14 of 25 positions shown; findings below may reference images not displayed]

FINDINGS: Gallbladder:  Interval visualization of two small, mobile
gallstones.  One measures 5 mm in maximum diameter and the other
measures 2 mm in maximum diameter.  No gallbladder wall thickening
or pericholecystic fluid.  The patient was focally tender over the
gallbladder today.

Common bile duct:  Normal, measuring 3.2 mm in diameter proximally.

Liver:  Normal.

IVC:  Normal proximally.

Pancreas:  Normal

Spleen:  Normal, measuring 6.1 cm in length.

Right Kidney:  Normal, measuring 9.5 cm in length.

Left Kidney:  Normal, measuring 11.0 cm in length.

Abdominal aorta:  Normal.
IMPRESSION: Interval visualization of two small gallstones in the gallbladder.
The patient is also focally tender over the gallbladder.
Otherwise, normal examination.

## 2012-12-02 IMAGING — CR DG ABDOMEN ACUTE W/ 1V CHEST
3 series · 3 of 3 positions shown · non-contrast
Comparison: None.

CLINICAL DATA: Abdomen pain and nausea

ACUTE ABDOMEN SERIES (ABDOMEN 2 VIEW & CHEST 1 VIEW)

[w chest pa]
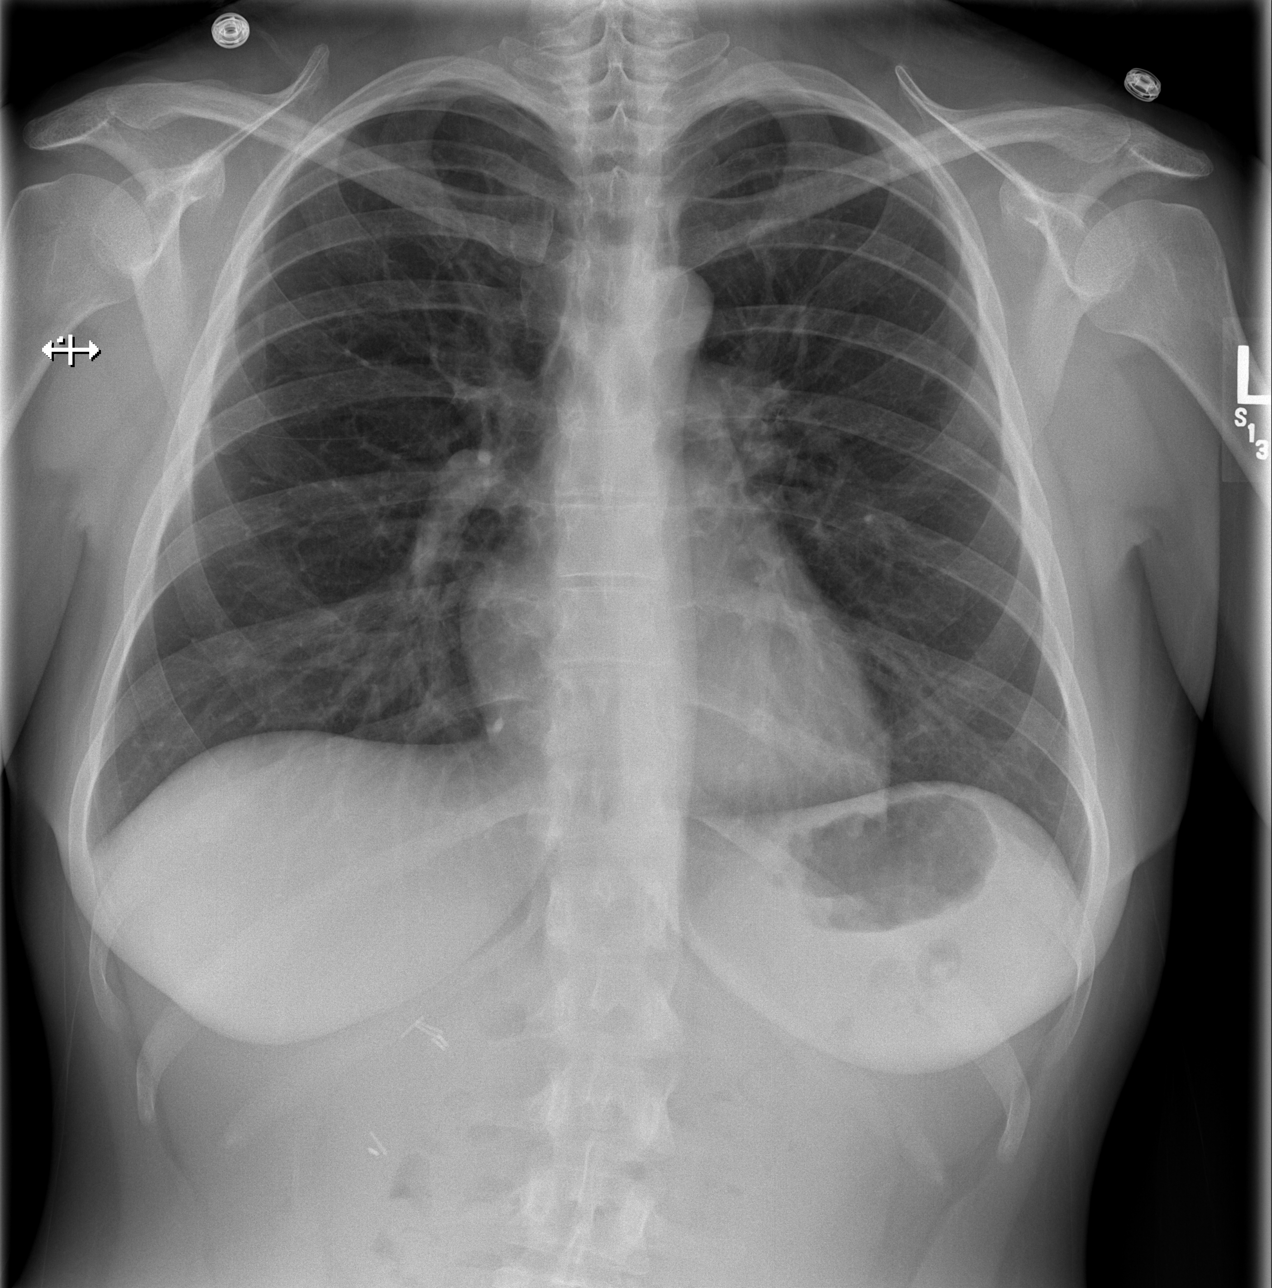

[w abdomen upright]
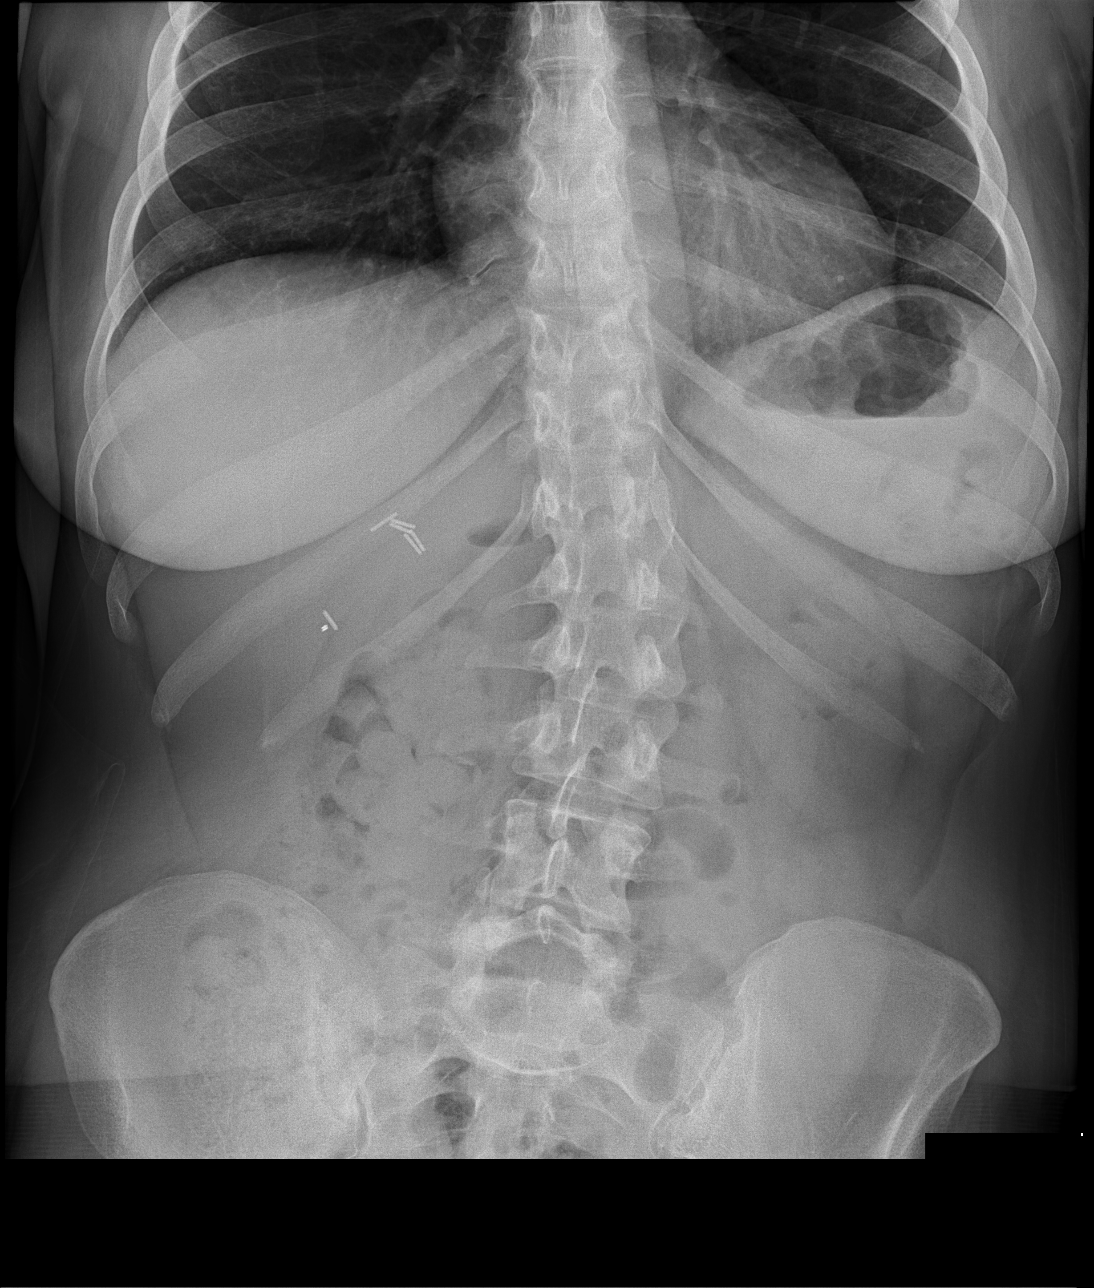

[t abdomen supine]
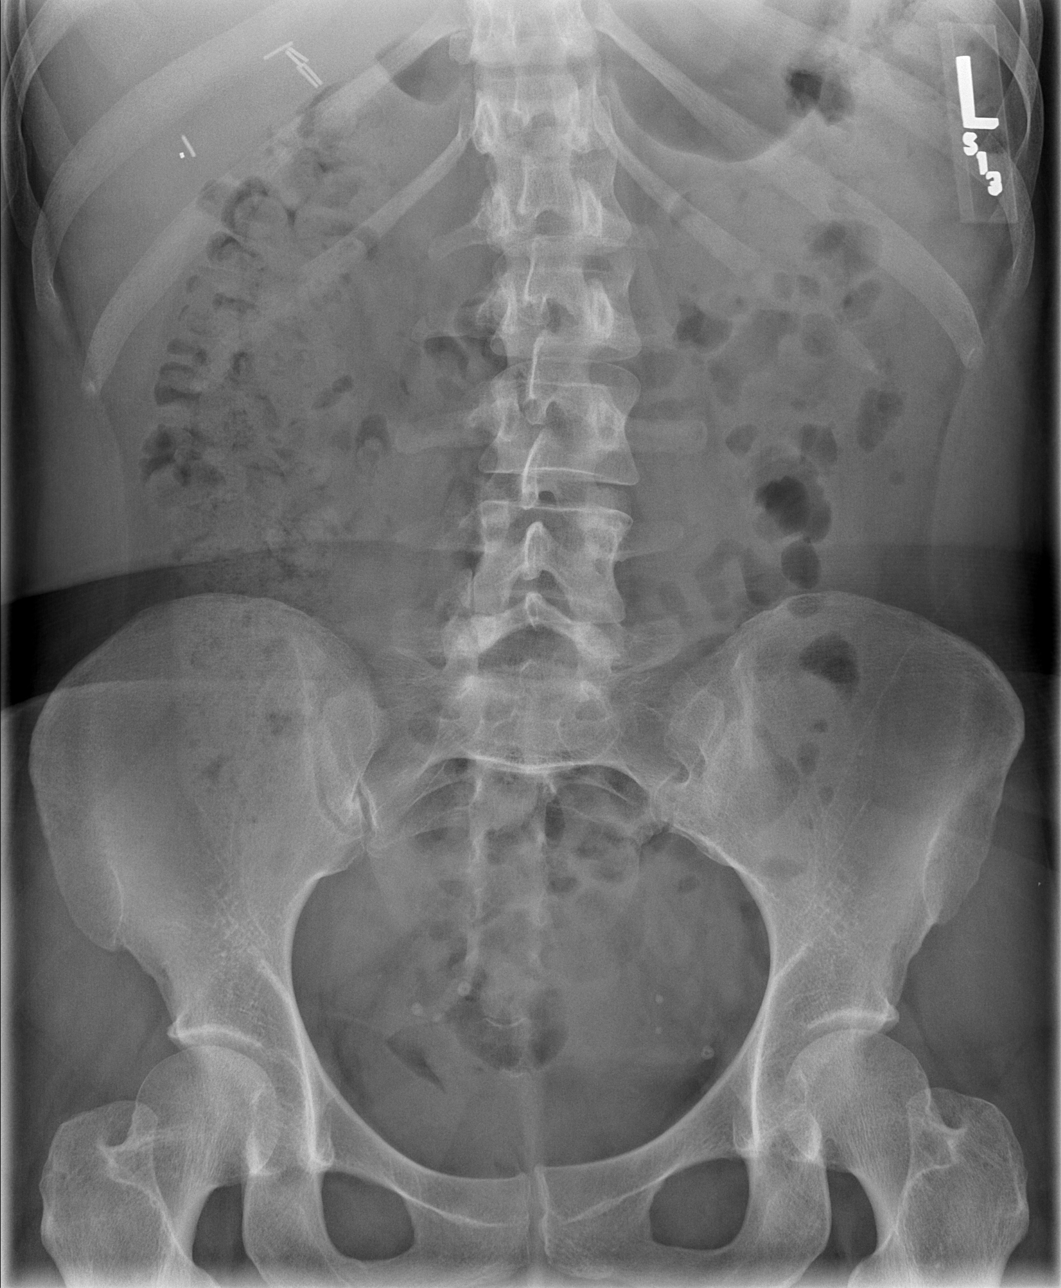

[3 of 3 positions shown; findings below may reference images not displayed]

FINDINGS: PA chest:  Lungs clear.  Heart size and pulmonary vascularity are
normal.  No adenopathy.

Supine and upright abdomen:  There is moderate stool in the colon.
The bowel gas pattern is normal.  No obstruction or free air.

There are surgical clips in the gallbladder fossa region.  There
are phleboliths in the pelvis.  There is lumbar levoscoliosis.
IMPRESSION: Lungs clear.  Nonspecific gas pattern.

## 2012-12-14 IMAGING — US US PELVIS COMPLETE
1 series · 13 of 25 positions shown · non-contrast
Comparison: CT on 03/09/2008

CLINICAL DATA: Pelvic and rectal pain.  Right ovarian mass seen
during recent laparoscopic cholecystectomy.



[Series 1: us pelvis complete · 66 acquisitions, 13 frames shown]
[im 1/66]
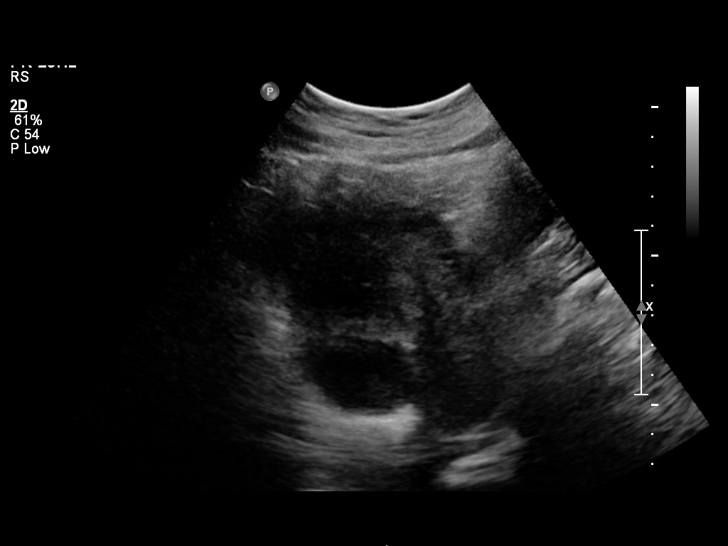
[im 6/66]
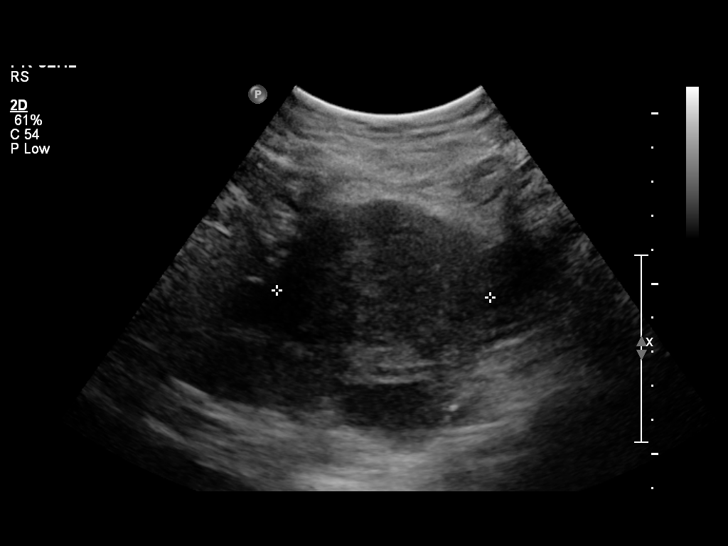
[im 11/66]
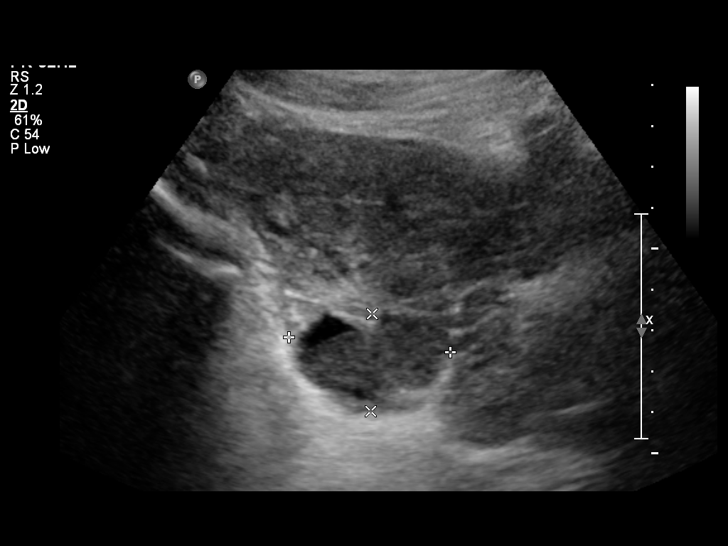
[im 17/66]
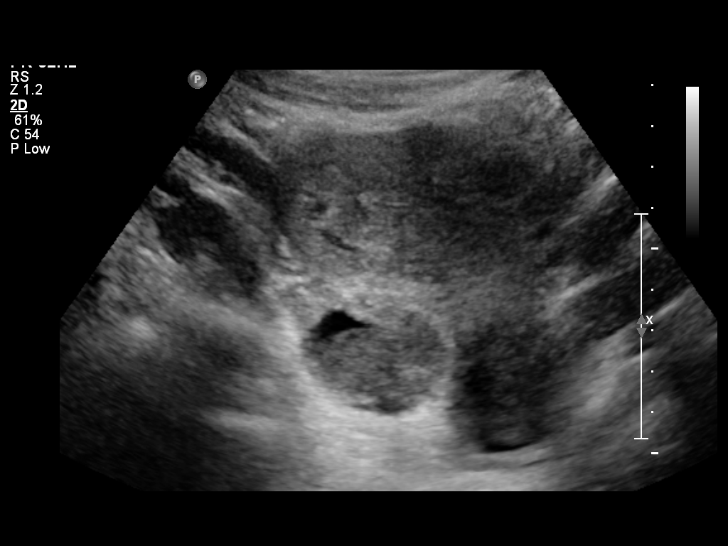
[im 22/66]
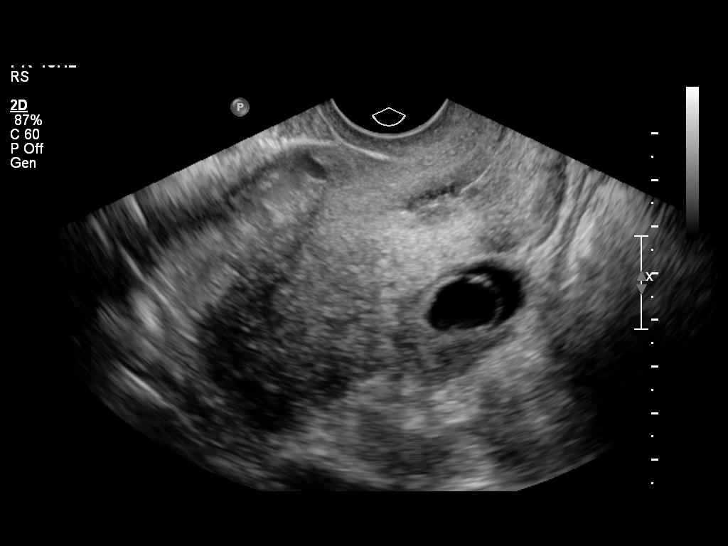
[im 28/66]
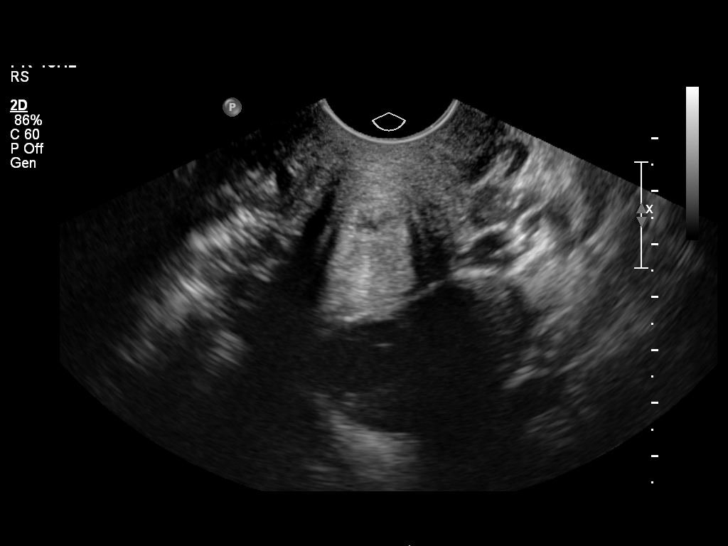
[im 33/66]
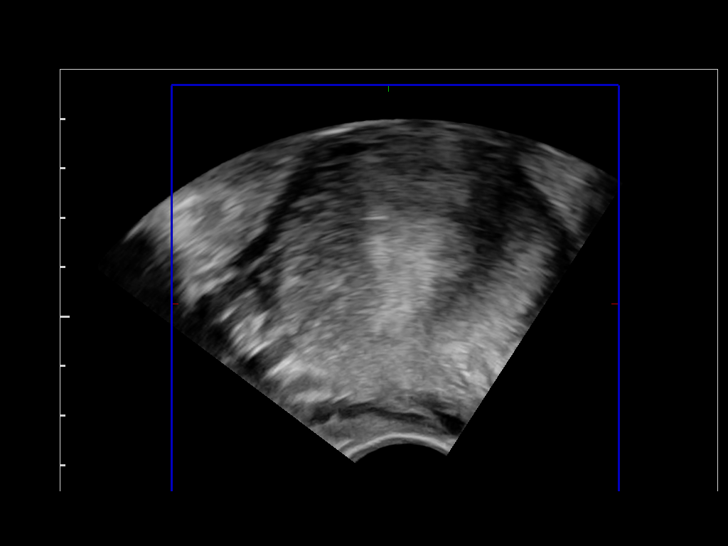
[im 38/66]
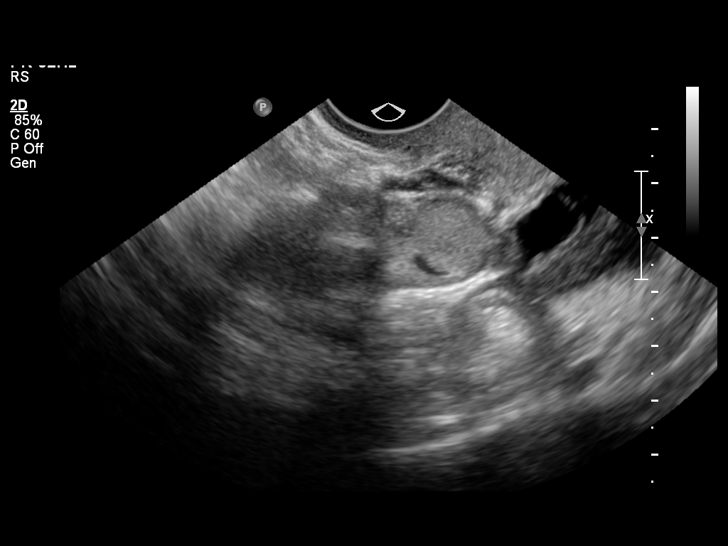
[im 44/66]
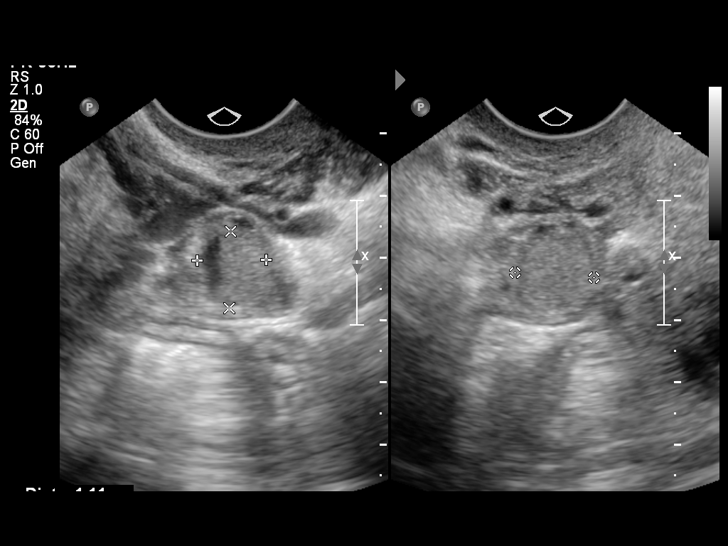
[im 49/66]
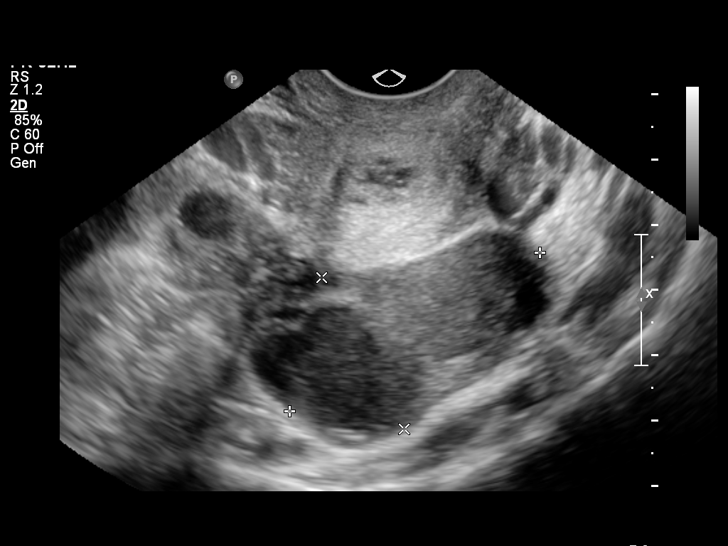
[im 55/66]
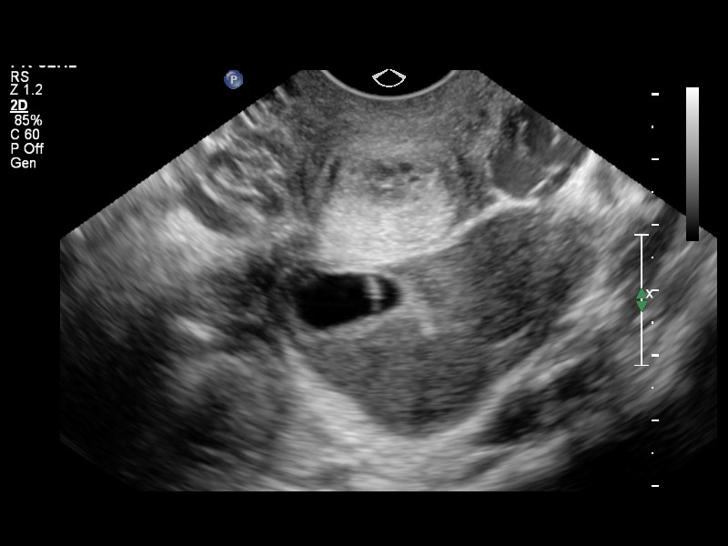
[im 60/66]
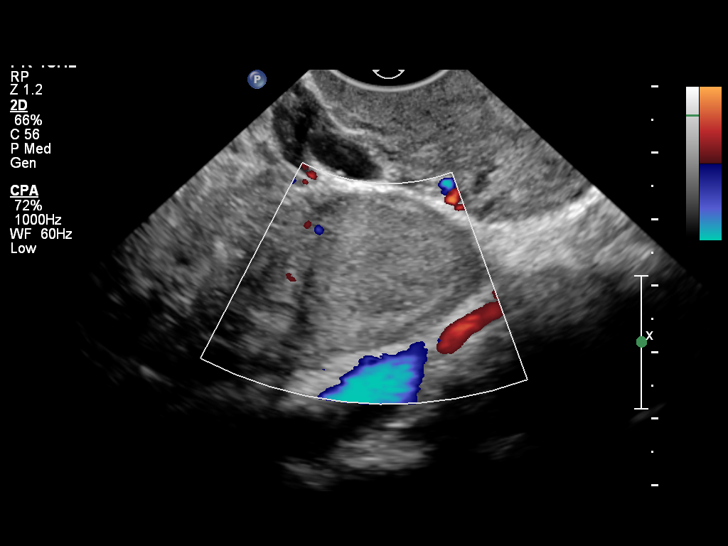
[im 66/66]
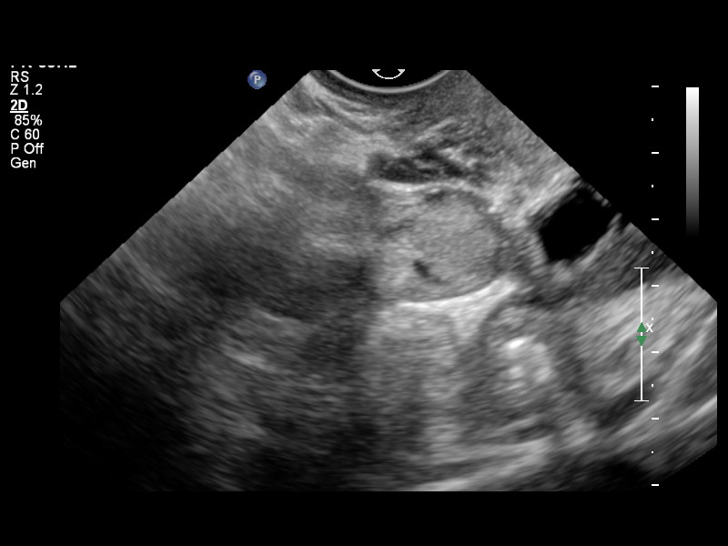

[13 of 25 positions shown; findings below may reference images not displayed]

FINDINGS: Uterus:  9.5 x 5.6 x 6.2 cm.  No fibroids or other uterine mass
identified.

Endometrium: Double layer endometrial thickness measures 9 mm
transvaginally.  No focal lesion visualized.

Right ovary: 4.4 x 1.7 x 1.9 cm.  A complex cyst is seen which
measures 1.3 cm and appears to contain a "hematocrit" level on at
least one image.  No internal blood flow is seen within the lesion
on color Doppler ultrasound.  This is suspicious for a hemorrhagic
cyst or small endometrioma.

Left ovary: A complex cystic lesion is seen in the left adnexa
which contains two homogeneous hypoechoic areas each measuring
approximately 2 cm, which have characteristics suspicious for
endometriomas.  A third 2 cm cystic area contains some internal
strands or septations.  No internal blood flow is seen within this
lesion, and this could represent blood clot as well.

Other Findings:  No free fluid
IMPRESSION: 1.  Bilateral indeterminate complex ovarian or adnexal lesions,
which have probably benign characteristics and are suspicious for
endometriomas.  Recommend follow-up by transvaginal ultrasound in 6-
12 weeks.
2.  Normal appearance of uterus.  No fibroids identified.

This recommendation follows the consensus statement:  Management of
Asymptomatic Ovarian and Other Adnexal Cysts Imaged at US:  Society
of Radiologists in Ultrasound Consensus Conference Statement.

## 2013-04-11 ENCOUNTER — Emergency Department (HOSPITAL_COMMUNITY): Payer: Medicare Other

## 2013-04-11 ENCOUNTER — Emergency Department (HOSPITAL_COMMUNITY)
Admission: EM | Admit: 2013-04-11 | Discharge: 2013-04-12 | Disposition: A | Discharge status: Home / Self Care | Payer: Medicare Other | Attending: Emergency Medicine | Admitting: Emergency Medicine

## 2013-04-11 ENCOUNTER — Encounter (HOSPITAL_COMMUNITY): Payer: Self-pay | Admitting: Emergency Medicine

## 2013-04-11 DIAGNOSIS — H538 Other visual disturbances: Secondary | ICD-10-CM | POA: Insufficient documentation

## 2013-04-11 DIAGNOSIS — Z86718 Personal history of other venous thrombosis and embolism: Secondary | ICD-10-CM | POA: Insufficient documentation

## 2013-04-11 DIAGNOSIS — Z8659 Personal history of other mental and behavioral disorders: Secondary | ICD-10-CM | POA: Insufficient documentation

## 2013-04-11 DIAGNOSIS — Z86011 Personal history of benign neoplasm of the brain: Secondary | ICD-10-CM | POA: Insufficient documentation

## 2013-04-11 DIAGNOSIS — Z79899 Other long term (current) drug therapy: Secondary | ICD-10-CM | POA: Insufficient documentation

## 2013-04-11 DIAGNOSIS — R42 Dizziness and giddiness: Secondary | ICD-10-CM

## 2013-04-11 DIAGNOSIS — J45909 Unspecified asthma, uncomplicated: Secondary | ICD-10-CM | POA: Insufficient documentation

## 2013-04-11 DIAGNOSIS — Z8673 Personal history of transient ischemic attack (TIA), and cerebral infarction without residual deficits: Secondary | ICD-10-CM | POA: Insufficient documentation

## 2013-04-11 DIAGNOSIS — Z8719 Personal history of other diseases of the digestive system: Secondary | ICD-10-CM | POA: Insufficient documentation

## 2013-04-11 DIAGNOSIS — Z3202 Encounter for pregnancy test, result negative: Secondary | ICD-10-CM | POA: Insufficient documentation

## 2013-04-11 LAB — COMPREHENSIVE METABOLIC PANEL
ALBUMIN: 3.6 g/dL (ref 3.5–5.2)
ALT: 9 U/L (ref 0–35)
AST: 14 U/L (ref 0–37)
Alkaline Phosphatase: 62 U/L (ref 39–117)
BUN: 13 mg/dL (ref 6–23)
CALCIUM: 9.1 mg/dL (ref 8.4–10.5)
CHLORIDE: 101 meq/L (ref 96–112)
CO2: 25 mEq/L (ref 19–32)
CREATININE: 0.76 mg/dL (ref 0.50–1.10)
GFR calc Af Amer: >90 mL/min (ref >90)
Glucose, Bld: 104 mg/dL — ABNORMAL HIGH (ref 70–99)
Potassium: 4.1 mEq/L (ref 3.7–5.3)
Sodium: 139 mEq/L (ref 137–147)
Total Protein: 7.7 g/dL (ref 6.0–8.3)

## 2013-04-11 LAB — URINALYSIS, ROUTINE W REFLEX MICROSCOPIC
BILIRUBIN URINE: NEGATIVE
Glucose, UA: NEGATIVE mg/dL
Hgb urine dipstick: NEGATIVE
KETONES UR: NEGATIVE mg/dL
LEUKOCYTES UA: NEGATIVE
NITRITE: NEGATIVE
PROTEIN: NEGATIVE mg/dL
Specific Gravity, Urine: 1.022 (ref 1.005–1.030)
UROBILINOGEN UA: 0.2 mg/dL (ref 0.0–1.0)
pH: 5.5 (ref 5.0–8.0)

## 2013-04-11 LAB — CBC WITH DIFFERENTIAL/PLATELET
BASOS ABS: 0 10*3/uL (ref 0.0–0.1)
BASOS PCT: 0 % (ref 0–1)
EOS ABS: 0.2 10*3/uL (ref 0.0–0.7)
EOS PCT: 2 % (ref 0–5)
HEMATOCRIT: 40.1 % (ref 36.0–46.0)
Hemoglobin: 14 g/dL (ref 12.0–15.0)
LYMPHS PCT: 30 % (ref 12–46)
Lymphs Abs: 2.3 10*3/uL (ref 0.7–4.0)
MCH: 30 pg (ref 26.0–34.0)
MCHC: 34.9 g/dL (ref 30.0–36.0)
MCV: 86.1 fL (ref 78.0–100.0)
MONO ABS: 0.5 10*3/uL (ref 0.1–1.0)
Monocytes Relative: 6 % (ref 3–12)
Neutro Abs: 4.7 10*3/uL (ref 1.7–7.7)
Neutrophils Relative %: 61 % (ref 43–77)
PLATELETS: 276 10*3/uL (ref 150–400)
RBC: 4.66 MIL/uL (ref 3.87–5.11)
RDW: 12.6 % (ref 11.5–15.5)
WBC: 7.7 10*3/uL (ref 4.0–10.5)

## 2013-04-11 LAB — POCT PREGNANCY, URINE: PREG TEST UR: NEGATIVE

## 2013-04-11 MED ORDER — MECLIZINE HCL 25 MG PO TABS: 25.0000 mg | ORAL_TABLET | Freq: Three times a day (TID) | ORAL | Status: DC | PRN

## 2013-04-11 NOTE — ED Notes (Addendum)
Pt. reports lightheaded this evening with nausea and temporary vision loss at right eye ( 10 mins) / left leg discomfort/tightness/burning/chills  , pt. stated history of brain tumor , alert and oriented / speech clear / equal grips with no facial asymmetry. Respirations unlabored .

## 2013-04-11 NOTE — ED Notes (Signed)
Pt. Is alert and oriented , speech clear , no facial asymmetry , equal grips with no arm drift.

## 2013-04-11 NOTE — ED Provider Notes (Signed)
CSN: [id number]     Arrival date & time 04/11/13  2050 History   First MD Initiated Contact with Patient 04/11/13 2342     Chief Complaint  Patient presents with  . Dizziness   (Consider location/radiation/quality/duration/timing/severity/associated sxs/prior Treatment) HPI Comments: Patient presents to the ER for evaluation of dizziness. Patient had onset of visual disturbance and severe dizziness while watching TV today. She said her vision got very blurry or. Her symptoms have progressively improved and now are resolved at time of evaluation here in the ER. She did not have any headache. No unilateral numbness, tingling or weakness.  Patient is a 38 y.o. female presenting with dizziness.  Dizziness   Past Medical History  Diagnosis Date  . TIA (transient ischemic attack)   . Seizures   . Brain tumor 2011  . Embolism - blood clot in left ovary  . Meningioma 08/14/2011    S/p craniotomy Encephaloma present non changing on MRI in 08/2011  . Anxiety   . Gallstones   . Asthma   . Clotting disorder   . Stroke   . Chills   . Unintentional weight loss   . Visual disturbance   . Palpitations   . Abdominal pain   . Rectal bleeding   . Nausea   . Rectal pain   . Blood in urine   . Weakness   . Confusion    Past Surgical History  Procedure Laterality Date  . Brain tumor removed  2011  . Ovarian mass removed  2010    left  . Cholecystectomy  12/03/2011    Procedure: LAPAROSCOPIC CHOLECYSTECTOMY WITH INTRAOPERATIVE CHOLANGIOGRAM;  Surgeon: Madilyn Hook, DO;  Location: [facility] OR;  Service: General;  Laterality: N/A;   Family History  Problem Relation Age of Onset  . Depression Mother   . Alcohol abuse Sister   . Alcohol abuse Brother   . Alzheimer's disease Maternal Grandmother   . Diabetes Maternal Grandmother   . Stroke Maternal Grandmother   . Heart failure Father   . Hypertension Father   . Heart disease Father    History  Substance Use Topics  . Smoking status: Never  Smoker   . Smokeless tobacco: Not on file  . Alcohol Use: No   OB History   Grav Para Term Preterm Abortions TAB SAB Ect Mult Living                 Review of Systems  Eyes: Positive for visual disturbance.  Neurological: Positive for dizziness.  All other systems reviewed and are negative.    Allergies  Morphine and related  Home Medications   Current Outpatient Rx  Name  Route  Sig  Dispense  Refill  . Ascorbic Acid (VITAMIN C GUMMIE PO)   Oral   Take 1 tablet by mouth daily.         . calcium citrate-vitamin D (CITRACAL+D) 315-200 MG-UNIT per tablet   Oral   Take 2 tablets by mouth daily.         . Cholecalciferol (VITAMIN D) 2000 UNITS tablet   Oral   Take 2,000 Units by mouth daily.         . meclizine (ANTIVERT) 25 MG tablet   Oral   Take 1 tablet (25 mg total) by mouth 3 (three) times daily as needed for dizziness.   30 tablet   0   . Multiple Vitamin (MULTIVITAMIN WITH MINERALS) TABS   Oral   Take 1 tablet by mouth daily.  BP 107/55  Pulse 56  Temp(Src) 98 F (36.7 C) (Oral)  Resp 16  SpO2 100%  LMP 04/08/2013 Physical Exam  Constitutional: She is oriented to person, place, and time. She appears well-developed and well-nourished. No distress.  HENT:  Head: Normocephalic and atraumatic.  Right Ear: Hearing normal.  Left Ear: Hearing normal.  Nose: Nose normal.  Mouth/Throat: Oropharynx is clear and moist and mucous membranes are normal.  Eyes: Conjunctivae and EOM are normal. Pupils are equal, round, and reactive to light.  Neck: Normal range of motion. Neck supple.  Cardiovascular: Regular rhythm, S1 normal and S2 normal.  Exam reveals no gallop and no friction rub.   No murmur heard. Pulmonary/Chest: Effort normal and breath sounds normal. No respiratory distress. She exhibits no tenderness.  Abdominal: Soft. Normal appearance and bowel sounds are normal. There is no hepatosplenomegaly. There is no tenderness. There is no  rebound, no guarding, no tenderness at McBurney's point and negative Murphy's sign. No hernia.  Musculoskeletal: Normal range of motion.  Neurological: She is alert and oriented to person, place, and time. She has normal strength. No cranial nerve deficit or sensory deficit. Coordination normal. GCS eye subscore is 4. GCS verbal subscore is 5. GCS motor subscore is 6.  Skin: Skin is warm, dry and intact. No rash noted. No cyanosis.  Psychiatric: She has a normal mood and affect. Her speech is normal and behavior is normal. Thought content normal.    ED Course  Procedures (including critical care time) Labs Review Labs Reviewed  COMPREHENSIVE METABOLIC PANEL - Abnormal; Notable for the following:    Glucose, Bld 104 (*)    Total Bilirubin <0.2 (*)    All other components within normal limits  CBC WITH DIFFERENTIAL  URINALYSIS, ROUTINE W REFLEX MICROSCOPIC  POCT PREGNANCY, URINE   Imaging Review Ct Head (brain) Wo Contrast  04/11/2013   CLINICAL DATA:  Dizziness  EXAM: CT HEAD WITHOUT CONTRAST  TECHNIQUE: Contiguous axial images were obtained from the base of the skull through the vertex without intravenous contrast.  COMPARISON:  11/26/2011  FINDINGS: Skull and Sinuses:Unremarkable appearance of previous left temporal parietal craniotomy. There is a 13 mm cystic structure extending into the posterior right maxillary antrum, which has a neighboring tooth root. The tooth is partly imaged, but based on location is unlikely to be fully ruptured. This cyst has smooth margins.  Orbits: No acute abnormality.  Brain: No evidence of acute abnormality, such as acute infarction, hemorrhage, hydrocephalus, or mass lesion/mass effect. Unchanged enlargement of the extra-axial space deep to the bone flap. No evidence of recurrent mass.  IMPRESSION: 1. No acute intracranial abnormality. 2. Stable postoperative changes from left cerebral convexity meningioma resection. 3. 13 mm dentigerous cyst in the upper right  maxilla.   Electronically Signed   By: Jorje Guild M.D.   On: 04/11/2013 22:48    EKG Interpretation   None       MDM   1. Vertigo    Patient presents to the ER with dizziness which sounds vertiginous. She does have a history of TIA, but there was nothing unilateral, no speech involvement. Her symptoms have completely resolved. A head CT was performed and is negative for any acute pathology. She reports a history of seizures as well, this did not seem like a seizure.  Blood work was normal. Patient is at her normal baseline. Do not feel any further interventions are necessary. We'll treat symptomatically with meclizine, follow up with her primary doctor to  see if any further testing is necessary. Return if symptoms worsen.    Orpah Greek, MD 04/12/13 0000

## 2013-04-11 NOTE — Discharge Instructions (Signed)
Dizziness Dizziness is a common problem. It is a feeling of unsteadiness or lightheadedness. You may feel like you are about to faint. Dizziness can lead to injury if you stumble or fall. A person of any age group can suffer from dizziness, but dizziness is more common in older adults. CAUSES  Dizziness can be caused by many different things, including:  Middle ear problems.  Standing for too long.  Infections.  An allergic reaction.  Aging.  An emotional response to something, such as the sight of blood.  Side effects of medicines.  Fatigue.  Problems with circulation or blood pressure.  Excess use of alcohol, medicines, or illegal drug use.  Breathing too fast (hyperventilation).  An arrhythmia or problems with your heart rhythm.  Low red blood cell count (anemia).  Pregnancy.  Vomiting, diarrhea, fever, or other illnesses that cause dehydration.  Diseases or conditions such as Parkinson's disease, high blood pressure (hypertension), diabetes, and thyroid problems.  Exposure to extreme heat. DIAGNOSIS  To find the cause of your dizziness, your caregiver may do a physical exam, lab tests, radiologic imaging scans, or an electrocardiography test (ECG).  TREATMENT  Treatment of dizziness depends on the cause of your symptoms and can vary greatly. HOME CARE INSTRUCTIONS   Drink enough fluids to keep your urine clear or pale yellow. This is especially important in very hot weather. In the elderly, it is also important in cold weather.  If your dizziness is caused by medicines, take them exactly as directed. When taking blood pressure medicines, it is especially important to get up slowly.  Rise slowly from chairs and steady yourself until you feel okay.  In the morning, first sit up on the side of the bed. When this seems okay, stand slowly while holding onto something until you know your balance is fine.  If you need to stand in one place for a long time, be sure to  move your legs often. Tighten and relax the muscles in your legs while standing.  If dizziness continues to be a problem, have someone stay with you for a day or two. Do this until you feel you are well enough to stay alone. Have the person call your caregiver if he or she notices changes in you that are concerning.  Do not drive or use heavy machinery if you feel dizzy.  Do not drink alcohol. SEEK IMMEDIATE MEDICAL CARE IF:   Your dizziness or lightheadedness gets worse.  You feel nauseous or vomit.  You develop problems with talking, walking, weakness, or using your arms, hands, or legs.  You are not thinking clearly or you have difficulty forming sentences. It may take a friend or family member to determine if your thinking is normal.  You develop chest pain, abdominal pain, shortness of breath, or sweating.  Your vision changes.  You notice any bleeding.  You have side effects from medicine that seems to be getting worse rather than better. MAKE SURE YOU:   Understand these instructions.  Will watch your condition.  Will get help right away if you are not doing well or get worse. Document Released: 08/22/2000 Document Revised: 05/21/2011 Document Reviewed: 09/15/2010 [facility] Patient Information 2014 [place], [place].  Vertigo Vertigo means you feel like you or your surroundings are moving when they are not. Vertigo can be dangerous if it occurs when you are at work, driving, or performing difficult activities.  CAUSES  Vertigo occurs when there is a conflict of signals sent to your brain  from the visual and sensory systems in your body. There are many different causes of vertigo, including:  Infections, especially in the inner ear.  A bad reaction to a drug or misuse of alcohol and medicines.  Withdrawal from drugs or alcohol.  Rapidly changing positions, such as lying down or rolling over in bed.  A migraine headache.  Decreased blood flow to the  brain.  Increased pressure in the brain from a head injury, infection, tumor, or bleeding. SYMPTOMS  You may feel as though the world is spinning around or you are falling to the ground. Because your balance is upset, vertigo can cause nausea and vomiting. You may have involuntary eye movements (nystagmus). DIAGNOSIS  Vertigo is usually diagnosed by physical exam. If the cause of your vertigo is unknown, your caregiver may perform imaging tests, such as an MRI scan (magnetic resonance imaging). TREATMENT  Most cases of vertigo resolve on their own, without treatment. Depending on the cause, your caregiver may prescribe certain medicines. If your vertigo is related to body position issues, your caregiver may recommend movements or procedures to correct the problem. In rare cases, if your vertigo is caused by certain inner ear problems, you may need surgery. HOME CARE INSTRUCTIONS   Follow your caregiver's instructions.  Avoid driving.  Avoid operating heavy machinery.  Avoid performing any tasks that would be dangerous to you or others during a vertigo episode.  Tell your caregiver if you notice that certain medicines seem to be causing your vertigo. Some of the medicines used to treat vertigo episodes can actually make them worse in some people. SEEK IMMEDIATE MEDICAL CARE IF:   Your medicines do not relieve your vertigo or are making it worse.  You develop problems with talking, walking, weakness, or using your arms, hands, or legs.  You develop severe headaches.  Your nausea or vomiting continues or gets worse.  You develop visual changes.  A family member notices behavioral changes.  Your condition gets worse. MAKE SURE YOU:  Understand these instructions.  Will watch your condition.  Will get help right away if you are not doing well or get worse. Document Released: 12/06/2004 Document Revised: 05/21/2011 Document Reviewed: 09/14/2010 [facility] Patient Information 2014  [place].

## 2013-04-11 NOTE — ED Notes (Signed)
Patient transported to CT 

## 2013-08-27 ENCOUNTER — Observation Stay (HOSPITAL_COMMUNITY)
Admission: EM | Admit: 2013-08-27 | Discharge: 2013-08-28 | Disposition: A | Discharge status: Home / Self Care | Payer: Medicare Other | Attending: Family Medicine | Admitting: Family Medicine

## 2013-08-27 ENCOUNTER — Observation Stay (HOSPITAL_COMMUNITY): Payer: Medicare Other

## 2013-08-27 ENCOUNTER — Encounter (HOSPITAL_COMMUNITY): Payer: Self-pay | Admitting: Emergency Medicine

## 2013-08-27 ENCOUNTER — Emergency Department (HOSPITAL_COMMUNITY): Payer: Medicare Other

## 2013-08-27 DIAGNOSIS — F411 Generalized anxiety disorder: Secondary | ICD-10-CM | POA: Insufficient documentation

## 2013-08-27 DIAGNOSIS — R42 Dizziness and giddiness: Secondary | ICD-10-CM | POA: Insufficient documentation

## 2013-08-27 DIAGNOSIS — H539 Unspecified visual disturbance: Secondary | ICD-10-CM | POA: Insufficient documentation

## 2013-08-27 DIAGNOSIS — Z86011 Personal history of benign neoplasm of the brain: Secondary | ICD-10-CM | POA: Insufficient documentation

## 2013-08-27 DIAGNOSIS — Z8673 Personal history of transient ischemic attack (TIA), and cerebral infarction without residual deficits: Secondary | ICD-10-CM | POA: Insufficient documentation

## 2013-08-27 DIAGNOSIS — G458 Other transient cerebral ischemic attacks and related syndromes: Principal | ICD-10-CM | POA: Insufficient documentation

## 2013-08-27 DIAGNOSIS — Z86718 Personal history of other venous thrombosis and embolism: Secondary | ICD-10-CM | POA: Insufficient documentation

## 2013-08-27 DIAGNOSIS — G459 Transient cerebral ischemic attack, unspecified: Secondary | ICD-10-CM | POA: Diagnosis present

## 2013-08-27 DIAGNOSIS — D689 Coagulation defect, unspecified: Secondary | ICD-10-CM | POA: Insufficient documentation

## 2013-08-27 DIAGNOSIS — R4789 Other speech disturbances: Secondary | ICD-10-CM | POA: Insufficient documentation

## 2013-08-27 DIAGNOSIS — R209 Unspecified disturbances of skin sensation: Secondary | ICD-10-CM | POA: Insufficient documentation

## 2013-08-27 DIAGNOSIS — R29810 Facial weakness: Secondary | ICD-10-CM | POA: Insufficient documentation

## 2013-08-27 DIAGNOSIS — G40909 Epilepsy, unspecified, not intractable, without status epilepticus: Secondary | ICD-10-CM | POA: Insufficient documentation

## 2013-08-27 LAB — URINALYSIS, ROUTINE W REFLEX MICROSCOPIC
Bilirubin Urine: NEGATIVE
Glucose, UA: NEGATIVE mg/dL
Hgb urine dipstick: NEGATIVE
Ketones, ur: 15 mg/dL — AB
Leukocytes, UA: NEGATIVE
NITRITE: NEGATIVE
Protein, ur: NEGATIVE mg/dL
SPECIFIC GRAVITY, URINE: 1.027 (ref 1.005–1.030)
UROBILINOGEN UA: 0.2 mg/dL (ref 0.0–1.0)
pH: 5.5 (ref 5.0–8.0)

## 2013-08-27 LAB — CBC WITH DIFFERENTIAL/PLATELET
BASOS ABS: 0 10*3/uL (ref 0.0–0.1)
Basophils Relative: 0 % (ref 0–1)
EOS ABS: 0.1 10*3/uL (ref 0.0–0.7)
Eosinophils Relative: 2 % (ref 0–5)
HCT: 37.3 % (ref 36.0–46.0)
HEMOGLOBIN: 12.7 g/dL (ref 12.0–15.0)
Lymphocytes Relative: 20 % (ref 12–46)
Lymphs Abs: 1.5 10*3/uL (ref 0.7–4.0)
MCH: 29.1 pg (ref 26.0–34.0)
MCHC: 34 g/dL (ref 30.0–36.0)
MCV: 85.4 fL (ref 78.0–100.0)
MONOS PCT: 6 % (ref 3–12)
Monocytes Absolute: 0.5 10*3/uL (ref 0.1–1.0)
NEUTROS PCT: 72 % (ref 43–77)
Neutro Abs: 5.4 10*3/uL (ref 1.7–7.7)
Platelets: 262 10*3/uL (ref 150–400)
RBC: 4.37 MIL/uL (ref 3.87–5.11)
RDW: 13 % (ref 11.5–15.5)
WBC: 7.4 10*3/uL (ref 4.0–10.5)

## 2013-08-27 LAB — RAPID URINE DRUG SCREEN, HOSP PERFORMED
AMPHETAMINES: NOT DETECTED
Barbiturates: NOT DETECTED
Benzodiazepines: NOT DETECTED
COCAINE: NOT DETECTED
OPIATES: NOT DETECTED
Tetrahydrocannabinol: NOT DETECTED

## 2013-08-27 LAB — PROTIME-INR
INR: 0.95 (ref 0.00–1.49)
PROTHROMBIN TIME: 12.5 s (ref 11.6–15.2)

## 2013-08-27 LAB — COMPREHENSIVE METABOLIC PANEL
ALBUMIN: 3.7 g/dL (ref 3.5–5.2)
ALT: 9 U/L (ref 0–35)
AST: 13 U/L (ref 0–37)
Alkaline Phosphatase: 58 U/L (ref 39–117)
BUN: 12 mg/dL (ref 6–23)
CO2: 25 mEq/L (ref 19–32)
CREATININE: 0.65 mg/dL (ref 0.50–1.10)
Calcium: 9.3 mg/dL (ref 8.4–10.5)
Chloride: 101 mEq/L (ref 96–112)
GFR calc Af Amer: >90 mL/min (ref >90)
GFR calc non Af Amer: >90 mL/min (ref >90)
Glucose, Bld: 98 mg/dL (ref 70–99)
Potassium: 3.8 mEq/L (ref 3.7–5.3)
Sodium: 138 mEq/L (ref 137–147)
Total Bilirubin: 0.2 mg/dL — ABNORMAL LOW (ref 0.3–1.2)
Total Protein: 7.4 g/dL (ref 6.0–8.3)

## 2013-08-27 LAB — PREGNANCY, URINE: PREG TEST UR: NEGATIVE

## 2013-08-27 MED ORDER — SODIUM CHLORIDE 0.9 % IJ SOLN: 3.0000 mL | INTRAMUSCULAR | Status: DC | PRN

## 2013-08-27 MED ORDER — SODIUM CHLORIDE 0.9 % IJ SOLN
3.0000 mL | Freq: Two times a day (BID) | INTRAMUSCULAR | Status: DC
  Administered 2013-08-28: 3 mL via INTRAVENOUS

## 2013-08-27 MED ORDER — ASPIRIN 81 MG PO CHEW
81.0000 mg | CHEWABLE_TABLET | Freq: Every day | ORAL | Status: DC
  Administered 2013-08-27 – 2013-08-28 (×2): 81 mg via ORAL
  Filled 2013-08-27 (×2): qty 1

## 2013-08-27 MED ORDER — ENOXAPARIN SODIUM 40 MG/0.4ML ~~LOC~~ SOLN
40.0000 mg | SUBCUTANEOUS | Status: DC
  Administered 2013-08-27: 40 mg via SUBCUTANEOUS
  Filled 2013-08-27 (×2): qty 0.4

## 2013-08-27 MED ORDER — SODIUM CHLORIDE 0.9 % IV SOLN: 250.0000 mL | INTRAVENOUS | Status: DC | PRN

## 2013-08-27 NOTE — ED Notes (Signed)
Patient transported to CT 

## 2013-08-27 NOTE — H&P (Signed)
[name] and Physical Service Pager: [phone]  Patient name: Melanie [name] Medical record number: [id number] Date of birth: 01-08-1976 Age: 38 y.o. Gender: female  Primary Care Provider: Thersa Salt, DO Consultants: Neuro Code Status: Full  Chief Complaint: Right-sided facial and arm numbness  Assessment and Plan: Melanie [name] is a 38 y.o. female presenting with transient right-sided sensory and cranial nerve deficits. Past medical [name] is significant for previous TIAs, seizures, and meningioma s/p resection in 2011.   Suspected TIA: Transient right-sided combined sensory and motor deficits lasting 15-20 minutes. CT head negative. [name] of TIA symptoms not on antiplatelet medication.  - Admit to FMTS on telemetry floor - Neurology, Dr. Armida Sans consulted in ED and will consult for TIA work up.  - Hemoglobin A1c (5.2 in 2013), lipids (LDL 155 in 2013) - will repeat these during this hospitalization - MRI/MRA of brain w/o contrast  - Neuro checks per protocol  - Carotid dopplers (minimal plaque without ICA stenosis in 2013) - 2D Echocardiogram (EF 60-65% without mention of thrombus, atrial septum not visualized in 2013) - PT/OT/SLP - daily ASA 81 mg - given [name] of ovarian blood clot and multiple TIAs, may be worth pursuing a hypercoagulable work-up as an outpatient  [name] of meningioma: s/p craniotomy and resection in 2011. MRI in 2013 showed no recurrence.  FEN/GI: Regular diet after passing swallow screen; saline lock IV Prophylaxis: Prophylactic-dose lovenox  Disposition: Admit to FMTS on telemetry, Dr. Mingo Amber attending.  [name] of Present Illness: Melanie [name] is a 38 y.o. female presenting after 15 minute episode of numbness and facial droop. She has a [name] of TIAs and meningioma s/p craniotomy and resection in 2011.   Melanie [name] reports experiencing light-headedness and visual disturbances while  sitting at the pool with her niece and nephew this afternoon. Visual disturbances are described acutely as black spots that come and go, but also as blurriness that has bothered her ever since the craniotomy for meningioma resection in 2011. Suddenly she noticed that her mouth, tongue, and right arm became numb. Bystanders reported facial droop and she endorses slurred speech. She reports feeling like she may faint but denies syncope. Symptoms spontaneously resolved in 15-20 minutes by the time EMS arrived. No chest pain, shortness of breath, or palpitations.   She has had TIAs with similar presentations prior to and following the craniotomy. She takes no aspirin. Serial MRI studies have shown no recurrence of meningioma as recently as August 2013. She sees Dr. Sherwood Gambler intermittently. Her neurologist is Dr. Leonie Man, and her PCP is Dr. Lacinda Axon, though she has not been seen by doctors in a while. She denies taking any medications other than a multivitamin and reports no medical conditions currently, though did have seizures related to her meningioma. CVA runs in her family. Her father passed away of MI at age 59. Rarely drinks EtOH. Denies tobacco and illicit drugs. She states her next follow-up with Dr Sherwood Gambler is supposed to be in 2016.   In the ED she had an unremarkable CBC, CMET, UA, and INR. Her CT head was without acute abnormalities.  Review Of Systems: Per HPI Otherwise 12 point review of systems was performed and was unremarkable.  Patient Active Problem List   Diagnosis Date Noted  . Pre-syncope 01/30/2012  . Major depressive disorder 11/05/2011  . Dizziness 08/15/2011  . Insomnia 08/15/2011  . Meningioma 08/14/2011   Past Medical [name]: Past Medical [name]  Diagnosis Date  . TIA (  transient ischemic attack)   . Seizures   . Brain tumor 2011  . Embolism - blood clot in left ovary  . Meningioma 08/14/2011    S/p craniotomy Encephaloma present non changing on MRI in 08/2011  . Anxiety    . Gallstones   . Asthma   . Clotting disorder   . Chills   . Unintentional weight loss   . Visual disturbance   . Palpitations   . Abdominal pain   . Rectal bleeding   . Nausea   . Rectal pain   . Blood in urine   . Weakness   . Confusion    Past Surgical [name]: Past Surgical [name]  Procedure Laterality Date  . Brain tumor removed  2011  . Ovarian mass removed  2010    left  . Cholecystectomy  12/03/2011    Procedure: LAPAROSCOPIC CHOLECYSTECTOMY WITH INTRAOPERATIVE CHOLANGIOGRAM;  Surgeon: Madilyn Hook, DO;  Location: [facility];  Service: General;  Laterality: N/A;   Social [name]: [name]  Substance Use Topics  . Smoking status: Never Smoker   . Smokeless tobacco: Not on file  . Alcohol Use: No   Additional social [name]: per HPI Please also refer to relevant sections of EMR.  Family [name]: Family [name]  Problem Relation Age of Onset  . Depression Mother   . Alcohol abuse Sister   . Alcohol abuse Brother   . Alzheimer's disease Maternal Grandmother   . Diabetes Maternal Grandmother   . Stroke Maternal Grandmother   . Heart failure Father   . Hypertension Father   . Heart disease Father    Allergies and Medications: Allergies  Allergen Reactions  . Morphine And Related Nausea And Vomiting   No current facility-administered medications on file prior to encounter.   Current Outpatient Prescriptions on File Prior to Encounter  Medication Sig Dispense Refill  . meclizine (ANTIVERT) 25 MG tablet Take 1 tablet (25 mg total) by mouth 3 (three) times daily as needed for dizziness.  30 tablet  0  . Multiple Vitamin (MULTIVITAMIN WITH MINERALS) TABS Take 1 tablet by mouth daily.        Objective: BP 104/66  Pulse 69  Temp(Src) 98.8 F (37.1 C) (Oral)  Resp 15  Ht 5\' 2"  (1.575 m)  Wt 160 lb (72.576 kg)  BMI 29.26 kg/m2  SpO2 97%  LMP 08/23/2013 Exam: General: Well-appearing pleasant 38 y.o. female in NAD HEENT: NCAT, MMM, sclerae normal,  oropharynx clear Cardiovascular: RRR, no murmur, DP pulses 2+ bilaterally Respiratory: Non-labored on ambient air, CTAB Abdomen: +BS, soft, NT, ND Extremities: WWP, no edema or deformities Skin: No rash, lesion, or wound Neurologic Exam: Alert and oriented, CN II-XII intact, speech and gait are normal, strength symmetric 5/5 throughout, sensation intact to light touch throughout, no dysmetria or dysdiadochokinesia, patellar DTRs 2+   Labs and Imaging: CBC BMET   Recent Labs Lab 08/27/13 1652  WBC 7.4  HGB 12.7  HCT 37.3  PLT 262    Recent Labs Lab 08/27/13 1652  NA 138  K 3.8  CL 101  CO2 25  BUN 12  CREATININE 0.65  GLUCOSE 98  CALCIUM 9.3     Urine pregnancy: Negative U/A: ketones 15; otherwise wnl   CT HEAD W/O CONTRAST  Patient has had previous left parietal craniotomy. There is no intra  or extra-axial fluid collection or mass lesion. The basilar cisterns  and ventricles have a normal appearance. There is no CT evidence for  acute infarction or hemorrhage.  Bone windows show a smoothly marginated cyst within the antrum of  the right maxillary sinus, incompletely imaged on today's study but  and cystic with a dentigerous cyst based on prior study.  IMPRESSION:  1. No evidence for acute intracranial abnormality.  2. Status post left parietal craniotomy.  Vance Gather, MD 08/27/2013, 7:07 PM PGY-1, [facility] pager: [phone], text pages welcome  Upper Level Addendum:  I have seen and evaluated this patient along with Dr. Bonner Puna and reviewed the above note, making necessary revisions in red.   Tommi Rumps, MD Family Medicine PGY-2

## 2013-08-27 NOTE — Consult Note (Signed)
NEURO HOSPITALIST CONSULT NOTE    Reason for Consult: right arm numbness-weakness, dysarthria.  HPI:                                                                                                                                          Melanie [name] is an 38 y.o. female, right handed, with a past medical history significant for removal left parietal meningioma, TIA, anxiety, clotting disorder, comes in after sustaining a transient episode of right arm numbness-weakness, right face weakness, and dysarthria earlier today that lasted for approximately 15 minutes. She said that she was at a pool babysitting her niece when suddenly the right side of her face-tongue-cheek as well as the right arm became numb and weak. She stated that her speech was slurred and people around her noticed that the right face was " droopy". She had some lightheadedness but no associated vertigo, double vision, or weakness of the right leg. Further, denies confusion, unsteadiness, or vision impairment. Stated that never had similar symptoms before and her prior TIA's were completely different. Frequent HA. CT brain today unremarkable for acute abnormality. Feels back to baseline.  Past Medical History  Diagnosis Date  . TIA (transient ischemic attack)   . Seizures   . Brain tumor 2011  . Embolism - blood clot in left ovary  . Meningioma 08/14/2011    S/p craniotomy Encephaloma present non changing on MRI in 08/2011  . Anxiety   . Gallstones   . Asthma   . Clotting disorder   . Chills   . Unintentional weight loss   . Visual disturbance   . Palpitations   . Abdominal pain   . Rectal bleeding   . Nausea   . Rectal pain   . Blood in urine   . Weakness   . Confusion     Past Surgical History  Procedure Laterality Date  . Brain tumor removed  2011  . Ovarian mass removed  2010    left  . Cholecystectomy  12/03/2011    Procedure: LAPAROSCOPIC CHOLECYSTECTOMY WITH INTRAOPERATIVE  CHOLANGIOGRAM;  Surgeon: Madilyn Hook, DO;  Location: [facility];  Service: General;  Laterality: N/A;    Family History  Problem Relation Age of Onset  . Depression Mother   . Alcohol abuse Sister   . Alcohol abuse Brother   . Alzheimer's disease Maternal Grandmother   . Diabetes Maternal Grandmother   . Stroke Maternal Grandmother   . Heart failure Father   . Hypertension Father   . Heart disease Father     Social History:  reports that she has never smoked. She does not have any smokeless tobacco history on file. She reports that she does not drink alcohol or use illicit drugs.  Allergies  Allergen Reactions  .  Morphine And Related Nausea And Vomiting    MEDICATIONS:                                                                                                                     I have reviewed the patient's current medications.   ROS:                                                                                                                                       History obtained from the patient and chart review  General ROS: negative for - chills, fatigue, fever, night sweats, weight gain or weight loss Psychological ROS: negative for - behavioral disorder, hallucinations, memory difficulties, mood swings or suicidal ideation Ophthalmic ROS: negative for - blurry vision, double vision, eye pain or loss of vision ENT ROS: negative for - epistaxis, nasal discharge, oral lesions, sore throat, tinnitus or vertigo Allergy and Immunology ROS: negative for - hives or itchy/watery eyes Hematological and Lymphatic ROS: negative for - bleeding problems, bruising or swollen lymph nodes Endocrine ROS: negative for - galactorrhea, hair pattern changes, polydipsia/polyuria or temperature intolerance Respiratory ROS: negative for - cough, hemoptysis, shortness of breath or wheezing Cardiovascular ROS: negative for - chest pain, dyspnea on exertion, edema or irregular  heartbeat Gastrointestinal ROS: negative for - abdominal pain, diarrhea, hematemesis, nausea/vomiting or stool incontinence Genito-Urinary ROS: negative for - dysuria, hematuria, incontinence or urinary frequency/urgency Musculoskeletal ROS: negative for - joint swelling or muscular weakness Neurological ROS: as noted in HPI Dermatological ROS: negative for rash and skin lesion changes  Physical exam: pleasant female in no apparent distress. Blood pressure 119/74, pulse 85, temperature 98.8 F (37.1 C), temperature source Oral, resp. rate 18, height 5' 2"  (1.575 m), weight 72.576 kg (160 lb), last menstrual period 08/23/2013, SpO2 100.00%. Head: normocephalic. Neck: supple, no bruits, no JVD. Cardiac: no murmurs. Lungs: clear. Abdomen: soft, no tender, no mass. Extremities: no edema. CV: pulses palpable throughout  Neurologic Examination:  Mental Status: Alert, oriented, thought content appropriate.  Speech fluent without evidence of aphasia.  Able to follow 3 step commands without difficulty. Cranial Nerves: II: Discs flat bilaterally; Visual fields grossly normal, pupils equal, round, reactive to light and accommodation III,IV, VI: ptosis not present, extra-ocular motions intact bilaterally V,VII: smile symmetric, facial light touch sensation normal bilaterally VIII: hearing normal bilaterally IX,X: gag reflex present XI: bilateral shoulder shrug XII: midline tongue extension without atrophy or fasciculations Motor: Right : Upper extremity   5/5    Left:     Upper extremity   5/5  Lower extremity   5/5     Lower extremity   5/5 Tone and bulk:normal tone throughout; no atrophy noted Sensory: Pinprick and light touch intact throughout, bilaterally Deep Tendon Reflexes:  Right: Upper Extremity   Left: Upper extremity   biceps (C-5 to C-6) 2/4   biceps (C-5 to C-6) 2/4 tricep (C7)  2/4    triceps (C7) 2/4 Brachioradialis (C6) 2/4  Brachioradialis (C6) 2/4  Lower Extremity Lower Extremity  quadriceps (L-2 to L-4) 2/4   quadriceps (L-2 to L-4) 2/4 Achilles (S1) 2/4   Achilles (S1) 2/4  Plantars: Right: downgoing   Left: downgoing Cerebellar: normal finger-to-nose,  normal heel-to-shin test Gait:  No edema    Lab Results  Component Value Date/Time   CHOL 214* 08/13/2011  3:47 AM    Results for orders placed during the hospital encounter of 08/27/13 (from the past 48 hour(s))  URINALYSIS, ROUTINE W REFLEX MICROSCOPIC     Status: Abnormal   Collection Time    08/27/13  4:37 PM      Result Value Ref Range   Color, Urine YELLOW  YELLOW   APPearance CLEAR  CLEAR   Specific Gravity, Urine 1.027  1.005 - 1.030   pH 5.5  5.0 - 8.0   Glucose, UA NEGATIVE  NEGATIVE mg/dL   Hgb urine dipstick NEGATIVE  NEGATIVE   Bilirubin Urine NEGATIVE  NEGATIVE   Ketones, ur 15 (*) NEGATIVE mg/dL   Protein, ur NEGATIVE  NEGATIVE mg/dL   Urobilinogen, UA 0.2  0.0 - 1.0 mg/dL   Nitrite NEGATIVE  NEGATIVE   Leukocytes, UA NEGATIVE  NEGATIVE   Comment: MICROSCOPIC NOT DONE ON URINES WITH NEGATIVE PROTEIN, BLOOD, LEUKOCYTES, NITRITE, OR GLUCOSE <1000 mg/dL.  PREGNANCY, URINE     Status: None   Collection Time    08/27/13  4:37 PM      Result Value Ref Range   Preg Test, Ur NEGATIVE  NEGATIVE   Comment:            THE SENSITIVITY OF THIS     METHODOLOGY IS >20 mIU/mL.  CBC WITH DIFFERENTIAL     Status: None   Collection Time    08/27/13  4:52 PM      Result Value Ref Range   WBC 7.4  4.0 - 10.5 K/uL   RBC 4.37  3.87 - 5.11 MIL/uL   Hemoglobin 12.7  12.0 - 15.0 g/dL   HCT 37.3  36.0 - 46.0 %   MCV 85.4  78.0 - 100.0 fL   MCH 29.1  26.0 - 34.0 pg   MCHC 34.0  30.0 - 36.0 g/dL   RDW 13.0  11.5 - 15.5 %   Platelets 262  150 - 400 K/uL   Neutrophils Relative % 72  43 - 77 %   Neutro Abs 5.4  1.7 - 7.7 K/uL   Lymphocytes Relative 20  12 -  46 %   Lymphs Abs 1.5  0.7 - 4.0  K/uL   Monocytes Relative 6  3 - 12 %   Monocytes Absolute 0.5  0.1 - 1.0 K/uL   Eosinophils Relative 2  0 - 5 %   Eosinophils Absolute 0.1  0.0 - 0.7 K/uL   Basophils Relative 0  0 - 1 %   Basophils Absolute 0.0  0.0 - 0.1 K/uL  COMPREHENSIVE METABOLIC PANEL     Status: Abnormal   Collection Time    08/27/13  4:52 PM      Result Value Ref Range   Sodium 138  137 - 147 mEq/L   Potassium 3.8  3.7 - 5.3 mEq/L   Chloride 101  96 - 112 mEq/L   CO2 25  19 - 32 mEq/L   Glucose, Bld 98  70 - 99 mg/dL   BUN 12  6 - 23 mg/dL   Creatinine, Ser 0.65  0.50 - 1.10 mg/dL   Calcium 9.3  8.4 - 10.5 mg/dL   Total Protein 7.4  6.0 - 8.3 g/dL   Albumin 3.7  3.5 - 5.2 g/dL   AST 13  0 - 37 U/L   ALT 9  0 - 35 U/L   Alkaline Phosphatase 58  39 - 117 U/L   Total Bilirubin 0.2 (*) 0.3 - 1.2 mg/dL   GFR calc non Af Amer >90  >90 mL/min   GFR calc Af Amer >90  >90 mL/min   Comment: (NOTE)     The eGFR has been calculated using the CKD EPI equation.     This calculation has not been validated in all clinical situations.     eGFR's persistently <90 mL/min signify possible Chronic Kidney     Disease.  PROTIME-INR     Status: None   Collection Time    08/27/13  4:52 PM      Result Value Ref Range   Prothrombin Time 12.5  11.6 - 15.2 seconds   INR 0.95  0.00 - 1.49    Ct Head Wo Contrast  08/27/2013   CLINICAL DATA:  Numbness in the right arm and right-sided valve began at 3 p.m. and has resolved. The patient was at the: An AP. Patient also felt near syncopal. Dizziness, lightheadedness.  EXAM: CT HEAD WITHOUT CONTRAST  TECHNIQUE: Contiguous axial images were obtained from the base of the skull through the vertex without intravenous contrast.  COMPARISON:  04/11/2013  FINDINGS: Patient has had previous left parietal craniotomy. There is no intra or extra-axial fluid collection or mass lesion. The basilar cisterns and ventricles have a normal appearance. There is no CT evidence for acute infarction or  hemorrhage.  Bone windows show a smoothly marginated cyst within the antrum of the right maxillary sinus, incompletely imaged on today's study but and cystic with a dentigerous cyst based on prior study.  IMPRESSION: 1. No evidence for acute intracranial abnormality. 2. Status post left parietal craniotomy.   Electronically Signed   By: Shon Hale M.D.   On: 08/27/2013 17:35   Assessment/Plan: 38 y/o comes in after sustaining a transient episode of right armnumbness-weakness, right face weakness, and dysarthria earlier today that lasted for approximately 15 minutes. Non focal neuro-exam. CT brain unremarkable. Apparently she has history of a clotting disorder and had had TIA's in the past, thus will suggest admission to the hospital to complete TIA work up.  Will get TIA work up. Aspirin 81 mg daily. Will follow up.  Dorian Pod, MD 08/27/2013, 7:00 PM [facility]

## 2013-08-27 NOTE — ED Notes (Signed)
Pt was at the pool in the heat, per EMS and pt, she had an episode of numbness in right arm and right side of mouth that started at 1500 and has since resolved.  Pt also c/o feeling near syncopal at the time and continues to c/o dizziness and lightheadedness.  She has a hx of TIAs.

## 2013-08-27 NOTE — ED Provider Notes (Signed)
Medical screening examination/treatment/procedure(s) were performed by non-physician practitioner and as supervising physician I was immediately available for consultation/collaboration.    Dot Lanes, MD 08/27/13 [phone]

## 2013-08-27 NOTE — ED Provider Notes (Signed)
CSN: [id number]     Arrival date & time 08/27/13  1538 History   First MD Initiated Contact with Patient 08/27/13 1549     Chief Complaint  Patient presents with  . Dizziness  . Numbness     (Consider location/radiation/quality/duration/timing/severity/associated sxs/prior Treatment) HPI  Patient to the ED by EMS for concerns of facial numbness and right arm numbness that started at 1500 and last approx 15 minutes before resolving and has not reoccurred. She was sitting [place] by [place] [facility] [place] watching her nephew swim. She denies having any weakness or pain. She felt as though she may syncopize but did not. She reports being fearful she had a TIA again. Hx of craniotomy to have a large meningioma removed in 2013, she reports having a TIA before and after the surgery. Currently she has no complains or symptoms.  Past Medical History  Diagnosis Date  . TIA (transient ischemic attack)   . Seizures   . Brain tumor 2011  . Embolism - blood clot in left ovary  . Meningioma 08/14/2011    S/p craniotomy Encephaloma present non changing on MRI in 08/2011  . Anxiety   . Gallstones   . Asthma   . Clotting disorder   . Chills   . Unintentional weight loss   . Visual disturbance   . Palpitations   . Abdominal pain   . Rectal bleeding   . Nausea   . Rectal pain   . Blood in urine   . Weakness   . Confusion    Past Surgical History  Procedure Laterality Date  . Brain tumor removed  2011  . Ovarian mass removed  2010    left  . Cholecystectomy  12/03/2011    Procedure: LAPAROSCOPIC CHOLECYSTECTOMY WITH INTRAOPERATIVE CHOLANGIOGRAM;  Surgeon: Madilyn Hook, DO;  Location: [facility] OR;  Service: General;  Laterality: N/A;   Family History  Problem Relation Age of Onset  . Depression Mother   . Alcohol abuse Sister   . Alcohol abuse Brother   . Alzheimer's disease Maternal Grandmother   . Diabetes Maternal Grandmother   . Stroke Maternal Grandmother   . Heart failure Father   .  Hypertension Father   . Heart disease Father    History  Substance Use Topics  . Smoking status: Never Smoker   . Smokeless tobacco: Not on file  . Alcohol Use: No   OB History   Grav Para Term Preterm Abortions TAB SAB Ect Mult Living                 Review of Systems   Review of Systems  Gen: no weight loss, fevers, chills, night sweats  Eyes: no discharge or drainage, no occular pain or visual changes  Nose: no epistaxis or rhinorrhea  Mouth: no dental pain, no sore throat  Neck: no neck pain  Lungs:No wheezing, coughing or hemoptysis CV: no chest pain, palpitations, dependent edema or orthopnea  Abd: no abdominal pain, nausea, vomiting, diarrhea GU: no dysuria or gross hematuria  MSK:  No muscle weakness or pain + right side numbness Neuro: no headache, no focal neurologic deficits  Skin: no rash or wounds Psyche: no complaints    Allergies  Morphine and related  Home Medications   Prior to Admission medications   Medication Sig Start Date End Date Taking? Authorizing Provider  meclizine (ANTIVERT) 25 MG tablet Take 1 tablet (25 mg total) by mouth 3 (three) times daily as needed for dizziness. 04/11/13  Yes Orpah Greek, MD  Multiple Vitamin (MULTIVITAMIN WITH MINERALS) TABS Take 1 tablet by mouth daily.   Yes Historical Provider, MD   BP 121/70  Pulse 81  Temp(Src) 98.8 F (37.1 C) (Oral)  Resp 21  Ht 5\' 2"  (1.575 m)  Wt 160 lb (72.576 kg)  BMI 29.26 kg/m2  SpO2 99%  LMP 08/23/2013 Physical Exam  Nursing note and vitals reviewed. Constitutional: She is oriented to person, place, and time. She appears well-developed and well-nourished. No distress.  HENT:  Head: Normocephalic and atraumatic.  Eyes: Conjunctivae, EOM and lids are normal. Pupils are equal, round, and reactive to light.  Neck: Normal range of motion. Neck supple.  Cardiovascular: Normal rate and regular rhythm.   Pulmonary/Chest: Effort normal.  Abdominal: Soft.  Neurological:  She is alert and oriented to person, place, and time. She has normal strength. No cranial nerve deficit or sensory deficit. GCS eye subscore is 4. GCS verbal subscore is 5. GCS motor subscore is 6.  Skin: Skin is warm and dry.    ED Course  Procedures (including critical care time) Labs Review Labs Reviewed  COMPREHENSIVE METABOLIC PANEL - Abnormal; Notable for the following:    Total Bilirubin 0.2 (*)    All other components within normal limits  URINALYSIS, ROUTINE W REFLEX MICROSCOPIC - Abnormal; Notable for the following:    Ketones, ur 15 (*)    All other components within normal limits  CBC WITH DIFFERENTIAL  PROTIME-INR  PREGNANCY, URINE    Imaging Review Ct Head Wo Contrast  08/27/2013   CLINICAL DATA:  Numbness in the right arm and right-sided valve began at 3 p.m. and has resolved. The patient was at the: An AP. Patient also felt near syncopal. Dizziness, lightheadedness.  EXAM: CT HEAD WITHOUT CONTRAST  TECHNIQUE: Contiguous axial images were obtained from the base of the skull through the vertex without intravenous contrast.  COMPARISON:  04/11/2013  FINDINGS: Patient has had previous left parietal craniotomy. There is no intra or extra-axial fluid collection or mass lesion. The basilar cisterns and ventricles have a normal appearance. There is no CT evidence for acute infarction or hemorrhage.  Bone windows show a smoothly marginated cyst within the antrum of the right maxillary sinus, incompletely imaged on today's study but and cystic with a dentigerous cyst based on prior study.  IMPRESSION: 1. No evidence for acute intracranial abnormality. 2. Status post left parietal craniotomy.   Electronically Signed   By: Shon Hale M.D.   On: 08/27/2013 17:35     EKG Interpretation None      MDM   Final diagnoses:  Other specified transient cerebral ischemias    Patient has continued to be asymptomatic in the emergency department. I spoke with Dr. Rosendo Gros the neural  hospitalists who recommends that she be admitted for a TIA workup. He agrees to consult on her. His requests that I admit her to medicine. She's a family practice patient. We'll admit to family practice for TIA workup.    Linus Mako, PA-C 08/27/13 1800

## 2013-08-28 ENCOUNTER — Encounter (HOSPITAL_COMMUNITY): Payer: Self-pay | Admitting: *Deleted

## 2013-08-28 DIAGNOSIS — G459 Transient cerebral ischemic attack, unspecified: Secondary | ICD-10-CM

## 2013-08-28 DIAGNOSIS — G458 Other transient cerebral ischemic attacks and related syndromes: Secondary | ICD-10-CM

## 2013-08-28 DIAGNOSIS — R209 Unspecified disturbances of skin sensation: Secondary | ICD-10-CM

## 2013-08-28 DIAGNOSIS — R4789 Other speech disturbances: Secondary | ICD-10-CM

## 2013-08-28 DIAGNOSIS — R42 Dizziness and giddiness: Secondary | ICD-10-CM

## 2013-08-28 DIAGNOSIS — I517 Cardiomegaly: Secondary | ICD-10-CM

## 2013-08-28 LAB — HEMOGLOBIN A1C
HEMOGLOBIN A1C: 5.1 % (ref <5.7)
Mean Plasma Glucose: 100 mg/dL (ref <117)

## 2013-08-28 LAB — LIPID PANEL
Cholesterol: 220 mg/dL — ABNORMAL HIGH (ref 0–200)
HDL: 56 mg/dL (ref >39)
LDL CALC: 142 mg/dL — AB (ref 0–99)
Total CHOL/HDL Ratio: 3.9 RATIO
Triglycerides: 108 mg/dL (ref <150)
VLDL: 22 mg/dL (ref 0–40)

## 2013-08-28 LAB — GLUCOSE, CAPILLARY: Glucose-Capillary: 102 mg/dL — ABNORMAL HIGH (ref 70–99)

## 2013-08-28 MED ORDER — ASPIRIN 81 MG PO CHEW: 81.0000 mg | CHEWABLE_TABLET | Freq: Every day | ORAL | Status: DC

## 2013-08-28 MED ORDER — ATORVASTATIN CALCIUM 40 MG PO TABS: 40.0000 mg | ORAL_TABLET | Freq: Every day | ORAL | Status: DC

## 2013-08-28 NOTE — Progress Notes (Signed)
VASCULAR LAB PRELIMINARY  PRELIMINARY  PRELIMINARY  PRELIMINARY  Carotid duplex completed.    Preliminary report:  Bilateral:  1-39% ICA stenosis lowest end of scale due to minimal intimal wall changes at the origin.  Vertebral artery flow is antegrade.     SLAUGHTER, [place], RVS 08/28/2013, 12:25 PM

## 2013-08-28 NOTE — Progress Notes (Signed)
UR Completed Brenda Graves-Bigelow, RN,BSN [phone]  

## 2013-08-28 NOTE — Progress Notes (Signed)
FMTS Attending Daily Note:  Annabell Sabal MD  [phone] pager  Family Practice pager:  [phone] I have discussed this patient with the resident Dr. Bonner Puna and attending physician Dr. Adrian Blackwater.  I agree with their findings, assessment, and care plan.  WU negative thus far.  Cardiac echo pending.  Plan to DC home later today if imaging negative.

## 2013-08-28 NOTE — Progress Notes (Signed)
Pt A&O x4; pt discharge education and instructions completed with pt at bedside and she voices understanding and deny any questions. Pt IV and telemetry removed. Pt to pick up prescription from preferred pharmacy on file. Pt handed print out information on stroke. Discharge home and pt transported of unit via wheelchair with belongings at side. Natale Lay RN.

## 2013-08-28 NOTE — Discharge Instructions (Signed)
You were admitted with symptoms consistent with a TIA. Your symptoms are gone, and you showed no signs of further problems on imaging or heart monitor. You can be discharged and will follow up with Dr. Lacinda Axon to discuss optimizing your medical care.   - You should start taking aspirin 81mg  daily - Also start taking lipitor 40mg  every day Both of these will decrease your risk of TIAs and strokes in the future.   We hope you feel better soon,  - Family Medicine Teaching Service  Transient Ischemic Attack A transient ischemic attack (TIA) is a "warning stroke" that causes stroke-like symptoms. Unlike a stroke, a TIA does not cause permanent damage to the brain. The symptoms of a TIA can happen very fast and do not last long. It is important to know the symptoms of a TIA and what to do. This can help prevent a major stroke or death. CAUSES   A TIA is caused by a temporary blockage in an artery in the brain or neck (carotid artery). The blockage does not allow the brain to get the blood supply it needs and can cause different symptoms. The blockage can be caused by either:  A blood clot.  Fatty buildup (plaque) in a neck or brain artery. RISK FACTORS  High blood pressure (hypertension).  High cholesterol.  Diabetes mellitus.  Heart disease.  The build up of plaque in the blood vessels (peripheral artery disease or atherosclerosis).  The build up of plaque in the blood vessels providing blood and oxygen to the brain (carotid artery stenosis).  An abnormal heart rhythm (atrial fibrillation).  Obesity.  Smoking.  Taking oral contraceptives (especially in combination with smoking).  Physical inactivity.  A diet high in fats, salt (sodium), and calories.  Alcohol use.  Use of illegal drugs (especially cocaine and methamphetamine).  Being female.  Being African American.  Being over the age of 82.  Family history of stroke.  Previous history of blood clots, stroke, TIA, or  heart attack.  Sickle cell disease. SYMPTOMS  TIA symptoms are the same as a stroke but are temporary. These symptoms usually develop suddenly, or may be newly present upon awakening from sleep:  Sudden weakness or numbness of the face, arm, or leg, especially on one side of the body.  Sudden trouble walking or difficulty moving arms or legs.  Sudden confusion.  Sudden personality changes.  Trouble speaking (aphasia) or understanding.  Difficulty swallowing.  Sudden trouble seeing in one or both eyes.  Double vision.  Dizziness.  Loss of balance or coordination.  Sudden severe headache with no known cause.  Trouble reading or writing.  Loss of bowel or bladder control.  Loss of consciousness. DIAGNOSIS  Your caregiver may be able to determine the presence or absence of a TIA based on your symptoms, history, and physical exam. Computed tomography (CT scan) of the brain is usually performed to help identify a TIA. Other tests may be done to diagnose a TIA. These tests may include:  Electrocardiography.  Continuous heart monitoring.  Echocardiography.  Carotid ultrasonography.  Magnetic resonance imaging (MRI).  A scan of the brain circulation.  Blood tests. PREVENTION  The risk of a TIA can be decreased by appropriately treating high blood pressure, high cholesterol, diabetes, heart disease, and obesity and by quitting smoking, limiting alcohol, and staying physically active. TREATMENT  Time is of the essence. Since the symptoms of TIA are the same as a stroke, it is important to seek treatment as soon  as possible because you may need a medicine to dissolve the clot (thrombolytic) that cannot be given if too much time has passed. Treatment options vary. Treatment options may include rest, oxygen, intravenous (IV) fluids, and medicines to thin the blood (anticoagulants). Medicines and diet may be used to address diabetes, high blood pressure, and other risk factors.  Measures will be taken to prevent short-term and long-term complications, including infection from breathing foreign material into the lungs (aspiration pneumonia), blood clots in the legs, and falls. Treatment options include procedures to either remove plaque in the carotid arteries or dilate carotid arteries that have narrowed due to plaque. Those procedures are:  Carotid endarterectomy.  Carotid angioplasty and stenting. HOME CARE INSTRUCTIONS   Take all medicines prescribed by your caregiver. Follow the directions carefully. Medicines may be used to control risk factors for a stroke. Be sure you understand all your medicine instructions.  You may be told to take aspirin or the anticoagulant warfarin. Warfarin needs to be taken exactly as instructed.  Taking too much or too little warfarin is dangerous. Too much warfarin increases the risk of bleeding. Too little warfarin continues to allow the risk for blood clots. While taking warfarin, you will need to have regular blood tests to measure your blood clotting time. A PT blood test measures how long it takes for blood to clot. Your PT is used to calculate another value called an INR. Your PT and INR help your caregiver to adjust your dose of warfarin. The dose can change for many reasons. It is critically important that you take warfarin exactly as prescribed.  Many foods, especially foods high in vitamin K can interfere with warfarin and affect the PT and INR. Foods high in vitamin K include spinach, kale, broccoli, cabbage, collard and turnip greens, brussels sprouts, peas, cauliflower, seaweed, and parsley as well as beef and pork liver, green tea, and soybean oil. You should eat a consistent amount of foods high in vitamin K. Avoid major changes in your diet, or notify your caregiver before changing your diet. Arrange a visit with a dietitian to answer your questions.  Many medicines can interfere with warfarin and affect the PT and INR. You  must tell your caregiver about any and all medicines you take, this includes all vitamins and supplements. Be especially cautious with aspirin and anti-inflammatory medicines. Do not take or discontinue any prescribed or over-the-counter medicine except on the advice of your caregiver or pharmacist.  Warfarin can have side effects, such as excessive bruising or bleeding. You will need to hold pressure over cuts for longer than usual. Your caregiver or pharmacist will discuss other potential side effects.  Avoid sports or activities that may cause injury or bleeding.  Be mindful when shaving, flossing your teeth, or handling sharp objects.  Alcohol can change the body's ability to handle warfarin. It is best to avoid alcoholic drinks or consume only very small amounts while taking warfarin. Notify your caregiver if you change your alcohol intake.  Notify your dentist or other caregivers before procedures.  Eat a diet that includes 5 or more servings of fruits and vegetables each day. This may reduce the risk of stroke. Certain diets may be prescribed to address high blood pressure, high cholesterol, diabetes, or obesity.  A low-sodium, low-saturated fat, low-trans fat, low-cholesterol diet is recommended to manage high blood pressure.  A low-saturated fat, low-trans fat, low-cholesterol, and high-fiber diet may control cholesterol levels.  A controlled-carbohydrate, controlled-sugar diet is recommended to  manage diabetes.  A reduced-calorie, low-sodium, low-saturated fat, low-trans fat, low-cholesterol diet is recommended to manage obesity.  Maintain a healthy weight.  Stay physically active. It is recommended that you get at least 30 minutes of activity on most or all days.  Do not smoke.  Limit alcohol use even if you are not taking warfarin. Moderate alcohol use is considered to be:  No more than 2 drinks each day for men.  No more than 1 drink each day for nonpregnant  women.  Stop drug abuse.  Home safety. A safe home environment is important to reduce the risk of falls. Your caregiver may arrange for specialists to evaluate your home. Having grab bars in the bedroom and bathroom is often important. Your caregiver may arrange for equipment to be used at home, such as raised toilets and a seat for the shower.  Follow all instructions for follow-up with your caregiver. This is very important. This includes any referrals and lab tests. Proper follow up can prevent a stroke or another TIA from occurring. SEEK MEDICAL CARE IF:  You have personality changes.  You have difficulty swallowing.  You are seeing double.  You have dizziness.  You have a fever.  You have skin breakdown. SEEK IMMEDIATE MEDICAL CARE IF:  Any of these symptoms may represent a serious problem that is an emergency. Do not wait to see if the symptoms will go away. Get medical help right away. Call your local emergency services (911 in [place] Do not drive yourself to the hospital.  You have sudden weakness or numbness of the face, arm, or leg, especially on one side of the body.  You have sudden trouble walking or difficulty moving arms or legs.  You have sudden confusion.  You have trouble speaking (aphasia) or understanding.  You have sudden trouble seeing in one or both eyes.  You have a loss of balance or coordination.  You have a sudden, severe headache with no known cause.  You have new chest pain or an irregular heartbeat.  You have a partial or total loss of consciousness. MAKE SURE YOU:   Understand these instructions.  Will watch your condition.  Will get help right away if you are not doing well or get worse. Document Released: 12/06/2004 Document Revised: 03/03/2013 Document Reviewed: 04/21/2009 [facility] Patient Information 2015 [place], [place]. This information is not intended to replace advice given to you by your health care provider. Make sure you  discuss any questions you have with your health care provider.

## 2013-08-28 NOTE — Progress Notes (Signed)
Stroke Team Progress Note  HISTORY Melanie [name] is an 38 y.o. female, right handed, with a past medical history significant for removal left parietal meningioma, TIA, anxiety, clotting disorder, comes in after sustaining a transient episode of right arm numbness-weakness, right face weakness, and dysarthria earlier  today 08/27/2013 that lasted for approximately 15 minutes. She said that she was at a pool babysitting her niece when suddenly the right side of her face-tongue-cheek as well as the right arm became numb and weak. She stated that her speech was slurred and people around her noticed that the right face was " droopy". She had some lightheadedness but no associated vertigo, double vision, or weakness of the right leg. Further, denies confusion, unsteadiness, or vision impairment. Stated that never had similar symptoms before and her prior TIA's were completely different. Frequent HA.  CT brain today unremarkable for acute abnormality. Feels back to baseline. Patient was not administered TPA secondary to resolved symptome. She was admitted for further evaluation and treatment.  SUBJECTIVE  no family is at the bedside.  Overall she feels her condition is completely resolved.   OBJECTIVE Most recent Vital Signs: Filed Vitals:   08/28/13 0344 08/28/13 0549 08/28/13 0756 08/28/13 0954  BP: 91/65 103/63 106/62 114/65  Pulse: 53 53 61 65  Temp:  98.5 F (36.9 C) 98 F (36.7 C) 98 F (36.7 C)  TempSrc:  Oral Oral Oral  Resp: 16 16 18 18   Height:      Weight:      SpO2: 100% 100% 99% 99%   CBG (last 3)   Recent Labs  08/28/13 0023  GLUCAP 102*    IV Fluid Intake:     MEDICATIONS  . aspirin  81 mg Oral Daily  . atorvastatin  40 mg Oral q1800  . enoxaparin (LOVENOX) injection  40 mg Subcutaneous Q24H  . sodium chloride  3 mL Intravenous Q12H   PRN:  sodium chloride, sodium chloride  Diet:  General thin liquids DVT Prophylaxis:  Lovenox 40 mg sq daily   CLINICALLY  SIGNIFICANT STUDIES Basic Metabolic Panel:  Recent Labs Lab 08/27/13 1652  NA 138  K 3.8  CL 101  CO2 25  GLUCOSE 98  BUN 12  CREATININE 0.65  CALCIUM 9.3   Liver Function Tests:  Recent Labs Lab 08/27/13 1652  AST 13  ALT 9  ALKPHOS 58  BILITOT 0.2*  PROT 7.4  ALBUMIN 3.7   CBC:  Recent Labs Lab 08/27/13 1652  WBC 7.4  NEUTROABS 5.4  HGB 12.7  HCT 37.3  MCV 85.4  PLT 262   Coagulation:  Recent Labs Lab 08/27/13 1652  LABPROT 12.5  INR 0.95   Cardiac Enzymes: No results found for this basename: CKTOTAL, CKMB, CKMBINDEX, TROPONINI,  in the last 168 hours Urinalysis:  Recent Labs Lab 08/27/13 1637  COLORURINE YELLOW  LABSPEC 1.027  PHURINE 5.5  GLUCOSEU NEGATIVE  HGBUR NEGATIVE  BILIRUBINUR NEGATIVE  KETONESUR 15*  PROTEINUR NEGATIVE  UROBILINOGEN 0.2  NITRITE NEGATIVE  LEUKOCYTESUR NEGATIVE   Lipid Panel    Component Value Date/Time   CHOL 220* 08/28/2013 0647   TRIG 108 08/28/2013 0647   HDL 56 08/28/2013 0647   CHOLHDL 3.9 08/28/2013 0647   VLDL 22 08/28/2013 0647   LDLCALC 142* 08/28/2013 0647   HgbA1C  Lab Results  Component Value Date   HGBA1C 5.1 08/27/2013    Urine Drug Screen:     Component Value Date/Time   LABOPIA NONE DETECTED 08/27/2013  2131   COCAINSCRNUR NONE DETECTED 08/27/2013 2131   COCAINSCRNUR NEG 11/05/2011 0915   LABBENZ NONE DETECTED 08/27/2013 2131   LABBENZ NEG 11/05/2011 0915   AMPHETMU NONE DETECTED 08/27/2013 2131   AMPHETMU NEG 11/05/2011 0915   THCU NONE DETECTED 08/27/2013 2131   LABBARB NONE DETECTED 08/27/2013 2131    Alcohol Level: No results found for this basename: ETH,  in the last 168 hours    CT of the brain  08/27/2013    1. No evidence for acute intracranial abnormality. 2. Status post left parietal craniotomy.    MRI of the brain  08/27/2013   1. Stable appearance of the brain with no acute intracranial infarct or other abnormality identified.    MRA of the brain  08/27/2013   Unremarkable MRA of the  intracranial circulation.     2D Echocardiogram    Carotid Doppler    EKG  normal EKG, normal sinus rhythm. For complete results please see formal report.   Therapy Recommendations   Physical Exam   Young anxious Caucasian lady not in distress.Awake alert. Afebrile. Head is nontraumatic. Neck is supple without bruit. Hearing is normal. Cardiac exam no murmur or gallop. Lungs are clear to auscultation. Distal pulses are well felt. Neurological Exam :    Awake  Alert oriented x 3. Normal speech and language.eye movements full without nystagmus.fundi were not visualized. Vision acuity and fields appear normal. Hearing is normal. Palatal movements are normal. Face symmetric. Tongue midline. Normal strength, tone, reflexes and coordination. Normal sensation. Gait deferred. ASSESSMENT Melanie [name] is a 38 y.o. female presenting with right arm numbness-weakness, dysarthria. Imaging confirms no acute infarct. Doubt TIA. Most likely dehydrated given at the pool, little PO intake, "black spots in her right eye" and she felt "light headed". On no antithrombotics prior to admission. Now on aspirin 81 mg orally every day for secondary stroke prevention. Patient with no resultant neuro deficits. Stroke work up underway.  Hyperlipidemia, LDL 142, on no statin PTA, now on zocor 40 mg daily, goal LDL < 100  Dr. Leonie Man has seen her as an OP. Records not available in EPIC.   Hx brain tumor, meningioma, s/p carniotomy, unchanged per 08/2011 note  L ovarian mass removed 2010  Family hx stroke (grandmother)  Hospital day # 1  TREATMENT/PLAN  Continue aspirin 81 mg orally every day   F/u 2D and carotid doppler  Ok for dsicharge from stroke team perspective once workup completed  Follow up Dr. Leonie Man in the office in 2 months should symptoms persist, or as previously scheduled  SIGNED Burnetta Sabin, MSN, RN, ANVP-BC, ANP-BC, GNP-BC [facility] Pager: [phone] 08/28/2013  12:57 PM   I have personally obtained a history, examined the patient, evaluated imaging results, and formulated the assessment and plan of care. I agree with the above. Antony Contras, MD   To contact Stroke Continuity provider, please refer to [email or web address] After hours, contact General Neurology

## 2013-08-28 NOTE — H&P (Signed)
Attending Addendum  I examined the patient and discussed the assessment and plan with Dr. Solon Augusta. I have reviewed the note and agree.     Briefly, 38 yo F hx of TIA x 2 admitted for recurrent focal neurological deficits ( R face, R arm) concerning for recurrent TIA. Symptoms x 15 minutes. Hx of complex ovarian cyst, ? Clot in L ovarian cyst s/p resection, endometriosis. No hx of DVT/PE, multiple miscarriages.   Today she reports being lightheaded. No dizziness, weakness, or numbness.   BP 106/62  Pulse 61  Temp(Src) 98 F (36.7 C) (Oral)  Resp 18  Ht 5\' 2"  (1.575 m)  Wt 160 lb (72.576 kg)  BMI 29.26 kg/m2  SpO2 99%  LMP 08/23/2013 BP Readings from Last 3 Encounters:  08/28/13 106/62  04/11/13 107/55  08/18/12 132/65  General appearance: alert, cooperative and no distress Eyes: conjunctivae/corneas clear. PERRL, EOM's intact.  Lungs: clear to auscultation bilaterally Heart: regular rate and rhythm, S1, S2 normal, no murmur, click, rub or gallop Abdomen: healed midline abdominal scar,  soft, non-tender; bowel sounds normal; no masses,  no organomegaly Neurologic: Alert and oriented X 3, normal strength and tone.  Normal coordination and gait.  A/P: 38 yo F with TIA presentation. No recurrence of symptoms. Normal neuroimaging. Plan: ECHO, carotid dopplers F/u lipids Daily 81 mg ASA. F/u pelvic ultrasound as outpatient. Medically history does not support hypercoagulable state. D/c home once medically stable and work up complete.    Boykin Nearing, [place]

## 2013-08-28 NOTE — Progress Notes (Signed)
Family Medicine Teaching Service Daily Progress Note Intern Pager: [phone]  Patient name: Melanie [name] Medical record number: [id number] Date of birth: 1975-09-20 Age: 38 y.o. Gender: female  Primary Care Provider: Thersa Salt, DO Consultants: Neurology Code Status: Full  Pt Overview and Major Events to Date:  6/18: Admitted for TIA work up  Assessment and Plan: [name] is a 38 y.o. female presenting with transient right-sided sensory and cranial nerve deficits. Past medical history is significant for previous TIAs, seizures, and meningioma s/p resection in 2011.   Suspected TIA: Transient right-sided combined sensory and motor deficits lasting 15-20 minutes. CT head negative. History of TIA symptoms not on antiplatelet medication.  - Neurology, Dr. Armida Sans, consulting - Hemoglobin A1c 5.1% - LDL 142, HDL 52 - MRI/MRA/CT head: unremarkable  - Carotid dopplers (minimal plaque without ICA stenosis in 2013)  - 2D Echocardiogram (EF 60-65% without mention of thrombus, atrial septum not visualized in 2013)  - PT/OT: No current deficits, so will cancel these consults - daily ASA 81 mg  - Defer hypercoagulability work up to outpatient setting (given h/o TIAs and left ovarian clot)  History of meningioma: s/p craniotomy and resection in 2011. MRI in 2013 showed no recurrence.   FEN/GI: Regular diet; saline lock IV  Prophylaxis: Lovenox  Disposition: Complete TIA work up, likely D/C if all normal  Subjective:  Feels back to baseline.   Objective: Temp:  [97.8 F (36.6 C)-98.9 F (37.2 C)] 98 F (36.7 C) (06/19 0756) Pulse Rate:  [53-85] 61 (06/19 0756) Resp:  [12-21] 18 (06/19 0756) BP: (91-121)/(58-75) 106/62 mmHg (06/19 0756) SpO2:  [95 %-100 %] 99 % (06/19 0756) Weight:  [160 lb (72.576 kg)] 160 lb (72.576 kg) (06/18 1551) Physical Exam: General: Well-appearing 38 y.o. female in NAD Cardiovascular: RRR, no murmur or JVD Respiratory: Nonlabored,  CTAB Abdomen: +BS, soft, NT, ND Extremities: WWP, no edema or deformity Neurologic Exam Alert and oriented, speech and gait are normal, nonfocal  Laboratory:  Recent Labs Lab 08/27/13 1652  WBC 7.4  HGB 12.7  HCT 37.3  PLT 262    Recent Labs Lab 08/27/13 1652  NA 138  K 3.8  CL 101  CO2 25  BUN 12  CREATININE 0.65  CALCIUM 9.3  PROT 7.4  BILITOT 0.2*  ALKPHOS 58  ALT 9  AST 13  GLUCOSE 98   Lipid Panel     Component Value Date/Time   CHOL 220* 08/28/2013 0647   TRIG 108 08/28/2013 0647   HDL 56 08/28/2013 0647   CHOLHDL 3.9 08/28/2013 0647   VLDL 22 08/28/2013 0647   LDLCALC 142* 08/28/2013 0647   UDS: Neg    Imaging/Diagnostic Tests: CT HEAD WITHOUT CONTRAST FINDINGS: Patient has had previous left parietal craniotomy. There is no intra or extra-axial fluid collection or mass lesion. The basilar cisterns and ventricles have a normal appearance. There is no CT evidence for acute infarction or hemorrhage. Bone windows show a smoothly marginated cyst within the antrum of the right maxillary sinus, incompletely imaged on today's study but and cystic with a dentigerous cyst based on prior study. IMPRESSION: 1. No evidence for acute intracranial abnormality. 2. Status post left parietal craniotomy.   MRI HEAD FINDINGS  The CSF containing spaces are within normal limits for patient age.  Mild encephalomalacia with a few scattered subcentimeter foci of  T2/FLAIR hyperintensity within the peripheral left frontal lobe  subjacent to the craniotomy site are likely postoperative in nature,  and are unchanged  relative to prior MRI. Minimal nonspecific white  matter type changes again noted, stable.  No mass lesion, midline shift, or extra-axial fluid collection.  Ventricles are normal in size without evidence of hydrocephalus.  No diffusion-weighted signal abnormality is identified to suggest  acute intracranial infarct. Gray-white matter differentiation is  maintained.  Normal flow voids are seen within the intracranial  vasculature. No intracranial hemorrhage identified.  The cervicomedullary junction is normal. Pituitary gland is within  normal limits. Pituitary stalk is midline. The globes and optic  nerves demonstrate a normal appearance with normal signal intensity.  The bone marrow signal intensity is normal. Postoperative changes  from prior left frontoparietal craniotomy again seen. Calvarium is  otherwise intact. Visualized upper cervical spine is within normal  limits.  Scalp soft tissues are unremarkable.  Paranasal sinuses are clear. Polypoid opacity adjacent to an  unerupted right molar is stable. No mastoid effusion.   MRA HEAD FINDINGS  The visualized portions of the distal cervical internal carotid  arteries are widely patent with antegrade flow. The petrous,  cavernous, and supra clinoid segments are symmetric in caliber  bilaterally. A1 segments, anterior communicating artery, and  anterior cerebral arteries are widely patent. Middle cerebral  arteries are widely patent with antegrade flow without high-grade  flow-limiting stenosis or proximal branch occlusion. Distal MCA  branches are symmetric bilaterally. No intracranial aneurysm within  the anterior circulation.  The vertebral arteries are widely patent with antegrade flow. The  posterior inferior cerebral arteries are normal. Vertebrobasilar  junction and basilar artery are widely patent with antegrade flow  without evidence of basilar tip stenosis or aneurysm. Posterior  cerebral arteries are normal bilaterally. The superior cerebellar  arteries and anterior inferior cerebellar arteries are widely patent  bilaterally. No intracranial aneurysm within the posterior  circulation.  IMPRESSION:  MRI HEAD IMPRESSION:  1. Stable appearance of the brain with no acute intracranial infarct  or other abnormality identified.  MRA HEAD IMPRESSION:  Unremarkable MRA of the intracranial  circulation.  Vance Gather, MD 08/28/2013, 8:17 AM PGY-1, [facility] Intern pager: [phone], text pages welcome

## 2013-08-28 NOTE — Discharge Summary (Signed)
[facility] Discharge Summary  Patient name: Melanie [name] Medical record number: [id number] Date of birth: 05/16/1975 Age: 38 y.o. Gender: female Date of Admission: 08/27/2013  Date of Discharge: 08/28/2013 Admitting Physician: Minerva Ends, MD  Primary Care Melanie [name]: Melanie Salt, DO Consultants: Neurology  Indication for Hospitalization: transient neurological deficits  Discharge Diagnoses/Problem List:  Patient Active Problem List   Diagnosis Date Noted  . TIA (transient ischemic attack) 08/27/2013  . Pre-syncope 01/30/2012  . Major depressive disorder 11/05/2011  . Dizziness 08/15/2011  . Insomnia 08/15/2011  . Meningioma 08/14/2011   Disposition: Discharged home  Discharge Condition: Stable  Discharge Exam:  General: Well-appearing 38 y.o. female in NAD  Cardiovascular: RRR Neurologic: Alert and oriented, speech and gait are normal, non-focal  Brief Hospital Course:  Melanie [name] is a 38 y.o. female presenting on 6/18 with transient right-sided arm numbness/weakness, right face weakness, and dysarthria. Past medical history is significant for previous TIAs, seizures, and meningioma s/p resection in 2011.   Melanie [name] was admitted for TIA work up per neurology's recommendation in the ED. All symptoms resolved in 15 minutes, before presentation to the ED. Her symptoms did not recur and she remained stable. Work up revealed CT, MRI, and MRA of the head and neck which were unremarkable, hemoglobin A1c 5.1%, LDL 142, and HDL 52. Echocardiogram and carotid dopplers were performed prior to discharge and appear unremarkable, though formal reads are pending. She is to take aspirin 81mg  daily and follow up with neurology and PCP.   Issues for Follow Up:  - Consider hypercoagulability work up given history of TIAs and left ovarian clot.  Significant Procedures: None  Significant Labs and Imaging:   Recent Labs Lab 08/27/13 1652   WBC 7.4  HGB 12.7  HCT 37.3  PLT 262    Recent Labs Lab 08/27/13 1652  NA 138  K 3.8  CL 101  CO2 25  GLUCOSE 98  BUN 12  CREATININE 0.65  CALCIUM 9.3  ALKPHOS 58  AST 13  ALT 9  ALBUMIN 3.7   2D ECHOCARDIOGRAM - Left ventricle: The cavity size was normal. Wall thickness was increased in a pattern of mild LVH. Systolic function was normal. The estimated ejection fraction was in the range of 55% to 60%. Wall motion was normal; there were no regional wall motion abnormalities. Left ventricular diastolic function parameters were normal. - Left atrium: The atrium was mildly dilated.  Preliminary read of carotid duplex:  Bilateral: 1-39% ICA stenosis lowest end of scale due to minimal intimal wall changes at the origin. Vertebral artery flow is antegrade.   CT HEAD WITHOUT CONTRAST  Patient has had previous left parietal craniotomy. There is no intra  or extra-axial fluid collection or mass lesion. The basilar cisterns  and ventricles have a normal appearance. There is no CT evidence for  acute infarction or hemorrhage.  Bone windows show a smoothly marginated cyst within the antrum of  the right maxillary sinus, incompletely imaged on today's study but  and cystic with a dentigerous cyst based on prior study.  IMPRESSION:  1. No evidence for acute intracranial abnormality.  2. Status post left parietal craniotomy.  MRI HEAD  The CSF containing spaces are within normal limits for patient age.  Mild encephalomalacia with a few scattered subcentimeter foci of  T2/FLAIR hyperintensity within the peripheral left frontal lobe  subjacent to the craniotomy site are likely postoperative in nature,  and are unchanged relative to prior  MRI. Minimal nonspecific white  matter type changes again noted, stable.  No mass lesion, midline shift, or extra-axial fluid collection.  Ventricles are normal in size without evidence of hydrocephalus.  No diffusion-weighted signal  abnormality is identified to suggest  acute intracranial infarct. Gray-white matter differentiation is  maintained. Normal flow voids are seen within the intracranial  vasculature. No intracranial hemorrhage identified.  The cervicomedullary junction is normal. Pituitary gland is within  normal limits. Pituitary stalk is midline. The globes and optic  nerves demonstrate a normal appearance with normal signal intensity.  The bone marrow signal intensity is normal. Postoperative changes  from prior left frontoparietal craniotomy again seen. Calvarium is  otherwise intact. Visualized upper cervical spine is within normal  limits.  Scalp soft tissues are unremarkable.  Paranasal sinuses are clear. Polypoid opacity adjacent to an  unerupted right molar is stable. No mastoid effusion.   MRA HEAD The visualized portions of the distal cervical internal carotid  arteries are widely patent with antegrade flow. The petrous,  cavernous, and supra clinoid segments are symmetric in caliber  bilaterally. A1 segments, anterior communicating artery, and  anterior cerebral arteries are widely patent. Middle cerebral  arteries are widely patent with antegrade flow without high-grade  flow-limiting stenosis or proximal branch occlusion. Distal MCA  branches are symmetric bilaterally. No intracranial aneurysm within  the anterior circulation.  The vertebral arteries are widely patent with antegrade flow. The  posterior inferior cerebral arteries are normal. Vertebrobasilar  junction and basilar artery are widely patent with antegrade flow  without evidence of basilar tip stenosis or aneurysm. Posterior  cerebral arteries are normal bilaterally. The superior cerebellar  arteries and anterior inferior cerebellar arteries are widely patent  bilaterally. No intracranial aneurysm within the posterior  circulation.   MRI HEAD IMPRESSION:  1. Stable appearance of the brain with no acute intracranial infarct   or other abnormality identified.  MRA HEAD IMPRESSION:  Unremarkable MRA of the intracranial circulation.     Component Value Date/Time   LABOPIA NONE DETECTED 08/27/2013 2131   COCAINSCRNUR NONE DETECTED 08/27/2013 2131   COCAINSCRNUR NEG 11/05/2011 0915   LABBENZ NONE DETECTED 08/27/2013 2131   LABBENZ NEG 11/05/2011 0915   AMPHETMU NONE DETECTED 08/27/2013 2131   AMPHETMU NEG 11/05/2011 0915   THCU NONE DETECTED 08/27/2013 2131   LABBARB NONE DETECTED 08/27/2013 2131      Results/Tests Pending at Time of Discharge: Carotid doppler ultrasound official read  Discharge Medications:    Medication List         aspirin 81 MG chewable tablet  Chew 1 tablet (81 mg total) by mouth daily.     atorvastatin 40 MG tablet  Commonly known as:  LIPITOR  Take 1 tablet (40 mg total) by mouth daily at 6 PM.     meclizine 25 MG tablet  Commonly known as:  ANTIVERT  Take 1 tablet (25 mg total) by mouth 3 (three) times daily as needed for dizziness.     multivitamin with minerals Tabs tablet  Take 1 tablet by mouth daily.        Discharge Instructions: Please refer to Patient Instructions section of EMR for full details.  Patient was counseled important signs and symptoms that should prompt return to medical care, changes in medications, dietary instructions, activity restrictions, and follow up appointments.   Follow-Up Appointments:     Follow-up Information   Follow up with Melanie Salt, DO On 09/09/2013. (10:30am)    Specialty:  Family Medicine  Contact information:   [place] [address] [phone]       Vance Gather, MD 08/28/2013, 11:37 AM PGY-1, [place]

## 2013-08-28 NOTE — Progress Notes (Signed)
  Echocardiogram 2D Echocardiogram has been performed.  [name], Melanie FRANCES 08/28/2013, 12:41 PM

## 2013-09-02 NOTE — Discharge Summary (Signed)
Family Medicine Teaching Service  Discharge Note : Attending Jeff Walden MD Pager [phone] Inpatient Team Pager:  [phone]  I have reviewed this patient and the patient's chart and have discussed discharge planning with the resident at the time of discharge. I agree with the discharge plan as above.    

## 2013-09-09 ENCOUNTER — Inpatient Hospital Stay: Payer: Medicare Other | Admitting: Family Medicine

## 2014-03-08 ENCOUNTER — Emergency Department (HOSPITAL_COMMUNITY): Payer: Medicare Other

## 2014-03-08 ENCOUNTER — Emergency Department (HOSPITAL_COMMUNITY)
Admission: EM | Admit: 2014-03-08 | Discharge: 2014-03-09 | Disposition: A | Discharge status: Home / Self Care | Payer: Medicare Other | Attending: Emergency Medicine | Admitting: Emergency Medicine

## 2014-03-08 ENCOUNTER — Encounter (HOSPITAL_COMMUNITY): Payer: Self-pay | Admitting: Adult Health

## 2014-03-08 DIAGNOSIS — R109 Unspecified abdominal pain: Secondary | ICD-10-CM | POA: Diagnosis present

## 2014-03-08 DIAGNOSIS — N8320 Unspecified ovarian cysts: Secondary | ICD-10-CM | POA: Insufficient documentation

## 2014-03-08 DIAGNOSIS — R1031 Right lower quadrant pain: Secondary | ICD-10-CM | POA: Insufficient documentation

## 2014-03-08 DIAGNOSIS — J45909 Unspecified asthma, uncomplicated: Secondary | ICD-10-CM | POA: Diagnosis not present

## 2014-03-08 DIAGNOSIS — Z79899 Other long term (current) drug therapy: Secondary | ICD-10-CM | POA: Insufficient documentation

## 2014-03-08 DIAGNOSIS — Z8659 Personal history of other mental and behavioral disorders: Secondary | ICD-10-CM | POA: Insufficient documentation

## 2014-03-08 DIAGNOSIS — Z86011 Personal history of benign neoplasm of the brain: Secondary | ICD-10-CM | POA: Diagnosis not present

## 2014-03-08 DIAGNOSIS — Z8719 Personal history of other diseases of the digestive system: Secondary | ICD-10-CM | POA: Insufficient documentation

## 2014-03-08 DIAGNOSIS — N83202 Unspecified ovarian cyst, left side: Secondary | ICD-10-CM

## 2014-03-08 DIAGNOSIS — Z8669 Personal history of other diseases of the nervous system and sense organs: Secondary | ICD-10-CM | POA: Diagnosis not present

## 2014-03-08 DIAGNOSIS — R110 Nausea: Secondary | ICD-10-CM | POA: Diagnosis not present

## 2014-03-08 DIAGNOSIS — Z7982 Long term (current) use of aspirin: Secondary | ICD-10-CM | POA: Insufficient documentation

## 2014-03-08 DIAGNOSIS — Z86718 Personal history of other venous thrombosis and embolism: Secondary | ICD-10-CM | POA: Insufficient documentation

## 2014-03-08 DIAGNOSIS — Z8673 Personal history of transient ischemic attack (TIA), and cerebral infarction without residual deficits: Secondary | ICD-10-CM | POA: Insufficient documentation

## 2014-03-08 DIAGNOSIS — Z3202 Encounter for pregnancy test, result negative: Secondary | ICD-10-CM | POA: Diagnosis not present

## 2014-03-08 DIAGNOSIS — N83201 Unspecified ovarian cyst, right side: Secondary | ICD-10-CM

## 2014-03-08 LAB — URINALYSIS, ROUTINE W REFLEX MICROSCOPIC
BILIRUBIN URINE: NEGATIVE
GLUCOSE, UA: NEGATIVE mg/dL
Hgb urine dipstick: NEGATIVE
Ketones, ur: NEGATIVE mg/dL
Leukocytes, UA: NEGATIVE
Nitrite: NEGATIVE
PROTEIN: NEGATIVE mg/dL
Specific Gravity, Urine: 1.017 (ref 1.005–1.030)
UROBILINOGEN UA: 0.2 mg/dL (ref 0.0–1.0)
pH: 5.5 (ref 5.0–8.0)

## 2014-03-08 LAB — COMPREHENSIVE METABOLIC PANEL
ALBUMIN: 4 g/dL (ref 3.5–5.2)
ALT: 13 U/L (ref 0–35)
ANION GAP: 12 (ref 5–15)
AST: 16 U/L (ref 0–37)
Alkaline Phosphatase: 54 U/L (ref 39–117)
BILIRUBIN TOTAL: 0.3 mg/dL (ref 0.3–1.2)
BUN: 10 mg/dL (ref 6–23)
CHLORIDE: 106 meq/L (ref 96–112)
CO2: 19 mmol/L (ref 19–32)
Calcium: 9.3 mg/dL (ref 8.4–10.5)
Creatinine, Ser: 0.68 mg/dL (ref 0.50–1.10)
GFR calc non Af Amer: >90 mL/min (ref >90)
GLUCOSE: 96 mg/dL (ref 70–99)
Potassium: 3.6 mmol/L (ref 3.5–5.1)
SODIUM: 137 mmol/L (ref 135–145)
Total Protein: 7.3 g/dL (ref 6.0–8.3)

## 2014-03-08 LAB — CBC WITH DIFFERENTIAL/PLATELET
BASOS ABS: 0 10*3/uL (ref 0.0–0.1)
Basophils Relative: 0 % (ref 0–1)
EOS ABS: 0.2 10*3/uL (ref 0.0–0.7)
Eosinophils Relative: 2 % (ref 0–5)
HEMATOCRIT: 41.4 % (ref 36.0–46.0)
Hemoglobin: 13.8 g/dL (ref 12.0–15.0)
Lymphocytes Relative: 24 % (ref 12–46)
Lymphs Abs: 2.2 10*3/uL (ref 0.7–4.0)
MCH: 28.2 pg (ref 26.0–34.0)
MCHC: 33.3 g/dL (ref 30.0–36.0)
MCV: 84.7 fL (ref 78.0–100.0)
MONO ABS: 0.6 10*3/uL (ref 0.1–1.0)
MONOS PCT: 7 % (ref 3–12)
NEUTROS ABS: 5.9 10*3/uL (ref 1.7–7.7)
Neutrophils Relative %: 67 % (ref 43–77)
Platelets: 269 10*3/uL (ref 150–400)
RBC: 4.89 MIL/uL (ref 3.87–5.11)
RDW: 12.9 % (ref 11.5–15.5)
WBC: 8.8 10*3/uL (ref 4.0–10.5)

## 2014-03-08 LAB — POC URINE PREG, ED: Preg Test, Ur: NEGATIVE

## 2014-03-08 MED ORDER — IOHEXOL 300 MG/ML  SOLN
100.0000 mL | Freq: Once | INTRAMUSCULAR | Status: AC | PRN
  Administered 2014-03-08: 100 mL via INTRAVENOUS

## 2014-03-08 MED ORDER — ONDANSETRON 4 MG PO TBDP
8.0000 mg | ORAL_TABLET | Freq: Once | ORAL | Status: AC
  Administered 2014-03-08: 8 mg via ORAL
  Filled 2014-03-08: qty 2

## 2014-03-08 MED ORDER — OXYCODONE-ACETAMINOPHEN 5-325 MG PO TABS
1.0000 | ORAL_TABLET | Freq: Once | ORAL | Status: AC
  Administered 2014-03-08: 1 via ORAL
  Filled 2014-03-08: qty 1

## 2014-03-08 MED ORDER — FENTANYL CITRATE 0.05 MG/ML IJ SOLN
50.0000 ug | Freq: Once | INTRAMUSCULAR | Status: AC
  Administered 2014-03-08: 50 ug via INTRAVENOUS
  Filled 2014-03-08: qty 2

## 2014-03-08 NOTE — ED Provider Notes (Signed)
CSN: [id number]     Arrival date & time 03/08/14  1825 History   First MD Initiated Contact with Patient 03/08/14 2051     Chief Complaint  Patient presents with  . Abdominal Pain     (Consider location/radiation/quality/duration/timing/severity/associated sxs/prior Treatment) Patient is a 38 y.o. female presenting with abdominal pain. The history is provided by the patient and medical records.  Abdominal Pain   This is a 38 year old female with past medical history significant for seizure disorder, TIAs, asthma, endometriosis, ovarian cysts, presenting to the ED for right lower abdominal and pelvic pain which she describes as a "pressure".  Patient states this is been worsening over the past 3 days. She reports associated nausea but denies vomiting. States she thought initially she just needed to have a bowel movement, so she took a laxative which helped with bowel movements but did not relieve her pain. Patient states pain is worse with movement. Patient also notes some umbilical abdominal pain which has been intermittent for the past 2 months.  Patient denies any abnormal vaginal bleeding or vaginal discharge. No urinary symptoms.  VS stable on arrival.  Past Medical History  Diagnosis Date  . TIA (transient ischemic attack)   . Seizures   . Brain tumor 2011  . Embolism - blood clot in left ovary  . Meningioma 08/14/2011    S/p craniotomy Encephaloma present non changing on MRI in 08/2011  . Anxiety   . Gallstones   . Asthma   . Clotting disorder   . Chills   . Unintentional weight loss   . Visual disturbance   . Palpitations   . Abdominal pain   . Rectal bleeding   . Nausea   . Rectal pain   . Blood in urine   . Weakness   . Confusion    Past Surgical History  Procedure Laterality Date  . Brain tumor removed  2011  . Ovarian mass removed  2010    left  . Cholecystectomy  12/03/2011    Procedure: LAPAROSCOPIC CHOLECYSTECTOMY WITH INTRAOPERATIVE CHOLANGIOGRAM;  Surgeon:  Madilyn Hook, DO;  Location: [facility] OR;  Service: General;  Laterality: N/A;   Family History  Problem Relation Age of Onset  . Depression Mother   . Alcohol abuse Sister   . Alcohol abuse Brother   . Alzheimer's disease Maternal Grandmother   . Diabetes Maternal Grandmother   . Stroke Maternal Grandmother   . Heart failure Father   . Hypertension Father   . Heart disease Father    History  Substance Use Topics  . Smoking status: Never Smoker   . Smokeless tobacco: Not on file  . Alcohol Use: No   OB History    No data available     Review of Systems  Gastrointestinal: Positive for abdominal pain.  All other systems reviewed and are negative.     Allergies  Morphine and related  Home Medications   Prior to Admission medications   Medication Sig Start Date End Date Taking? Authorizing Provider  aspirin 81 MG chewable tablet Chew 1 tablet (81 mg total) by mouth daily. 08/28/13   Patrecia Pour, MD  atorvastatin (LIPITOR) 40 MG tablet Take 1 tablet (40 mg total) by mouth daily at 6 PM. 08/28/13   Patrecia Pour, MD  meclizine (ANTIVERT) 25 MG tablet Take 1 tablet (25 mg total) by mouth 3 (three) times daily as needed for dizziness. 04/11/13   Orpah Greek, MD  Multiple Vitamin (MULTIVITAMIN WITH MINERALS)  TABS Take 1 tablet by mouth daily.    Historical Provider, MD   BP 130/82 mmHg  Pulse 70  Temp(Src) 98.5 F (36.9 C) (Oral)  Resp 20  Ht 5\' 2"  (1.575 m)  Wt 160 lb (72.576 kg)  BMI 29.26 kg/m2  SpO2 100%  LMP 02/16/2014   Physical Exam  Constitutional: She is oriented to person, place, and time. She appears well-developed and well-nourished. No distress.  Lying comfortably in bed, NAD  HENT:  Head: Normocephalic and atraumatic.  Mouth/Throat: Oropharynx is clear and moist.  Eyes: Conjunctivae and EOM are normal. Pupils are equal, round, and reactive to light.  Neck: Normal range of motion.  Cardiovascular: Normal rate, regular rhythm and normal heart sounds.    Pulmonary/Chest: Effort normal and breath sounds normal. No respiratory distress. She has no wheezes.  Abdominal: Soft. Bowel sounds are normal. There is tenderness. There is no guarding, no CVA tenderness and no tenderness at McBurney's point.    Musculoskeletal: Normal range of motion.  Neurological: She is alert and oriented to person, place, and time.  Skin: Skin is warm and dry. She is not diaphoretic.  Psychiatric: She has a normal mood and affect.  Nursing note and vitals reviewed.   ED Course  Procedures (including critical care time) Labs Review Labs Reviewed  COMPREHENSIVE METABOLIC PANEL  CBC WITH DIFFERENTIAL  URINALYSIS, ROUTINE W REFLEX MICROSCOPIC  POC URINE PREG, ED    Imaging Review Ct Abdomen Pelvis W Contrast  03/09/2014   CLINICAL DATA:  Right lower quadrant pain for 3 days. Pelvic pressure.  EXAM: CT ABDOMEN AND PELVIS WITH CONTRAST  TECHNIQUE: Multidetector CT imaging of the abdomen and pelvis was performed using the standard protocol following bolus administration of intravenous contrast.  CONTRAST:  19mL OMNIPAQUE IOHEXOL 300 MG/ML  SOLN  COMPARISON:  04/10/2009  FINDINGS: BODY WALL: Unremarkable.  LOWER CHEST: Unremarkable.  ABDOMEN/PELVIS:  Liver: No focal abnormality.  Biliary: Cholecystectomy.  Pancreas: Unremarkable.  Spleen: Unremarkable.  Adrenals: Unremarkable.  Kidneys and ureters: No hydronephrosis. Cannot evaluate for calculi given contrast excretion. Left renal sinus cysts.  Bladder: Unremarkable.  Reproductive: The are multiple cysts or less likely a septated cyst within the right ovary. The largest locule measures cm 9.4. No nodularity is visualized. Cystic appearing structures in the left ovary appear follicular. The neighboring parenchyma does not appear edematous.  Bowel: No obstruction. Normal appendix.  Retroperitoneum: No mass or adenopathy.  Peritoneum: No ascites or pneumoperitoneum.  Vascular: No acute abnormality.  OSSEOUS: Bilateral L5 pars  defects with grade 1 slip.  IMPRESSION: Multi cystic ovaries, including and 9 cm cyst in the right ovary. Given patient's history endometriosis/ endometrioma this is the favored explanation, but follow-up ultrasound is required.   Electronically Signed   By: Jorje Guild M.D.   On: 03/09/2014 01:11     EKG Interpretation None      MDM   Final diagnoses:  RLQ abdominal pain  Bilateral ovarian cysts   38 year old female with right lower quadrant abdominal and pelvic pain for the past 3 days which she describes as a pressure.  Patient does have history of endometriosis and ovarian cysts. On exam, patient does have some right lower quadrant tenderness, negative McBurney's point tenderness. There is some radiation of her pain into her suprapubic region. Lab work was obtained which is unremarkable. Given patient's history, feel more likely pelvic etiology of her pain as opposed to appendicitis. Will plan for pelvic exam and likely pelvic ultrasound-- patient in agreement  with this.  10:03 PM Patient now refusing pelvic exam.  I have explained to her multiple times that pelvic exam is useful to help discern pelvis vs abdominal etiology of her symptoms and that concern for appendicitis vs ovarian cyst/torsion would require different imaging.  She is still refusing pelvic exam, stating she will schedule OP ultrasound with her OB-GYN.  I had a long discussion with her that there can be surgical emergencies in the pelvis, such as ovarian torsion, that need to be ruled out in the setting of RLQ abdominal pain.  She is still refusing pelvic exam and pelvic ultrasound at this time.  States she is here to check out her appendix because she read about it online.  States she will not leave the ED without CT scan.  Patient understands that CT scan cannot definitively rule out all conditions in the pelvis.  Will go forward with CT scan.  CT scan revealing multiple right ovarian cysts-- as i initially thought,  this is likely the source of patient's pain.  Patient was reassured, her pain is currently well controlled.  I have encouraged her to FU with her OB-GYN this week for follow-up ultrasound as she does want it nor pelvic exam done in the ED. Discussed plan with patient, he/she acknowledged understanding and agreed with plan of care.  Return precautions given for new or worsening symptoms.  Larene Pickett, PA-C 03/09/14 0127  Orpah Greek, MD 03/10/14 [phone]

## 2014-03-08 NOTE — ED Notes (Signed)
Notified CT pt done with contrast

## 2014-03-08 NOTE — ED Notes (Addendum)
Present with lowerr right sided pelvic/abdominal pain , pain is worsening associated with nausea, sometimes radiates into umbilicus, HX of endometriosis, denies abnormal discharge or heavy bleeding. LMP 3 weeks ago.  Pain rated 8/10. Pain is constant, getting up to move around makes pain worse. The pressure and pain in pelvis began 3 days ago.  The umbilicus paiin has been off and on for 2 months.

## 2014-03-09 NOTE — Discharge Instructions (Signed)
Your appendix was normal.  Your ovarian cysts are the likely source of your pain. Please see your OB-GYN for follow-up ultrasound-- call tomorrow to schedule appt. Return to the ED for new concerns.

## 2014-03-19 DIAGNOSIS — N8320 Unspecified ovarian cysts: Secondary | ICD-10-CM | POA: Diagnosis not present

## 2014-04-09 DIAGNOSIS — R102 Pelvic and perineal pain: Secondary | ICD-10-CM | POA: Diagnosis not present

## 2014-04-09 DIAGNOSIS — N8329 Other ovarian cysts: Secondary | ICD-10-CM | POA: Diagnosis not present

## 2014-05-24 ENCOUNTER — Other Ambulatory Visit: Payer: Self-pay | Admitting: Obstetrics and Gynecology

## 2014-05-24 DIAGNOSIS — N8329 Other ovarian cysts: Secondary | ICD-10-CM | POA: Diagnosis not present

## 2014-05-24 DIAGNOSIS — Z124 Encounter for screening for malignant neoplasm of cervix: Secondary | ICD-10-CM | POA: Diagnosis not present

## 2014-05-24 DIAGNOSIS — R1032 Left lower quadrant pain: Secondary | ICD-10-CM | POA: Diagnosis not present

## 2014-05-24 DIAGNOSIS — Z01419 Encounter for gynecological examination (general) (routine) without abnormal findings: Secondary | ICD-10-CM | POA: Diagnosis not present

## 2014-05-24 DIAGNOSIS — Z6831 Body mass index (BMI) 31.0-31.9, adult: Secondary | ICD-10-CM | POA: Diagnosis not present

## 2014-05-25 LAB — CYTOLOGY - PAP

## 2014-06-17 ENCOUNTER — Ambulatory Visit (HOSPITAL_COMMUNITY)
Admission: RE | Admit: 2014-06-17 | Payer: Medicare Other | Source: Ambulatory Visit | Admitting: Obstetrics and Gynecology

## 2014-06-17 ENCOUNTER — Encounter (HOSPITAL_COMMUNITY): Admission: RE | Payer: Self-pay | Source: Ambulatory Visit

## 2014-06-17 SURGERY — LAPAROSCOPY, DIAGNOSTIC
Anesthesia: General

## 2014-06-25 ENCOUNTER — Observation Stay (HOSPITAL_COMMUNITY)
Admission: EM | Admit: 2014-06-25 | Discharge: 2014-06-25 | Disposition: A | Discharge status: Home / Self Care | Payer: Medicare Other | Attending: Emergency Medicine | Admitting: Emergency Medicine

## 2014-06-25 ENCOUNTER — Encounter (HOSPITAL_COMMUNITY): Payer: Self-pay

## 2014-06-25 DIAGNOSIS — F419 Anxiety disorder, unspecified: Secondary | ICD-10-CM | POA: Insufficient documentation

## 2014-06-25 DIAGNOSIS — Z7982 Long term (current) use of aspirin: Secondary | ICD-10-CM | POA: Diagnosis not present

## 2014-06-25 DIAGNOSIS — R109 Unspecified abdominal pain: Secondary | ICD-10-CM | POA: Diagnosis not present

## 2014-06-25 DIAGNOSIS — J45909 Unspecified asthma, uncomplicated: Secondary | ICD-10-CM | POA: Diagnosis not present

## 2014-06-25 DIAGNOSIS — Z885 Allergy status to narcotic agent status: Secondary | ICD-10-CM | POA: Diagnosis not present

## 2014-06-25 DIAGNOSIS — Z8673 Personal history of transient ischemic attack (TIA), and cerebral infarction without residual deficits: Secondary | ICD-10-CM | POA: Insufficient documentation

## 2014-06-25 DIAGNOSIS — Z79899 Other long term (current) drug therapy: Secondary | ICD-10-CM | POA: Diagnosis not present

## 2014-06-25 DIAGNOSIS — R55 Syncope and collapse: Principal | ICD-10-CM | POA: Insufficient documentation

## 2014-06-25 DIAGNOSIS — H538 Other visual disturbances: Secondary | ICD-10-CM | POA: Insufficient documentation

## 2014-06-25 DIAGNOSIS — R42 Dizziness and giddiness: Secondary | ICD-10-CM | POA: Diagnosis not present

## 2014-06-25 LAB — I-STAT CHEM 8, ED
BUN: 13 mg/dL (ref 6–23)
CALCIUM ION: 1.13 mmol/L (ref 1.12–1.23)
CHLORIDE: 105 mmol/L (ref 96–112)
Creatinine, Ser: 0.7 mg/dL (ref 0.50–1.10)
Glucose, Bld: 106 mg/dL — ABNORMAL HIGH (ref 70–99)
HEMATOCRIT: 38 % (ref 36.0–46.0)
Hemoglobin: 12.9 g/dL (ref 12.0–15.0)
POTASSIUM: 4 mmol/L (ref 3.5–5.1)
Sodium: 140 mmol/L (ref 135–145)
TCO2: 20 mmol/L (ref 0–100)

## 2014-06-25 LAB — CBG MONITORING, ED: Glucose-Capillary: 99 mg/dL (ref 70–99)

## 2014-06-25 MED ORDER — SODIUM CHLORIDE 0.9 % IV SOLN
INTRAVENOUS | Status: AC
  Administered 2014-06-25: 16:00:00 via INTRAVENOUS

## 2014-06-25 NOTE — ED Notes (Signed)
Pt presents with onset of lightheadedness, abdominal pain, blurred vision while driving x 2 hours ago.  Pt reports SBP was 150s at home.  Pt reports sensation worsens when she is still and lessens when she gets up.

## 2014-06-25 NOTE — ED Notes (Signed)
Pt tolerated walking well

## 2014-06-25 NOTE — Discharge Instructions (Signed)
Near-Syncope Near-syncope (commonly known as near fainting) is sudden weakness, dizziness, or feeling like you might pass out. During an episode of near-syncope, you may also develop pale skin, have tunnel vision, or feel sick to your stomach (nauseous). Near-syncope may occur when getting up after sitting or while standing for a long time. It is caused by a sudden decrease in blood flow to the brain. This decrease can result from various causes or triggers, most of which are not serious. However, because near-syncope can sometimes be a sign of something serious, a medical evaluation is required. The specific cause is often not determined. HOME CARE INSTRUCTIONS  Monitor your condition for any changes. The following actions may help to alleviate any discomfort you are experiencing:  Have someone stay with you until you feel stable.  Lie down right away and prop your feet up if you start feeling like you might faint. Breathe deeply and steadily. Wait until all the symptoms have passed. Most of these episodes last only a few minutes. You may feel tired for several hours.   Drink enough fluids to keep your urine clear or pale yellow.   If you are taking blood pressure or heart medicine, get up slowly when seated or lying down. Take several minutes to sit and then stand. This can reduce dizziness.  Follow up with your health care provider as directed. SEEK IMMEDIATE MEDICAL CARE IF:   You have a severe headache.   You have unusual pain in the chest, abdomen, or back.   You are bleeding from the mouth or rectum, or you have black or tarry stool.   You have an irregular or very fast heartbeat.   You have repeated fainting or have seizure-like jerking during an episode.   You faint when sitting or lying down.   You have confusion.   You have difficulty walking.   You have severe weakness.   You have vision problems.  MAKE SURE YOU:   Understand these instructions.  Will  watch your condition.  Will get help right away if you are not doing well or get worse. Document Released: 02/26/2005 Document Revised: 03/03/2013 Document Reviewed: 08/01/2012 Exit[employer] Patient Information 2015 [employer]. This information is not intended to replace advice given to you by your health care provider. Make sure you discuss any questions you have with your health care provider.  

## 2014-06-25 NOTE — ED Provider Notes (Signed)
CSN: [id number]     Arrival date & time 06/25/14  1330 History   First MD Initiated Contact with Patient 06/25/14 1344     Chief Complaint  Patient presents with  . Near Syncope     (Consider location/radiation/quality/duration/timing/severity/associated sxs/prior Treatment) HPI Comments: Patient with multiple past medical problems including seizures, TIA, brain tumor, and anxiety presents to the emergency department with chief complaint of near syncopal episode. Patient states that she was driving approximately 2 hours ago when she became lightheaded and had some blurred vision. She states that she also had some mild abdominal pain which she no longer complains of. She states that she went home and ate some food and began to feel much better. She also states that she took her blood pressure and found it to be much higher than normal with systolic pressure being in the 150s. She states that she is concerned that her blood pressure is too high. Also concern that she may have diabetes and is concerned about her blood sugar level. She denies chest pain or shortness breath. Denies any nausea or vomiting. She reports that the symptoms worsened when she is calm, and are improved when she is busy doing something.  The history is provided by the patient. No language interpreter was used.    Past Medical History  Diagnosis Date  . TIA (transient ischemic attack)   . Seizures   . Brain tumor 2011  . Embolism - blood clot in left ovary  . Meningioma 08/14/2011    S/p craniotomy Encephaloma present non changing on MRI in 08/2011  . Anxiety   . Gallstones   . Asthma   . Clotting disorder   . Chills   . Unintentional weight loss   . Visual disturbance   . Palpitations   . Abdominal pain   . Rectal bleeding   . Nausea   . Rectal pain   . Blood in urine   . Weakness   . Confusion    Past Surgical History  Procedure Laterality Date  . Brain tumor removed  2011  . Ovarian mass removed  2010     left  . Cholecystectomy  12/03/2011    Procedure: LAPAROSCOPIC CHOLECYSTECTOMY WITH INTRAOPERATIVE CHOLANGIOGRAM;  Surgeon: Madilyn Hook, DO;  Location: [facility] OR;  Service: General;  Laterality: N/A;   Family History  Problem Relation Age of Onset  . Depression Mother   . Alcohol abuse Sister   . Alcohol abuse Brother   . Alzheimer's disease Maternal Grandmother   . Diabetes Maternal Grandmother   . Stroke Maternal Grandmother   . Heart failure Father   . Hypertension Father   . Heart disease Father    History  Substance Use Topics  . Smoking status: Never Smoker   . Smokeless tobacco: Not on file  . Alcohol Use: No   OB History    No data available     Review of Systems  Constitutional: Negative for fever and chills.  Respiratory: Negative for shortness of breath.   Cardiovascular: Negative for chest pain.  Gastrointestinal: Negative for nausea, vomiting, diarrhea and constipation.  Genitourinary: Negative for dysuria.  Neurological: Positive for light-headedness.  All other systems reviewed and are negative.     Allergies  Morphine and related  Home Medications   Prior to Admission medications   Medication Sig Start Date End Date Taking? Authorizing Provider  aspirin 81 MG chewable tablet Chew 1 tablet (81 mg total) by mouth daily. 08/28/13   Ryan  Jeannette How, MD  atorvastatin (LIPITOR) 40 MG tablet Take 1 tablet (40 mg total) by mouth daily at 6 PM. Patient not taking: Reported on 03/08/2014 08/28/13   Patrecia Pour, MD  meclizine (ANTIVERT) 25 MG tablet Take 1 tablet (25 mg total) by mouth 3 (three) times daily as needed for dizziness. 04/11/13   Orpah Greek, MD  Multiple Vitamin (MULTIVITAMIN WITH MINERALS) TABS Take 1 tablet by mouth daily.    Historical Provider, MD   BP 133/74 mmHg  Pulse 89  Temp(Src) 98.1 F (36.7 C) (Oral)  Resp 18  Ht 5\' 2"  (1.575 m)  Wt 160 lb (72.576 kg)  BMI 29.26 kg/m2  SpO2 100%  LMP 05/17/2014 Physical Exam   Constitutional: She is oriented to person, place, and time. She appears well-developed and well-nourished.  HENT:  Head: Normocephalic and atraumatic.  Eyes: Conjunctivae and EOM are normal. Pupils are equal, round, and reactive to light.  Neck: Normal range of motion. Neck supple.  Cardiovascular: Normal rate and regular rhythm.  Exam reveals no gallop and no friction rub.   No murmur heard. Pulmonary/Chest: Effort normal and breath sounds normal. No respiratory distress. She has no wheezes. She has no rales. She exhibits no tenderness.  Abdominal: Soft. Bowel sounds are normal. She exhibits no distension and no mass. There is no tenderness. There is no rebound and no guarding.  Musculoskeletal: Normal range of motion. She exhibits no edema or tenderness.  Neurological: She is alert and oriented to person, place, and time.  Skin: Skin is warm and dry.  Psychiatric: She has a normal mood and affect. Her behavior is normal. Judgment and thought content normal.  Nursing note and vitals reviewed.   ED Course  Procedures (including critical care time) Results for orders placed or performed during the hospital encounter of 06/25/14  CBG monitoring, ED  Result Value Ref Range   Glucose-Capillary 99 70 - 99 mg/dL  I-stat chem 8, ed  Result Value Ref Range   Sodium 140 135 - 145 mmol/L   Potassium 4.0 3.5 - 5.1 mmol/L   Chloride 105 96 - 112 mmol/L   BUN 13 6 - 23 mg/dL   Creatinine, Ser 0.70 0.50 - 1.10 mg/dL   Glucose, Bld 106 (H) 70 - 99 mg/dL   Calcium, Ion 1.13 1.12 - 1.23 mmol/L   TCO2 20 0 - 100 mmol/L   Hemoglobin 12.9 12.0 - 15.0 g/dL   HCT 38.0 36.0 - 46.0 %   No results found.   Imaging Review No results found.   EKG Interpretation None      MDM   Final diagnoses:  Near syncope    Patient with near syncopal episode. Will check orthostatics, hemoglobin, EKG, and CBG. Patient is now well-appearing. No apparent distress. Suspect that the patient can likely be  discharged home. She does have a history of brain tumor/meningioma, which is stable since 2013.  Patient states that she was feeling better, but then her blood pressure started to decrease and she began to feel little bit dizzy. Will give her some fluids, and will reassess. Anticipate discharge to home.  5:09 PM Feels improved.  Stable and ready for discharge.  DC to home with PCP follow-up.  Montine Circle, PA-C 06/25/14 1709  Serita Grit, MD 06/25/14 1739

## 2014-06-25 NOTE — ED Notes (Signed)
D/C Admission orders; notified Flow.

## 2014-06-29 NOTE — H&P (Signed)
PATIENT NAME:  Melanie [name], Melanie [name] MR#:  [id number] DATE OF BIRTH:  February 23, 1976  DATE OF ADMISSION:  11/07/2011  CHIEF COMPLAINT: Chest pain.   HISTORY OF PRESENT ILLNESS: 39 year old female with history of possible anxiety presents with chest pain that developed today. Chest pain is described as intermittent, 9/10 and comes and goes every 15 minutes, sharp in nature. She notes that it radiates to her left arm, neck and to shoulder and is also associated with dizziness and shooting pains in her stomach. These symptoms have never happened before and so she presented because she felt she was having a heart attack.   In the past she notes that she woke up in the middle of the night feeling like her heart was beating out of her chest and she presented to the Emergency Department and was told that she might have anxiety. She takes lorazepam nightly for insomnia.   Patient is particularly concerned about abdominal pain that occurs that is constant and symptoms that develop postprandially. To elaborate, patient has lost 30 or so pounds intentionally over the preceding few months by using over-the-counter stimulants one of which was called Meltdown as well as abusing laxatives. She said she has stopped taking these over the counter laxatives and stimulants for about one month. She also reports that she was going to place called [facility] and having plasma extracted twice a week every week for a year all in effort to lose weight. She notes that she has a history of transient ischemic attack and this was diagnosed May 2013 and after that transient ischemic attack she started taking 81 mg aspirin 8 tablets daily for a total of 648 mg of aspirin because she was afraid of having another mini stroke. Iron studies were done at [facility] two weeks ago and were overall normal. Iron was 65, TIBC was 324, sat 20%, UIBC 259, ferritin was 16. She also admits that at the time she was drinking a lot of water and had some increased  polyuria but that has since subsided as she has decreased her water intake. Due to her abdominal symptoms she has been intermittently selective of meals of food that she eats stating that: "When I eat two hours later I get blurry vision and I feel like my head is going to go into a coma. I feel like I am going to die."   In discussing with the patient she admits that she has had insomnia for some time and has been taking lorazepam since May nightly for this. She notes that she has difficulty concentrating, is extremely fatigued and in her own words "I sit by the phone so I can call the ambulance because I feel so weak I think I am going to die." She notes that she has lost interest in her various activities. 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 She admits to feelings of hopelessness.   She was evaluated at [facility] 10/24/2011 for concerns of depression and anxiety and was evaluated by psych. At that time she did not need criteria for acute psychiatric hospitalization so she was referred to outpatient psychiatric services and was started on fluoxetine. Patient has not been taking the fluoxetine. She states that for her abdominal symptoms she feels that none of the doctors are listening to her. She has "a health problem and everybody is trying to label it as anxiety or  depression." She has been referred to GI for further evaluation of her abdominal symptoms but was unable to be seen due to financial reasons. At time of this evaluation her chest pain has resolved. She is complaining of not only chest pain to me at this time but also low back pain over her " kidney area particularly on the left side". She states that the last time she was in the hospital she was told she had a urinary tract infection and was treated for that and she feels that she is having kidney pain now. Of note, review of the labs shows  that her urinalysis is negative.   Patient clearly denies suicidal or homicidal ideation. She admits that at one point she did say to a physician while she was crying she said that her pain was so severe that if she had a gun she would shoot herself and I think that is what prompted the psychiatric evaluation that [facility] two weeks ago. She states now "I know I shouldn't have said that. It was just due to the pain."   We have been called to admit her for chest pain, rule out.   PAST MEDICAL HISTORY:  1. Transient ischemic attack May 2013.  2. History of meningioma status post craniotomy in 2011. She has MRIs and as recently as 08/2011 MRI was unchanged.  3. History of post craniotomy seizure, was briefly maintained on antiepileptics but these have been discontinued according to neurological note from May 2013 at [facility]. It seems that Dilantin was discontinued around 03/2011 as she had no further seizures.  4. She had embolism of a blood clot in the left ovary.   PAST SURGICAL HISTORY: Craniotomy 2011 for meningioma resection.   ALLERGIES: No known drug allergies.   MEDICATIONS:  1. She admits to lorazepam 0.5 mg nightly which she takes every night for insomnia.  2. Multivitamins.  3. She denies any other supplements.    FAMILY HISTORY: Notable for coronary artery disease in her father who is deceased at age 42. He had at least four myocardial infarctions. Mother has degenerative disk disease and "nerve problems". Review of records from [facility] reports history of depression in mother. Alcohol abuse in brother and sister. Alzheimer's disease in maternal grandmother. Hypertension in father. Diabetes maternal grandmother as well as maternal aunt she tells me.   SOCIAL HISTORY: Patient denies tobacco, alcohol, illicit drug use.   REVIEW OF SYSTEMS: CONSTITUTIONAL: Admits to chills today, fatigue, 30 weight loss over the last 2 to 3 months intentional. Notes that she now has no appetite  and stopped laxatives about a month ago. EYES: Blurred vision usually two hours after eating meals that then resolves, otherwise no other blurred vision. ENT: Chronic intermittent tinnitus that lasts about one minute and then resolves. No epistaxis. RESPIRATORY: Chronic dry cough that occurs after eating. No wheezing. No dyspnea. CARDIOVASCULAR: Admits to chest pain as reported above. No orthopnea. She has very minimal dyspnea on exertion, intermittent palpitations. GASTROINTESTINAL: Denies nausea, vomiting, diarrhea. Admits to abdominal pain. Notes that she has black stools and twice over the last week had white stools that resolved. GENITOURINARY: Denies dysuria, frequency. ENDOCRINE: Denies increased sweating. INTEGUMENT: Denies any skin rashes. MUSCULOSKELETAL: Denies any muscle pains or joint pains. NEUROLOGICAL: Reports weakness in the left leg two weeks ago when she woke up and felt like she had to drag the left leg and she could not pick it up off the floor. She stopped her multivitamin at  that time thinking it could be causative but then (this is her words) "when I stopped my multivitamin I thought I was going to die so I restarted it and it helped my leg get better." PSYCH: Admits to insomnia. Denies stress. Denies depression. Denies anxiety, although and again in her own words she says: "I have had panic attacks before." Denies suicidal or homicidal ideation at this time.  PHYSICAL EXAMINATION: VITAL SIGNS: Temperature 96.9, heart rate 75, respirations 18, blood pressure 104/69, sating 99% on room air.   GENERAL: She is a well-appearing Caucasian female in no acute distress, well kept.   EYES: Normal lids. No evidence of lid lag. Pupils equal, round, and reactive to light and accommodation. Extraocular muscles are intact. Pink conjunctivae noted.   ENT: Normal external ears and nares. Posterior pharynx is clear. Moist mucous membranes. No oral lesions appreciated.   NECK: Supple. There is no  thyromegaly.   RESPIRATORY: Clear to auscultation bilaterally. No wheezes, rales, or rhonchi. Normal respiratory effort.    CARDIOVASCULAR: S1, S2 normal. Regular rate and rhythm. No murmurs appreciated. There is no pretibial edema.   ABDOMEN: Soft, nontender, nondistended, with normal active bowel sounds. No organomegaly appreciated.   RECTAL: No external hemorrhoids. Guaiac weakly positive.   LYMPHATICS: No lymph nodes appreciated in neck or groin.   SKIN: Warm and dry. There are no ecchymoses noted.   NEUROLOGICAL: Extraocular muscles are intact. There is facial symmetry. There is full closure of eyes and lifting of eyelids without delay. Facial sensation is intact. Shoulder shrug is equal. There is equal palatal lift and tongue is midline. There is 5/5 strength in bilateral upper extremities, flexors and extensors. Hand grip is equal. There is 5/5 strength proximal and distal lower extremity with normal tone. Gait is not assessed.   PSYCH: Patient is awake, alert, oriented x3. She seems to perseverate over the abdominal issue and repeated multiple times during the encounter almost as though she did not recall that she had mentioned the fact that she would get these neurological symptoms two hours after meals. She seemed to perseverate over the abdominal pain and the low back pain. Her affect seemed appropriate. She was not tearful during the exam. She initially made good eye contact but as the exam progressed would not maintain eye contact. Patient's judgment seems to be intact. Insight seems appropriate as she is focusing on her physical issues and is seeking answers.   LABORATORY, RADIOLOGICAL AND DIAGNOSTIC DATA: WBC count 6.1, hemoglobin 14.2, hematocrit 41.9, platelets 252, MCV 86, glucose 89, BUN 7, creatinine 0.62, sodium 140, potassium 3.7, chloride 106, bicarbonate 32, calcium 9.1, bilirubin 0.3, alkaline phosphatase 62, ALT 12, AST 16, total protein 7.6, albumin 3.7, osmolality 277,  anion gap 2. Urinalysis is negative for nitrites, leukocyte esterase. Pregnancy test is negative. TSH 1.26. Point of care blood glucose 95. D-dimer is negative at 0.4.    CT head is ordered and results are pending.   On monitor patient is normal sinus rhythm at 78 beats per minute.   ASSESSMENT AND PLAN: 39 year old lady with history of transient ischemic attack, laxative abuse, possible anxiety and depression presenting with atypical chest pain.  1. Atypical chest pain. Does not sound cardiac in nature. Pulmonary embolus has been ruled out with negative d-dimer. Given her aspirin dosages it is possible this could be as a result of gastritis. Will start her on a PPI at this time. We will trend cardiac enzymes given her family history of coronary artery  disease and concern of atherosclerosis given her transient ischemic attack in May. I would hold off on inpatient stress test and will check an EKG. Blood pressure has been under control. Will place on telemetry to rule out any arrhythmias.  2. Abdominal pain with postprandial neurological symptoms. Patient does have weakly positive stool guaiac at this time. Will place on PPI and place a GI consult for possibility of EGD. Regarding her overall abdominal picture, etiology includes possible bleed, gastritis, peptic ulcer disease, possibly irritable bowel syndrome or even psychosomatic from underlying mood disorder. I am hoping that GI evaluation will at least to help rule out any organic causes for her symptoms.   3. Insomnia. Will resume her low dose benzodiazepine, want to avoid concern of withdrawal.  4. Prophylaxis. Lovenox.  5. Disposition. Patient will be admitted to medical telemetry floor. Expect possible discharge within 48 to 72 hours.  6. CODE STATUS: Patient is a FULL CODE.   TIME SPENT: 60 minutes.   ____________________________ Samson Frederic, DO aeo:cms D: 11/07/2011 03:52:45 ET T: 11/07/2011 07:30:46 ET JOB#: [id number]  cc: Samson Frederic, DO, <Dictator> Dr. Janyth Contes at [place] SIGNED 11/14/2011 1:58

## 2014-06-29 NOTE — Discharge Summary (Signed)
PATIENT NAME:  Melanie [name], Melanie [name] MR#:  [id number] DATE OF BIRTH:  February 18, 1976  DATE OF ADMISSION:  11/07/2011 DATE OF DISCHARGE:  11/08/2011  REASON FOR ADMISSION: Chest pain.  DISCHARGE DIAGNOSES:  1. Chest pain due to anxiety, also dyspepsia causing abdominal pain. The patient has history of aspirin abuse for the past two months taking around eight tablets of 81 mg aspirin for the past two months.  2. History of anxiety. 3. History of chronic low back pain secondary to musculoskeletal origin.   DISCHARGE MEDICATIONS:  1. Xanax 0.5 mg daily at bedtime. 2. Prilosec 20 mg daily. 3. One-A-Day vitamin daily.   CONSULTANTS: Jill Side, MD - Gastroenterology.  HOSPITAL COURSE: The patient is a 39 year old female with multiple symptoms of anxiety, possible panic disorder, history of meningioma with craniotomy in 2011, and family history of coronary artery disease. She came in for chest pain. Look at the history and physical for full details. The patient was evaluated at [facility] on 10/24/2011 for possible anxiety and depression. The patient had chest pain which was midsternal, sharp pain radiating to the left arm to the neck, so she was admitted to telemetry for chest pain rule out. Troponins have been negative x3. She refused to take Lovenox. The patient was taking aspirin eight tablets for the past two months every day to prevent mini strokes, according to her, and so she was not on beta blockers because blood pressure is borderline. The patient did not have any further chest pain, but because of her abdominal pain and feels bloated after she eats and feels like her head is going into coma Dr. Jill Side saw her and did endoscopy because of her abdominal symptoms. The patient EGD is essentially normal with normal esophagus, normal stomach, and normal duodenum. So he suggested probably non-ulcer dyspepsia. He said consider Bentyl for it. The patient was seen this morning. She is unable to  urinate and feels like she is dying, but her ultrasound of the kidneys is essentially normal. Her BMP is also normal. Creatinine is 0.68. Ultrasound of the abdomen showed only cholelithiasis, no pericholecystic fluid. The right kidney is slightly smaller than the left. The patient says that if it is negative, she wants to go home. Her lipase is also 138. D-dimer is 0.40. A CT of the head was done and shows no evidence of abnormalities, postsurgical changes. The patient's CBC and BMP and everything is normal. Pregnancy test is negative. TSH is normal. Most likely her symptoms are related to anxiety and also she was on diet pills for about two to three years. The patient stopped taking them for the last two months. So most likely her symptoms are probably related to stopping the medication and also probably very anxious with probably underlying psychiatric problem. The patient's chest pain is atypical and her cardiac enzymes are normal. EKG did not show any changes. Regarding abdominal pain, the only finding is cholelithiasis. I am going to tell her the findings and probably she can see a surgeon as an outpatient and take PPIs for her non-ulcer dyspepsia. For insomnia, she has taken Xanax for a long time so she can continue that. She has no homicidal or suicidal ideation at this time. Probably she may have underlying somatization. She needs to have psychiatry evaluation.  TIME SPENT ON DISCHARGE PREPARATION: More than 30 minutes.  ____________________________ Epifanio Lesches, MD sk:slb D: 11/08/2011 10:08:11 ET T: 11/09/2011 09:39:15 ET JOB#: [id number]  cc: Epifanio Lesches, MD, <Dictator> Epifanio Lesches MD ELECTRONICALLY  SIGNED 11/22/2011 18:19

## 2014-06-29 NOTE — Consult Note (Signed)
Brief Consult Note: Diagnosis: Abdominal pain, chest pain etc.   Patient was seen by consultant.   Comments: Patient with multiple symptoms most likely functional and related to anxiety and panic attack. Doubt organic GI patholgy. EGD unremarkable.  Recommendations: Continue PPI. [place] in am. Further recommendations to follow.  Electronic Signatures: Jill Side (MD)  (Signed [phone] 18:02)  Authored: Brief Consult Note   Last Updated: 28-Aug-13 18:02 by Jill Side (MD)

## 2014-06-29 NOTE — Consult Note (Signed)
PATIENT NAME:  Melanie [name], Melanie [name] MR#:  [id number] DATE OF BIRTH:  01-Mar-1976  DATE OF CONSULTATION:  11/08/2011  REFERRING PHYSICIAN:   CONSULTING PHYSICIAN:  Jill Side, MD  CHIEF COMPLAINT:  Noncardiac chest pain.   HISTORY OF PRESENT ILLNESS: The patient is a 39 year old female with history of anxiety  admitted yesterday with chest pain. The patient has multiple vague symptoms. According to her, for the last few weeks every time she eats she feels like she is going to black out. The episodes last for about 30 minutes or so after which she starts to feel better. She presented to the Emergency Room with chest pain that started yesterday, reported to be 9/10 in severity, radiated to the left shoulder, neck, and back, and according to her she had a panic attack at about the same time. The patient was also complaining of palpitations the night before. The patient has been trying to lose weight and has intentionally lost about 30 pounds or so within the last few months. She has used over-the-counter medications. One of them is called Meltdown. She has been abusing laxatives and has had plasma extracted twice a week every week for a year. Denies any diarrhea or constipation. Recently she has been experiencing some rectal spasms and pain. No vomiting. No hematemesis. No melena.   PAST MEDICAL HISTORY:  1. History of transient ischemic attack in May 2013. 2. History of meningioma status post craniotomy in 2011.  3. History of post-craniotomy seizures.  4. History of her blood clot in her left ovary according to her. 5. The patient also was evaluated at [facility] earlier this month for depression and anxiety.   MEDICATIONS:  1. Lorazepam.  2. Multivitamin.   FAMILY HISTORY: Positive for coronary artery disease.   SOCIAL HISTORY:  She does not smoke or drink.     PHYSICAL EXAMINATION:  GENERAL: Fairly well built female, does not appear to be in any acute distress, quite awake, alert, and  oriented. Does appear to be somewhat anxious.  VITAL SIGNS: Vitals are fairly stable. Heart rate is in the 60s and 70s. Blood pressure 110/80. She has been afebrile and no jaundice was noted.   NECK:  Neck veins are flat.   LUNGS: Grossly clear to auscultation bilaterally with fair air entry and no added sounds.   CARDIOVASCULAR: Regular rate and rhythm. No gallops or murmur.   ABDOMEN: Quite soft and benign. Bowel sounds positive. Nontender, nondistended. No rebound or guarding was noted.   NEUROLOGIC: Grossly appears to be nonfocal.   LABS/STUDIES: White cell count 6.1, hemoglobin 14.2, hematocrit 41.9, platelet count 252 troponin less than 0.02. Serum amylase and lipase are normal. Liver enzymes are normal as well as electrolytes. Pregnancy test is negative. CT head without contrast was unremarkable except for postsurgical changes.   ASSESSMENT AND PLAN: The patient is with vague abdominal symptoms of sensation of passing out after meals. The patient has been abusing laxatives and has used multiple over-the-counter medications for weight loss. She appears to be very anxious and has had a panic attack recently. There may be an element of depression as well. The patient has vague other symptoms such as left flank pain and upper chest pain that radiates to the arm, neck, and back as well as rectal spasms.  Most likely she suffers from nonulcer dyspepsia or irritable bowel syndrome, which is responsible for most of her GI symptoms. An upper GI endoscopy was suggested and done yesterday, which was quite unremarkable. An ultrasound  was done after that which showed cholelithiasis but overall normal otherwise. The patient is currently being discharged. She will follow up with me as outpatient. We will refer her to general surgery for further evaluation of the cholelithiasis which does not appear to be symptomatic at this point. The patient may benefit from either a tricyclic antidepressant or SSRI in  terms of her abdominal symptoms.      Further recommendations will be made on her outpatient visit.     ____________________________ Jill Side, MD si:bjt D: 11/08/2011 17:10:30 ET T: 11/09/2011 07:21:47 ET JOB#: [id number]  cc: Jill Side, MD, <Dictator> Jill Side MD ELECTRONICALLY SIGNED 11/21/2011 11:02

## 2014-06-30 DIAGNOSIS — N8320 Unspecified ovarian cysts: Secondary | ICD-10-CM | POA: Diagnosis not present

## 2014-07-07 ENCOUNTER — Encounter: Payer: Medicare Other | Admitting: Family Medicine

## 2014-07-09 ENCOUNTER — Encounter: Payer: Self-pay | Admitting: Gynecologic Oncology

## 2014-07-09 ENCOUNTER — Ambulatory Visit: Payer: Medicare Other | Attending: Gynecologic Oncology | Admitting: Gynecologic Oncology

## 2014-07-09 VITALS — BP 142/86 | HR 63 | Temp 98.2°F | Resp 20 | Ht 62.0 in | Wt 165.0 lb

## 2014-07-09 DIAGNOSIS — N8320 Unspecified ovarian cysts: Secondary | ICD-10-CM | POA: Insufficient documentation

## 2014-07-09 DIAGNOSIS — N83201 Unspecified ovarian cyst, right side: Secondary | ICD-10-CM

## 2014-07-09 DIAGNOSIS — N83202 Unspecified ovarian cyst, left side: Secondary | ICD-10-CM

## 2014-07-09 NOTE — Progress Notes (Signed)
Consult Note: Gyn-Onc  Consult was requested by Dr. Gaetano Net for the evaluation of Melanie [name] 39 y.o. female  With bilateral ovarian cysts CC:  Chief Complaint  Patient presents with  . OVARIAN Cyst Bilateral    Assessment/Plan:  Ms. Melanie [name]  is a 39 y.o.  year old with likely endometriomas of the ovaries - right >left.  I am recommending surgery with robotic bilateral ovarian cystectomies. I discussed with the patient that it is my recommendation that we entirely remove the right ovary given the size of the cyst on that ovary, however she would prefer to only remove the ovaries if necessary. I discussed that in tact removal is recommended until cancer is ruled out and if I do not believe I can reasonable remove the right ovarian cyst in tact without attempting RSO I will not perform a cystectomy. On the contralateral side (left) we will try to preserve remaining ovary for hormonal reasons. I discussed that she has a high risk for recurrence of endoemtriomas if we leave ovaries in situ.  I discussed that if the pelvic organs are adhesed, it is sometimes not possible to spare the uterus or ovaries (necessitating total hyst, BSO), but I would only do this if it was the most surgically safe approach.  I discussed that her pain may not be a result of the cysts and I could not guarantee resolution of symptoms with ovarian cystectomy.  I discussed operative risks including  bleeding, infection, damage to internal organs (such as bladder,ureters, bowels), blood clot, reoperation and rehospitalization. I discussed the risk of conversion to lapartomy. I discussed all of the above risks are increased in a patient with endometriosis, adhesions and prior surgeries.  HPI: The patient is a 39 year old parous woman with a history of an endometrioma removal from the left ovary (15cm) in 2010 by Dr Skeet Latch. She is seen in consultation at the request of Dr Gaetano Net for new right ovarian cyst  measuring 8cm. The patient reports 3 months of intermittent, daily left lower quadrant pain. It is worse at 3pm. It is worse in the week before her cycle. She uses narcotics obtained "from the street" to allieviate the pain (including percocet and tramadol). She was seen by Dr Gaetano Net in April, 2016 and a transvaginal US was performed. It revealed a 7.8cm complex right ovarian cyst consistent with an endometrioma. It was simple in nature. There was an additional 4.9cm cyst on the right and a 2.7cm cysto on the right. The left ovary contained a 3cm cyst with low level echoes.  There was no free fluid seen.  She has a history of an ex lap and left ovarian cystectomy in 2010 with Dr Skeet Latch. She has completed child bearing but desires to retain fertility if possible.    Interval History: She has been experiencing bouts of dizziness and light headedness. She was seen in the ER recently (last week) with no pathology idnetified.  Current Meds:  Outpatient Encounter Prescriptions as of 07/09/2014  Medication Sig  . aspirin 81 MG chewable tablet Chew 1 tablet (81 mg total) by mouth daily.  . meclizine (ANTIVERT) 25 MG tablet Take 1 tablet (25 mg total) by mouth 3 (three) times daily as needed for dizziness.  . Multiple Vitamin (MULTIVITAMIN WITH MINERALS) TABS Take 1 tablet by mouth daily.  . [DISCONTINUED] atorvastatin (LIPITOR) 40 MG tablet Take 1 tablet (40 mg total) by mouth daily at 6 PM. (Patient not taking: Reported on 07/09/2014)  Allergy:  Allergies  Allergen Reactions  . Morphine And Related Nausea And Vomiting    Social Hx:   History   Social History  . Marital Status: Divorced    Spouse Name: N/A  . Number of Children: N/A  . Years of Education: N/A   Occupational History  . Not on file.   Social History Main Topics  . Smoking status: Never Smoker   . Smokeless tobacco: Not on file  . Alcohol Use: No  . Drug Use: No  . Sexual Activity: Not on file   Other Topics Concern   . Not on file   Social History Narrative    Past Surgical Hx:  Past Surgical History  Procedure Laterality Date  . Brain tumor removed  2011  . Ovarian mass removed  2010    left  . Cholecystectomy  12/03/2011    Procedure: LAPAROSCOPIC CHOLECYSTECTOMY WITH INTRAOPERATIVE CHOLANGIOGRAM;  Surgeon: Madilyn Hook, DO;  Location: [facility];  Service: General;  Laterality: N/A;    Past Medical Hx:  Past Medical History  Diagnosis Date  . TIA (transient ischemic attack)   . Seizures   . Brain tumor 2011  . Embolism - blood clot in left ovary  . Meningioma 08/14/2011    S/p craniotomy Encephaloma present non changing on MRI in 08/2011  . Anxiety   . Gallstones   . Asthma   . Clotting disorder   . Chills   . Unintentional weight loss   . Visual disturbance   . Palpitations   . Abdominal pain   . Rectal bleeding   . Nausea   . Rectal pain   . Blood in urine   . Weakness   . Confusion     Past Gynecological History:  CS x 1  Patient's last menstrual period was 05/17/2014.  Family Hx:  Family History  Problem Relation Age of Onset  . Depression Mother   . Alcohol abuse Sister   . Alcohol abuse Brother   . Alzheimer's disease Maternal Grandmother   . Diabetes Maternal Grandmother   . Stroke Maternal Grandmother   . Heart failure Father   . Hypertension Father   . Heart disease Father     Review of Systems:  Constitutional  Feels well,    ENT Normal appearing ears and nares bilaterally Skin/Breast  No rash, sores, jaundice, itching, dryness Cardiovascular  No chest pain, shortness of breath, or edema  Pulmonary  No cough or wheeze.  Gastro Intestinal  No nausea, vomitting, or diarrhoea. No bright red blood per rectum, no abdominal pain, change in bowel movement, or constipation.  Genito Urinary  No frequency, urgency, dysuria, see HPI Musculo Skeletal  No myalgia, arthralgia, joint swelling or pain  Neurologic  No weakness, numbness, change in gait,   Psychology  No depression, anxiety, insomnia.   Vitals:  Blood pressure 142/86, pulse 63, temperature 98.2 F (36.8 C), temperature source Oral, resp. rate 20, height 5\' 2"  (1.575 m), weight 165 lb (74.844 kg), last menstrual period 05/17/2014.  Physical Exam: WD in NAD Neck  Supple NROM, without any enlargements.  Lymph Node Survey No cervical supraclavicular or inguinal adenopathy Cardiovascular  Pulse normal rate, regularity and rhythm. S1 and S2 normal.  Lungs  Clear to auscultation bilateraly, without wheezes/crackles/rhonchi. Good air movement.  Skin  No rash/lesions/breakdown  Psychiatry  Alert and oriented to person, place, and time  Abdomen  Normoactive bowel sounds, abdomen soft, non-tender and  thin without evidence of hernia.  Back  No CVA tenderness Genito Urinary  Vulva/vagina: Normal external female genitalia.   No lesions. No discharge or bleeding.  Bladder/urethra:  No lesions or masses, well supported bladder  Vagina: no lesions  Cervix: Normal appearing, no lesions.  Uterus: Small, mobile, no parametrial involvement or nodularity.  Adnexa: fullness appreciated in the right adnexa. Mobile.  Rectal  Good tone, no masses no cul de sac nodularity.  Extremities  No bilateral cyanosis, clubbing or edema.   Donaciano Eva, MD   07/09/2014, 4:40 PM

## 2014-07-09 NOTE — Patient Instructions (Signed)
Preparing for your Surgery  Plan for surgery on August 24, 2014  with Dr. Denman George  Pre-operative Testing -You will receive a phone call from presurgical testing at [facility] regarding your surgery.      -You should not be taking blood thinners or aspirin at least ten days prior to surgery unless instructed by your surgeon.  Day Before Surgery at [facility] will be asked to take in only clear liquids the day before surgery.  Examples of clear liquids include broths, jello, and clear juices.  You may also be advised to perform a Miralax bowel prep or fleets enema the night before your surgery based off of your provider's recommendations.  You will be advised to have nothing to eat or drink after midnight the evening before.    Your role in recovery Your role is to become active as soon as directed by your doctor, while still giving yourself time to heal.  Rest when you feel tired. You will be asked to do the following in order to speed your recovery:  - Cough and breathe deeply. This helps toclear and expand your lungs and can prevent pneumonia. You may be given a spirometer to practice deep breathing. A staff member will show you how to use the spirometer. - Do mild physical activity. Walking or moving your legs help your circulation and body functions return to normal. A staff member will help you when you try to walk and will provide you with simple exercises. Do not try to get up or walk alone the first time. - Actively manage your pain. Managing your pain lets you move in comfort. We will ask you to rate your pain on a scale of zero to 10. It is your responsibility to tell your doctor or nurse where and how much you hurt so your pain can be treated.  Special Considerations -If you are diabetic, you may be placed on insulin after surgery to have closer control over your blood sugars to promote healing and recovery.  This does not mean that you will be discharged on insulin.   If applicable, your oral antidiabetics will be resumed when you are tolerating a solid diet.  -Your final pathology results from surgery should be available by the Friday after surgery and the results will be relayed to you when available.

## 2014-08-20 ENCOUNTER — Inpatient Hospital Stay (HOSPITAL_COMMUNITY): Admission: RE | Admit: 2014-08-20 | Payer: Medicare Other | Source: Ambulatory Visit

## 2014-08-20 ENCOUNTER — Encounter (HOSPITAL_COMMUNITY): Payer: Self-pay

## 2014-08-20 ENCOUNTER — Encounter (HOSPITAL_COMMUNITY)
Admission: RE | Admit: 2014-08-20 | Discharge: 2014-08-20 | Disposition: A | Discharge status: Home / Self Care | Payer: Medicare Other | Source: Ambulatory Visit | Attending: Gynecologic Oncology | Admitting: Gynecologic Oncology

## 2014-08-20 DIAGNOSIS — N8320 Unspecified ovarian cysts: Secondary | ICD-10-CM | POA: Diagnosis not present

## 2014-08-20 DIAGNOSIS — Z01818 Encounter for other preprocedural examination: Secondary | ICD-10-CM | POA: Insufficient documentation

## 2014-08-20 HISTORY — DX: Other specified postprocedural states: Z98.890

## 2014-08-20 HISTORY — DX: Other specified postprocedural states: R11.2

## 2014-08-20 HISTORY — DX: Headache, unspecified: R51.9

## 2014-08-20 HISTORY — DX: Adverse effect of unspecified anesthetic, initial encounter: T41.45XA

## 2014-08-20 HISTORY — DX: Headache: R51

## 2014-08-20 HISTORY — DX: Depression, unspecified: F32.A

## 2014-08-20 HISTORY — DX: Major depressive disorder, single episode, unspecified: F32.9

## 2014-08-20 HISTORY — DX: Other complications of anesthesia, initial encounter: T88.59XA

## 2014-08-20 LAB — CBC WITH DIFFERENTIAL/PLATELET
BASOS PCT: 0 % (ref 0–1)
Basophils Absolute: 0 10*3/uL (ref 0.0–0.1)
EOS ABS: 0.1 10*3/uL (ref 0.0–0.7)
EOS PCT: 2 % (ref 0–5)
HCT: 40.1 % (ref 36.0–46.0)
Hemoglobin: 13.6 g/dL (ref 12.0–15.0)
Lymphocytes Relative: 27 % (ref 12–46)
Lymphs Abs: 1.6 10*3/uL (ref 0.7–4.0)
MCH: 29.4 pg (ref 26.0–34.0)
MCHC: 33.9 g/dL (ref 30.0–36.0)
MCV: 86.6 fL (ref 78.0–100.0)
MONOS PCT: 7 % (ref 3–12)
Monocytes Absolute: 0.4 10*3/uL (ref 0.1–1.0)
NEUTROS PCT: 64 % (ref 43–77)
Neutro Abs: 3.7 10*3/uL (ref 1.7–7.7)
Platelets: 308 10*3/uL (ref 150–400)
RBC: 4.63 MIL/uL (ref 3.87–5.11)
RDW: 13 % (ref 11.5–15.5)
WBC: 5.8 10*3/uL (ref 4.0–10.5)

## 2014-08-20 LAB — COMPREHENSIVE METABOLIC PANEL
ALT: 10 U/L — ABNORMAL LOW (ref 14–54)
ANION GAP: 7 (ref 5–15)
AST: 14 U/L — ABNORMAL LOW (ref 15–41)
Albumin: 3.9 g/dL (ref 3.5–5.0)
Alkaline Phosphatase: 55 U/L (ref 38–126)
BILIRUBIN TOTAL: 0.6 mg/dL (ref 0.3–1.2)
BUN: 13 mg/dL (ref 6–20)
CALCIUM: 9 mg/dL (ref 8.9–10.3)
CO2: 27 mmol/L (ref 22–32)
CREATININE: 0.65 mg/dL (ref 0.44–1.00)
Chloride: 106 mmol/L (ref 101–111)
GFR calc non Af Amer: >60 mL/min (ref >60)
Glucose, Bld: 83 mg/dL (ref 65–99)
POTASSIUM: 4.2 mmol/L (ref 3.5–5.1)
Sodium: 140 mmol/L (ref 135–145)
Total Protein: 7.7 g/dL (ref 6.5–8.1)

## 2014-08-20 LAB — HCG, SERUM, QUALITATIVE: Preg, Serum: NEGATIVE

## 2014-08-20 NOTE — Progress Notes (Addendum)
CT abd/pevis w contast 03/09/2014 in epic lungs mentioned EKG epic 06/25/2014 ECHO 08/28/2013 in epic Carotid doppler in epic 08/28/2013  MRI brain w/o contrast 08/27/2013  OV note per Dr Johnsie Cancel / cardiologist 02/11/2012 with no mention of further followup Last neuro check with ER to admission visit 08/27/2013  Pt has not see PCP since 2013

## 2014-08-20 NOTE — Progress Notes (Signed)
Spoke with Dr Deatra Canter / anesthesia in regards to pts H&P and recent ER visit on 06/2014. Anesthesia to see pt day of surgery. Pt was encouraged to followup with her neurologist in regards to her recent issues per anesthesia's advise. Pt verbalized understanding.

## 2014-08-20 NOTE — Patient Instructions (Signed)
Melanie [name]  08/20/2014   Your procedure is scheduled on: Tuesday August 24, 2014   Report to [facility] Main  Entrance and follow signs to               [place] arrive at 11:30 AM.  Call this number if you have problems the morning of surgery [phone]   Remember: ONLY 1 PERSON MAY GO WITH YOU TO SHORT STAY TO GET  READY MORNING OF [place].  Do not eat food After Midnight but may take clear liquids till 8:00 am day of surgery then nothing by mouth.      Take these medicines the morning of surgery with A SIP OF WATER: NONE                               You may not have any metal on your body including hair pins and              piercings  Do not wear jewelry, make-up, lotions, powders or perfumes, deodorant             Do not wear nail polish.  Do not shave  48 hours prior to surgery.             Do not bring valuables to the hospital. [place].  Contacts, dentures or bridgework may not be worn into surgery.      Patients discharged the day of surgery will not be allowed to drive home.  Name and phone number of your driver:Kathy Derstine (mother)  Special Instructions:CLEAR LIQUID DIET DAY PRIOR TO SURGERY.              Please read over the following fact sheets you were given:INCENTIVE SPIROMETER; BLOOD TRANSFUSION FACT SHEET  _____________________________________________________________________             [facility] - Preparing for Surgery Before surgery, you can play an important role.  Because skin is not sterile, your skin needs to be as free of germs as possible.  You can reduce the number of germs on your skin by washing with CHG (chlorahexidine gluconate) soap before surgery.  CHG is an antiseptic cleaner which kills germs and bonds with the skin to continue killing germs even after washing. Please DO NOT use if you have an allergy to CHG or antibacterial soaps.  If your skin  becomes reddened/irritated stop using the CHG and inform your nurse when you arrive at Short Stay. Do not shave (including legs and underarms) for at least 48 hours prior to the first CHG shower.  You may shave your face/neck. Please follow these instructions carefully:  1.  Shower with CHG Soap the night before surgery and the  morning of Surgery.  2.  If you choose to wash your hair, wash your hair first as usual with your  normal  shampoo.  3.  After you shampoo, rinse your hair and body thoroughly to remove the  shampoo.                           4.  Use CHG as you would any other liquid soap.  You can apply chg directly  to the  skin and wash                       Gently with a scrungie or clean washcloth.  5.  Apply the CHG Soap to your body ONLY FROM THE NECK DOWN.   Do not use on face/ open                           Wound or open sores. Avoid contact with eyes, ears mouth and genitals (private parts).                       Wash face,  Genitals (private parts) with your normal soap.             6.  Wash thoroughly, paying special attention to the area where your surgery  will be performed.  7.  Thoroughly rinse your body with warm water from the neck down.  8.  DO NOT shower/wash with your normal soap after using and rinsing off  the CHG Soap.                9.  Pat yourself dry with a clean towel.            10.  Wear clean pajamas.            11.  Place clean sheets on your bed the night of your first shower and do not  sleep with pets. Day of Surgery : Do not apply any lotions/deodorants the morning of surgery.  Please wear clean clothes to the hospital/surgery center.  FAILURE TO FOLLOW THESE INSTRUCTIONS MAY RESULT IN THE CANCELLATION OF YOUR SURGERY PATIENT SIGNATURE_________________________________  NURSE SIGNATURE__________________________________  ________________________________________________________________________    CLEAR LIQUID DIET   Foods Allowed                                                                      Foods Excluded  Coffee and tea, regular and decaf                             liquids that you cannot  Plain Jell-O in any flavor                                             see through such as: Fruit ices (not with fruit pulp)                                     milk, soups, orange juice  Iced Popsicles                                    All solid food Carbonated beverages, regular and diet  Cranberry, grape and apple juices Sports drinks like Gatorade Lightly seasoned clear broth or consume(fat free) Sugar, honey syrup  Sample Menu Breakfast                                Lunch                                     Supper Cranberry juice                    Beef broth                            Chicken broth Jell-O                                     Grape juice                           Apple juice Coffee or tea                        Jell-O                                      Popsicle                                                Coffee or tea                        Coffee or tea  _____________________________________________________________________    Incentive Spirometer  An incentive spirometer is a tool that can help keep your lungs clear and active. This tool measures how well you are filling your lungs with each breath. Taking long deep breaths may help reverse or decrease the chance of developing breathing (pulmonary) problems (especially infection) following:  A long period of time when you are unable to move or be active. BEFORE THE PROCEDURE   If the spirometer includes an indicator to show your best effort, your nurse or respiratory therapist will set it to a desired goal.  If possible, sit up straight or lean slightly forward. Try not to slouch.  Hold the incentive spirometer in an upright position. INSTRUCTIONS FOR USE   Sit on the edge of your bed if possible, or sit up as far  as you can in bed or on a chair.  Hold the incentive spirometer in an upright position.  Breathe out normally.  Place the mouthpiece in your mouth and seal your lips tightly around it.  Breathe in slowly and as deeply as possible, raising the piston or the ball toward the top of the column.  Hold your breath for 3-5 seconds or for as long as possible. Allow the piston or ball to fall to the bottom of the column.  Remove the mouthpiece from your mouth and breathe out normally.  Rest for a few seconds and repeat Steps 1 through 7 at  least 10 times every 1-2 hours when you are awake. Take your time and take a few normal breaths between deep breaths.  The spirometer may include an indicator to show your best effort. Use the indicator as a goal to work toward during each repetition.  After each set of 10 deep breaths, practice coughing to be sure your lungs are clear. If you have an incision (the cut made at the time of surgery), support your incision when coughing by placing a pillow or rolled up towels firmly against it. Once you are able to get out of bed, walk around indoors and cough well. You may stop using the incentive spirometer when instructed by your caregiver.  RISKS AND COMPLICATIONS  Take your time so you do not get dizzy or light-headed.  If you are in pain, you may need to take or ask for pain medication before doing incentive spirometry. It is harder to take a deep breath if you are having pain. AFTER USE  Rest and breathe slowly and easily.  It can be helpful to keep track of a log of your progress. Your caregiver can provide you with a simple table to help with this. If you are using the spirometer at home, follow these instructions: [place] IF:   You are having difficultly using the spirometer.  You have trouble using the spirometer as often as instructed.  Your pain medication is not giving enough relief while using the spirometer.  You develop fever  of 100.5 F (38.1 C) or higher. SEEK IMMEDIATE MEDICAL CARE IF:   You cough up bloody sputum that had not been present before.  You develop fever of 102 F (38.9 C) or greater.  You develop worsening pain at or near the incision site. MAKE SURE YOU:   Understand these instructions.  Will watch your condition.  Will get help right away if you are not doing well or get worse. Document Released: 07/09/2006 Document Revised: 05/21/2011 Document Reviewed: 09/09/2006 [employer] Patient Information 2014 [employer], [place].   ________________________________________________________________________  WHAT IS A BLOOD TRANSFUSION? Blood Transfusion Information  A transfusion is the replacement of blood or some of its parts. Blood is made up of multiple cells which provide different functions.  Red blood cells carry oxygen and are used for blood loss replacement.  White blood cells fight against infection.  Platelets control bleeding.  Plasma helps clot blood.  Other blood products are available for specialized needs, such as hemophilia or other clotting disorders. BEFORE THE TRANSFUSION  Who gives blood for transfusions?   Healthy volunteers who are fully evaluated to make sure their blood is safe. This is blood bank blood. Transfusion therapy is the safest it has ever been in the practice of medicine. Before blood is taken from a donor, a complete history is taken to make sure that person has no history of diseases nor engages in risky social behavior (examples are intravenous drug use or sexual activity with multiple partners). The donor's travel history is screened to minimize risk of transmitting infections, such as malaria. The donated blood is tested for signs of infectious diseases, such as HIV and hepatitis. The blood is then tested to be sure it is compatible with you in order to minimize the chance of a transfusion reaction. If you or a relative donates blood, this is often done in  anticipation of surgery and is not appropriate for emergency situations. It takes many days to process the donated blood. RISKS AND COMPLICATIONS Although transfusion  therapy is very safe and saves many lives, the main dangers of transfusion include:   Getting an infectious disease.  Developing a transfusion reaction. This is an allergic reaction to something in the blood you were given. Every precaution is taken to prevent this. The decision to have a blood transfusion has been considered carefully by your caregiver before blood is given. Blood is not given unless the benefits outweigh the risks. AFTER THE TRANSFUSION  Right after receiving a blood transfusion, you will usually feel much better and more energetic. This is especially true if your red blood cells have gotten low (anemic). The transfusion raises the level of the red blood cells which carry oxygen, and this usually causes an energy increase.  The nurse administering the transfusion will monitor you carefully for complications. HOME CARE INSTRUCTIONS  No special instructions are needed after a transfusion. You may find your energy is better. Speak with your caregiver about any limitations on activity for underlying diseases you may have. SEEK MEDICAL CARE IF:   Your condition is not improving after your transfusion.  You develop redness or irritation at the intravenous (IV) site. SEEK IMMEDIATE MEDICAL CARE IF:  Any of the following symptoms occur over the next 12 hours:  Shaking chills.  You have a temperature by mouth above 102 F (38.9 C), not controlled by medicine.  Chest, back, or muscle pain.  People around you feel you are not acting correctly or are confused.  Shortness of breath or difficulty breathing.  Dizziness and fainting.  You get a rash or develop hives.  You have a decrease in urine output.  Your urine turns a dark color or changes to pink, red, or brown. Any of the following symptoms occur over  the next 10 days:  You have a temperature by mouth above 102 F (38.9 C), not controlled by medicine.  Shortness of breath.  Weakness after normal activity.  The white part of the eye turns yellow (jaundice).  You have a decrease in the amount of urine or are urinating less often.  Your urine turns a dark color or changes to pink, red, or brown. Document Released: 02/24/2000 Document Revised: 05/21/2011 Document Reviewed: 10/13/2007 [facility] Patient Information 2014 [place], [place].  _______________________________________________________________________

## 2014-08-21 LAB — CA 125: CA 125: 105.7 U/mL — ABNORMAL HIGH (ref 0.0–38.1)

## 2014-08-23 NOTE — Progress Notes (Signed)
CA 125 results in epic per PAT visit 08/20/2014 sent to Dr Everitt Amber

## 2014-08-24 ENCOUNTER — Ambulatory Visit (HOSPITAL_COMMUNITY): Payer: Medicare Other | Admitting: Anesthesiology

## 2014-08-24 ENCOUNTER — Encounter (HOSPITAL_COMMUNITY)
Admission: RE | Disposition: A | Discharge status: Home / Self Care | Payer: Self-pay | Source: Ambulatory Visit | Attending: Gynecologic Oncology

## 2014-08-24 ENCOUNTER — Encounter (HOSPITAL_COMMUNITY): Payer: Self-pay | Admitting: *Deleted

## 2014-08-24 ENCOUNTER — Ambulatory Visit (HOSPITAL_COMMUNITY)
Admission: RE | Admit: 2014-08-24 | Discharge: 2014-08-25 | Disposition: A | Discharge status: Home / Self Care | Payer: Medicare Other | Source: Ambulatory Visit | Attending: Gynecologic Oncology | Admitting: Gynecologic Oncology

## 2014-08-24 DIAGNOSIS — N808 Other endometriosis: Secondary | ICD-10-CM | POA: Diagnosis not present

## 2014-08-24 DIAGNOSIS — N135 Crossing vessel and stricture of ureter without hydronephrosis: Secondary | ICD-10-CM | POA: Diagnosis not present

## 2014-08-24 DIAGNOSIS — N803 Endometriosis of pelvic peritoneum: Secondary | ICD-10-CM | POA: Diagnosis not present

## 2014-08-24 DIAGNOSIS — Z885 Allergy status to narcotic agent status: Secondary | ICD-10-CM | POA: Insufficient documentation

## 2014-08-24 DIAGNOSIS — N809 Endometriosis, unspecified: Secondary | ICD-10-CM

## 2014-08-24 DIAGNOSIS — F419 Anxiety disorder, unspecified: Secondary | ICD-10-CM | POA: Diagnosis not present

## 2014-08-24 DIAGNOSIS — Z8673 Personal history of transient ischemic attack (TIA), and cerebral infarction without residual deficits: Secondary | ICD-10-CM | POA: Insufficient documentation

## 2014-08-24 DIAGNOSIS — J45909 Unspecified asthma, uncomplicated: Secondary | ICD-10-CM | POA: Diagnosis not present

## 2014-08-24 DIAGNOSIS — N736 Female pelvic peritoneal adhesions (postinfective): Secondary | ICD-10-CM | POA: Diagnosis not present

## 2014-08-24 DIAGNOSIS — N801 Endometriosis of ovary: Secondary | ICD-10-CM

## 2014-08-24 DIAGNOSIS — N8329 Other ovarian cysts: Secondary | ICD-10-CM | POA: Diagnosis not present

## 2014-08-24 DIAGNOSIS — Z7982 Long term (current) use of aspirin: Secondary | ICD-10-CM | POA: Diagnosis not present

## 2014-08-24 DIAGNOSIS — N80109 Endometriosis of ovary, unspecified side, unspecified depth: Secondary | ICD-10-CM | POA: Diagnosis present

## 2014-08-24 DIAGNOSIS — N8320 Unspecified ovarian cysts: Secondary | ICD-10-CM | POA: Diagnosis not present

## 2014-08-24 DIAGNOSIS — Z9889 Other specified postprocedural states: Secondary | ICD-10-CM

## 2014-08-24 HISTORY — PX: LAPAROSCOPIC OVARIAN CYSTECTOMY: SHX6248

## 2014-08-24 LAB — TYPE AND SCREEN
ABO/RH(D): A POS
Antibody Screen: NEGATIVE

## 2014-08-24 SURGERY — EXCISION, CYST, OVARY, LAPAROSCOPIC
Anesthesia: General | Site: Abdomen | Laterality: Bilateral

## 2014-08-24 MED ORDER — STERILE WATER FOR IRRIGATION IR SOLN
Status: DC | PRN
  Administered 2014-08-24: 1500 mL

## 2014-08-24 MED ORDER — ONDANSETRON HCL 4 MG/2ML IJ SOLN
INTRAMUSCULAR | Status: AC
  Filled 2014-08-24: qty 2

## 2014-08-24 MED ORDER — GLYCOPYRROLATE 0.2 MG/ML IJ SOLN
INTRAMUSCULAR | Status: AC
  Filled 2014-08-24: qty 1

## 2014-08-24 MED ORDER — LIDOCAINE HCL (CARDIAC) 20 MG/ML IV SOLN
INTRAVENOUS | Status: AC
  Filled 2014-08-24: qty 5

## 2014-08-24 MED ORDER — NEOSTIGMINE METHYLSULFATE 10 MG/10ML IV SOLN
INTRAVENOUS | Status: AC
  Filled 2014-08-24: qty 1

## 2014-08-24 MED ORDER — TRAMADOL HCL 50 MG PO TABS: 100.0000 mg | ORAL_TABLET | Freq: Four times a day (QID) | ORAL | Status: DC | PRN

## 2014-08-24 MED ORDER — LIDOCAINE HCL (CARDIAC) 20 MG/ML IV SOLN
INTRAVENOUS | Status: DC | PRN
  Administered 2014-08-24: 50 mg via INTRAVENOUS

## 2014-08-24 MED ORDER — PROPOFOL 10 MG/ML IV BOLUS
INTRAVENOUS | Status: AC
  Filled 2014-08-24: qty 20

## 2014-08-24 MED ORDER — FENTANYL CITRATE (PF) 100 MCG/2ML IJ SOLN
INTRAMUSCULAR | Status: DC | PRN
  Administered 2014-08-24: 150 ug via INTRAVENOUS
  Administered 2014-08-24 (×2): 50 ug via INTRAVENOUS

## 2014-08-24 MED ORDER — KETOROLAC TROMETHAMINE 15 MG/ML IJ SOLN
15.0000 mg | Freq: Four times a day (QID) | INTRAMUSCULAR | Status: DC
  Administered 2014-08-24 – 2014-08-25 (×2): 15 mg via INTRAVENOUS
  Filled 2014-08-24 (×5): qty 1

## 2014-08-24 MED ORDER — DEXAMETHASONE SODIUM PHOSPHATE 10 MG/ML IJ SOLN
INTRAMUSCULAR | Status: AC
  Filled 2014-08-24: qty 1

## 2014-08-24 MED ORDER — PROMETHAZINE HCL 25 MG/ML IJ SOLN
INTRAMUSCULAR | Status: AC
  Filled 2014-08-24: qty 1

## 2014-08-24 MED ORDER — SCOPOLAMINE 1 MG/3DAYS TD PT72
MEDICATED_PATCH | TRANSDERMAL | Status: DC | PRN
  Administered 2014-08-24: 1 via TRANSDERMAL

## 2014-08-24 MED ORDER — CEFAZOLIN SODIUM-DEXTROSE 2-3 GM-% IV SOLR
INTRAVENOUS | Status: AC
  Filled 2014-08-24: qty 50

## 2014-08-24 MED ORDER — METOCLOPRAMIDE HCL 5 MG/ML IJ SOLN
INTRAMUSCULAR | Status: DC | PRN
  Administered 2014-08-24: 10 mg via INTRAVENOUS

## 2014-08-24 MED ORDER — ARTIFICIAL TEARS OP OINT
TOPICAL_OINTMENT | OPHTHALMIC | Status: AC
  Filled 2014-08-24: qty 3.5

## 2014-08-24 MED ORDER — MIDAZOLAM HCL 2 MG/2ML IJ SOLN
INTRAMUSCULAR | Status: AC
  Filled 2014-08-24: qty 2

## 2014-08-24 MED ORDER — SODIUM CHLORIDE 0.9 % IV SOLN: 250.0000 mL | INTRAVENOUS | Status: DC | PRN

## 2014-08-24 MED ORDER — GLYCOPYRROLATE 0.2 MG/ML IJ SOLN
INTRAMUSCULAR | Status: DC | PRN
  Administered 2014-08-24: 0.6 mg via INTRAVENOUS
  Administered 2014-08-24: 0.2 mg via INTRAVENOUS

## 2014-08-24 MED ORDER — LACTATED RINGERS IV SOLN
INTRAVENOUS | Status: DC | PRN
  Administered 2014-08-24: 1000 mL

## 2014-08-24 MED ORDER — LACTATED RINGERS IV SOLN: INTRAVENOUS | Status: DC

## 2014-08-24 MED ORDER — HYDROMORPHONE HCL 1 MG/ML IJ SOLN
INTRAMUSCULAR | Status: DC | PRN
  Administered 2014-08-24 (×4): 0.5 mg via INTRAVENOUS

## 2014-08-24 MED ORDER — FENTANYL CITRATE (PF) 250 MCG/5ML IJ SOLN
INTRAMUSCULAR | Status: AC
  Filled 2014-08-24: qty 5

## 2014-08-24 MED ORDER — FENTANYL CITRATE (PF) 100 MCG/2ML IJ SOLN
INTRAMUSCULAR | Status: AC
  Filled 2014-08-24: qty 2

## 2014-08-24 MED ORDER — METOCLOPRAMIDE HCL 5 MG/ML IJ SOLN
INTRAMUSCULAR | Status: AC
  Filled 2014-08-24: qty 2

## 2014-08-24 MED ORDER — HYDROMORPHONE HCL 2 MG/ML IJ SOLN
INTRAMUSCULAR | Status: AC
  Filled 2014-08-24: qty 1

## 2014-08-24 MED ORDER — ROCURONIUM BROMIDE 100 MG/10ML IV SOLN
INTRAVENOUS | Status: AC
  Filled 2014-08-24: qty 1

## 2014-08-24 MED ORDER — SODIUM CHLORIDE 0.9 % IJ SOLN
INTRAMUSCULAR | Status: AC
  Filled 2014-08-24: qty 10

## 2014-08-24 MED ORDER — ACETAMINOPHEN 650 MG RE SUPP
650.0000 mg | RECTAL | Status: DC | PRN
  Filled 2014-08-24: qty 1

## 2014-08-24 MED ORDER — SODIUM CHLORIDE 0.9 % IJ SOLN: 3.0000 mL | INTRAMUSCULAR | Status: DC | PRN

## 2014-08-24 MED ORDER — FENTANYL CITRATE (PF) 100 MCG/2ML IJ SOLN: 25.0000 ug | INTRAMUSCULAR | Status: DC | PRN

## 2014-08-24 MED ORDER — LACTATED RINGERS IV SOLN
INTRAVENOUS | Status: DC
  Administered 2014-08-24 (×2): via INTRAVENOUS
  Administered 2014-08-24: 1000 mL via INTRAVENOUS

## 2014-08-24 MED ORDER — SCOPOLAMINE 1 MG/3DAYS TD PT72
MEDICATED_PATCH | TRANSDERMAL | Status: AC
  Filled 2014-08-24: qty 1

## 2014-08-24 MED ORDER — CEFAZOLIN SODIUM-DEXTROSE 2-3 GM-% IV SOLR
INTRAVENOUS | Status: DC | PRN
  Administered 2014-08-24: 2 g via INTRAVENOUS

## 2014-08-24 MED ORDER — SODIUM CHLORIDE 0.9 % IJ SOLN: 3.0000 mL | Freq: Two times a day (BID) | INTRAMUSCULAR | Status: DC

## 2014-08-24 MED ORDER — DEXAMETHASONE SODIUM PHOSPHATE 10 MG/ML IJ SOLN
INTRAMUSCULAR | Status: DC | PRN
  Administered 2014-08-24: 10 mg via INTRAVENOUS

## 2014-08-24 MED ORDER — GLYCOPYRROLATE 0.2 MG/ML IJ SOLN
INTRAMUSCULAR | Status: AC
  Filled 2014-08-24: qty 2

## 2014-08-24 MED ORDER — MIDAZOLAM HCL 5 MG/5ML IJ SOLN
INTRAMUSCULAR | Status: DC | PRN
  Administered 2014-08-24 (×2): 1 mg via INTRAVENOUS

## 2014-08-24 MED ORDER — MEPERIDINE HCL 50 MG/ML IJ SOLN: 6.2500 mg | INTRAMUSCULAR | Status: DC | PRN

## 2014-08-24 MED ORDER — FENTANYL CITRATE (PF) 100 MCG/2ML IJ SOLN
25.0000 ug | INTRAMUSCULAR | Status: DC | PRN
  Administered 2014-08-24 (×2): 25 ug via INTRAVENOUS

## 2014-08-24 MED ORDER — NEOSTIGMINE METHYLSULFATE 10 MG/10ML IV SOLN
INTRAVENOUS | Status: DC | PRN
  Administered 2014-08-24: 4 mg via INTRAVENOUS

## 2014-08-24 MED ORDER — ACETAMINOPHEN 325 MG PO TABS: 650.0000 mg | ORAL_TABLET | ORAL | Status: DC | PRN

## 2014-08-24 MED ORDER — KETOROLAC TROMETHAMINE 15 MG/ML IJ SOLN
INTRAMUSCULAR | Status: AC
  Filled 2014-08-24: qty 1

## 2014-08-24 MED ORDER — PROMETHAZINE HCL 25 MG/ML IJ SOLN
6.2500 mg | INTRAMUSCULAR | Status: AC | PRN
  Administered 2014-08-24 (×2): 6.25 mg via INTRAVENOUS

## 2014-08-24 MED ORDER — ROCURONIUM BROMIDE 100 MG/10ML IV SOLN
INTRAVENOUS | Status: DC | PRN
  Administered 2014-08-24: 10 mg via INTRAVENOUS
  Administered 2014-08-24: 50 mg via INTRAVENOUS

## 2014-08-24 MED ORDER — PROPOFOL 10 MG/ML IV BOLUS
INTRAVENOUS | Status: DC | PRN
  Administered 2014-08-24: 150 mg via INTRAVENOUS

## 2014-08-24 MED ORDER — ONDANSETRON HCL 4 MG/2ML IJ SOLN
INTRAMUSCULAR | Status: DC | PRN
Start: 2014-08-24 — End: 2014-08-24
  Administered 2014-08-24 (×2): 4 mg via INTRAVENOUS

## 2014-08-24 MED ORDER — TRAMADOL HCL 50 MG PO TABS: 50.0000 mg | ORAL_TABLET | Freq: Four times a day (QID) | ORAL | Status: DC | PRN

## 2014-08-24 SURGICAL SUPPLY — 47 items
CHLORAPREP W/TINT 26ML (MISCELLANEOUS) ×2 IMPLANT
COVER SURGICAL LIGHT HANDLE (MISCELLANEOUS) ×2 IMPLANT
COVER TIP SHEARS 8 DVNC (MISCELLANEOUS) ×1 IMPLANT
COVER TIP SHEARS 8MM DA VINCI (MISCELLANEOUS) ×1
DRAPE ARM DVNC X/XI (DISPOSABLE) ×4 IMPLANT
DRAPE COLUMN DVNC XI (DISPOSABLE) ×1 IMPLANT
DRAPE DA VINCI XI ARM (DISPOSABLE) ×4
DRAPE DA VINCI XI COLUMN (DISPOSABLE) ×1
DRAPE SHEET LG 3/4 BI-LAMINATE (DRAPES) ×4 IMPLANT
DRAPE SURG IRRIG POUCH 19X23 (DRAPES) ×2 IMPLANT
DRAPE TABLE BACK 44X90 PK DISP (DRAPES) ×4 IMPLANT
DRAPE WARM FLUID 44X44 (DRAPE) IMPLANT
ELECT REM PT RETURN 9FT ADLT (ELECTROSURGICAL) ×2
ELECTRODE REM PT RTRN 9FT ADLT (ELECTROSURGICAL) ×1 IMPLANT
FLOSEAL 10ML (HEMOSTASIS) ×2 IMPLANT
GLOVE BIO SURGEON STRL SZ 6 (GLOVE) ×6 IMPLANT
GLOVE BIO SURGEON STRL SZ 6.5 (GLOVE) ×4 IMPLANT
GOWN STRL REUS W/ TWL LRG LVL3 (GOWN DISPOSABLE) ×2 IMPLANT
GOWN STRL REUS W/TWL LRG LVL3 (GOWN DISPOSABLE) ×2
HOLDER FOLEY CATH W/STRAP (MISCELLANEOUS) ×2 IMPLANT
KIT ACCESSORY DA VINCI DISP (KITS)
KIT ACCESSORY DVNC DISP (KITS) IMPLANT
KIT BASIN OR (CUSTOM PROCEDURE TRAY) ×2 IMPLANT
LIQUID BAND (GAUZE/BANDAGES/DRESSINGS) ×2 IMPLANT
MANIPULATOR UTERINE 4.5 ZUMI (MISCELLANEOUS) ×2 IMPLANT
OCCLUDER COLPOPNEUMO (BALLOONS) ×2 IMPLANT
PEN SKIN MARKING BROAD (MISCELLANEOUS) ×2 IMPLANT
POUCH SPECIMEN RETRIEVAL 10MM (ENDOMECHANICALS) ×2 IMPLANT
SEAL CANN UNIV 5-8 DVNC XI (MISCELLANEOUS) ×4 IMPLANT
SEAL XI 5MM-8MM UNIVERSAL (MISCELLANEOUS) ×4
SET TUBE IRRIG SUCTION NO TIP (IRRIGATION / IRRIGATOR) ×2 IMPLANT
SHEET LAVH (DRAPES) ×2 IMPLANT
SOLUTION ELECTROLUBE (MISCELLANEOUS) ×2 IMPLANT
SUT VIC AB 0 CT1 27 (SUTURE) ×1
SUT VIC AB 0 CT1 27XBRD ANTBC (SUTURE) ×1 IMPLANT
SUT VIC AB 3-0 SH 27 (SUTURE) ×1
SUT VIC AB 3-0 SH 27X BRD (SUTURE) ×1 IMPLANT
SUT VIC AB 4-0 PS2 27 (SUTURE) ×4 IMPLANT
SYR 50ML LL SCALE MARK (SYRINGE) ×2 IMPLANT
TOWEL OR 17X26 10 PK STRL BLUE (TOWEL DISPOSABLE) ×4 IMPLANT
TOWEL OR NON WOVEN STRL DISP B (DISPOSABLE) ×2 IMPLANT
TRAP SPECIMEN MUCOUS 40CC (MISCELLANEOUS) ×2 IMPLANT
TRAY FOLEY W/METER SILVER 14FR (SET/KITS/TRAYS/PACK) ×2 IMPLANT
TRAY LAPAROSCOPIC (CUSTOM PROCEDURE TRAY) ×2 IMPLANT
TROCAR BLADELESS OPT 5 100 (ENDOMECHANICALS) ×2 IMPLANT
TROCAR XCEL 12X100 BLDLESS (ENDOMECHANICALS) ×2 IMPLANT
WATER STERILE IRR 1500ML POUR (IV SOLUTION) ×4 IMPLANT

## 2014-08-24 NOTE — H&P (Signed)
Consult Note: Gyn-Onc  Consult was requested by Dr. Gaetano Net for the evaluation of Melanie [name] 39 y.o. female With bilateral ovarian cysts CC:  Chief Complaint  Patient presents with  . OVARIAN Cyst Bilateral    Assessment/Plan:  Ms. Melanie [name] is a 39 y.o. year old with likely endometriomas of the ovaries - right >left.  I am recommending surgery with robotic bilateral ovarian cystectomies. I discussed with the patient that it is my recommendation that we entirely remove the right ovary given the size of the cyst on that ovary, however she would prefer to only remove the ovaries if necessary. I discussed that in tact removal is recommended until cancer is ruled out and if I do not believe I can reasonable remove the right ovarian cyst in tact without attempting RSO I will not perform a cystectomy. On the contralateral side (left) we will try to preserve remaining ovary for hormonal reasons. I discussed that she has a high risk for recurrence of endoemtriomas if we leave ovaries in situ.  I discussed that if the pelvic organs are adhesed, it is sometimes not possible to spare the uterus or ovaries (necessitating total hyst, BSO), but I would only do this if it was the most surgically safe approach.  I discussed that her pain may not be a result of the cysts and I could not guarantee resolution of symptoms with ovarian cystectomy.  I discussed operative risks including bleeding, infection, damage to internal organs (such as bladder,ureters, bowels), blood clot, reoperation and rehospitalization. I discussed the risk of conversion to lapartomy. I discussed all of the above risks are increased in a patient with endometriosis, adhesions and prior surgeries.  HPI: The patient is a 39 year old parous woman with a history of an endometrioma removal from the left ovary (15cm) in 2010 by Dr Skeet Latch. She is seen in consultation at the request of Dr Gaetano Net for new right ovarian cyst  measuring 8cm. The patient reports 3 months of intermittent, daily left lower quadrant pain. It is worse at 3pm. It is worse in the week before her cycle. She uses narcotics obtained "from the street" to allieviate the pain (including percocet and tramadol). She was seen by Dr Gaetano Net in April, 2016 and a transvaginal US was performed. It revealed a 7.8cm complex right ovarian cyst consistent with an endometrioma. It was simple in nature. There was an additional 4.9cm cyst on the right and a 2.7cm cysto on the right. The left ovary contained a 3cm cyst with low level echoes. There was no free fluid seen.  She has a history of an ex lap and left ovarian cystectomy in 2010 with Dr Skeet Latch. She has completed child bearing but desires to retain fertility if possible.   Interval History: She has been experiencing bouts of dizziness and light headedness. She was seen in the ER recently (last week) with no pathology idnetified.  Current Meds:  Outpatient Encounter Prescriptions as of 07/09/2014  Medication Sig  . aspirin 81 MG chewable tablet Chew 1 tablet (81 mg total) by mouth daily.  . meclizine (ANTIVERT) 25 MG tablet Take 1 tablet (25 mg total) by mouth 3 (three) times daily as needed for dizziness.  . Multiple Vitamin (MULTIVITAMIN WITH MINERALS) TABS Take 1 tablet by mouth daily.  . [DISCONTINUED] atorvastatin (LIPITOR) 40 MG tablet Take 1 tablet (40 mg total) by mouth daily at 6 PM. (Patient not taking: Reported on 07/09/2014)    Allergy:  Allergies  Allergen  Reactions  . Morphine And Related Nausea And Vomiting    Social Hx:  History   Social History  . Marital Status: Divorced    Spouse Name: N/A  . Number of Children: N/A  . Years of Education: N/A   Occupational History  . Not on file.   Social History Main Topics  . Smoking status: Never Smoker   . Smokeless tobacco: Not on file  . Alcohol Use: No  . Drug Use:  No  . Sexual Activity: Not on file   Other Topics Concern  . Not on file   Social History Narrative    Past Surgical Hx:  Past Surgical History  Procedure Laterality Date  . Brain tumor removed  2011  . Ovarian mass removed  2010    left  . Cholecystectomy  12/03/2011    Procedure: LAPAROSCOPIC CHOLECYSTECTOMY WITH INTRAOPERATIVE CHOLANGIOGRAM; Surgeon: Madilyn Hook, DO; Location: [facility]; Service: General; Laterality: N/A;    Past Medical Hx:  Past Medical History  Diagnosis Date  . TIA (transient ischemic attack)   . Seizures   . Brain tumor 2011  . Embolism - blood clot in left ovary  . Meningioma 08/14/2011    S/p craniotomy Encephaloma present non changing on MRI in 08/2011  . Anxiety   . Gallstones   . Asthma   . Clotting disorder   . Chills   . Unintentional weight loss   . Visual disturbance   . Palpitations   . Abdominal pain   . Rectal bleeding   . Nausea   . Rectal pain   . Blood in urine   . Weakness   . Confusion     Past Gynecological History: CS x 1 Patient's last menstrual period was 05/17/2014.  Family Hx:  Family History  Problem Relation Age of Onset  . Depression Mother   . Alcohol abuse Sister   . Alcohol abuse Brother   . Alzheimer's disease Maternal Grandmother   . Diabetes Maternal Grandmother   . Stroke Maternal Grandmother   . Heart failure Father   . Hypertension Father   . Heart disease Father     Review of Systems:  Constitutional  Feels well,  ENT Normal appearing ears and nares bilaterally Skin/Breast  No rash, sores, jaundice, itching, dryness Cardiovascular  No chest pain, shortness of breath, or edema  Pulmonary  No cough or wheeze.  Gastro Intestinal  No nausea, vomitting, or diarrhoea. No bright red blood per rectum, no abdominal pain, change in bowel  movement, or constipation.  Genito Urinary  No frequency, urgency, dysuria, see HPI Musculo Skeletal  No myalgia, arthralgia, joint swelling or pain  Neurologic  No weakness, numbness, change in gait,  Psychology  No depression, anxiety, insomnia.   Vitals: Blood pressure 142/86, pulse 63, temperature 98.2 F (36.8 C), temperature source Oral, resp. rate 20, height 5\' 2"  (1.575 m), weight 165 lb (74.844 kg), last menstrual period 05/17/2014.  Physical Exam: WD in NAD Neck  Supple NROM, without any enlargements.  Lymph Node Survey No cervical supraclavicular or inguinal adenopathy Cardiovascular  Pulse normal rate, regularity and rhythm. S1 and S2 normal.  Lungs  Clear to auscultation bilateraly, without wheezes/crackles/rhonchi. Good air movement.  Skin  No rash/lesions/breakdown  Psychiatry  Alert and oriented to person, place, and time  Abdomen  Normoactive bowel sounds, abdomen soft, non-tender and thin without evidence of hernia.  Back No CVA tenderness Genito Urinary  Vulva/vagina: Normal external female genitalia. No lesions. No discharge or  bleeding. Bladder/urethra: No lesions or masses, well supported bladder Vagina: no lesions Cervix: Normal appearing, no lesions. Uterus: Small, mobile, no parametrial involvement or nodularity. Adnexa: fullness appreciated in the right adnexa. Mobile.  Rectal  Good tone, no masses no cul de sac nodularity.  Extremities  No bilateral cyanosis, clubbing or edema.   Donaciano Eva, MD

## 2014-08-24 NOTE — Transfer of Care (Signed)
Immediate Anesthesia Transfer of Care Note  Patient: Melanie [name]  Procedure(s) Performed: Procedure(s): XI ROBOTIC ASSISTED BILATERAL LAPAROSCOPIC OVARIAN CYSTECTOMY RESECTION OF ENDOMETRIAL CYST (Bilateral)  Patient Location: PACU  Anesthesia Type:General  Level of Consciousness:  sedated, patient cooperative and responds to stimulation  Airway & Oxygen Therapy:Patient Spontanous Breathing and Patient connected to face mask oxgen  Post-op Assessment:  Report given to PACU RN and Post -op Vital signs reviewed and stable  Post vital signs:  Reviewed and stable  Last Vitals:  Filed Vitals:   08/24/14 1305  BP: 128/84  Pulse: 80  Temp: 36.8 C  Resp: 16    Complications: No apparent anesthesia complications

## 2014-08-24 NOTE — Discharge Instructions (Signed)
Unilateral Salpingo-Oophorectomy, Care After Refer to this sheet in the next few weeks. These instructions provide you with information on caring for yourself after your procedure. Your health care provider may also give you more specific instructions. Your treatment has been planned according to current medical practices, but problems sometimes occur. Call your health care provider if you have any problems or questions after your procedure. WHAT TO EXPECT AFTER THE PROCEDURE After your procedure, it is typical to have the following:  Abdominal pain that can be controlled with pain medicine.  Vaginal spotting.  Constipation. HOME CARE INSTRUCTIONS   Get plenty of rest and sleep.  Only take over-the-counter or prescription medicines as directed by your health care provider. Do not take aspirin. It can cause bleeding.  Keep incision areas clean and dry. Remove or change any bandages (dressings) only as directed by your health care provider.  Follow your health care provider's advice regarding diet.  Drink enough fluids to keep your urine clear or pale yellow.  Limit exercise and activities as directed by your health care provider. Do not lift anything heavier than 5 pounds (2.3 kg) until your health care provider approves.  Do not drive until your health care provider approves.  Do not drink alcohol until your health care provider approves.  Do not have sexual intercourse until your health care provider says it is OK.  Take your temperature twice a day and write it down.  If you become constipated, you may:  Ask your health care provider about taking a mild laxative.  Add more fruit and bran to your diet.  Drink more fluids.  Follow up with your health care provider as directed. SEEK MEDICAL CARE IF:   You have swelling or redness in the incision area.  You develop a rash.  You feel lightheaded.  You have pain that is not controlled with medicine.  You have pain,  swelling, or redness where the IV access tube was placed. SEEK IMMEDIATE MEDICAL CARE IF:  You have a fever.  You develop increasing abdominal pain.  You see pus coming out of the incision, or the incision is separating.  You notice a bad smell coming from the wound or dressing.  You have excessive vaginal bleeding.  You feel sick to your stomach (nauseous) and vomit.  You have leg or chest pain.  You have pain when you urinate.  You develop shortness of breath.  You pass out. Document Released: 12/23/2008 Document Revised: 12/17/2012 Document Reviewed: 08/20/2012 [facility] Patient Information 2015 [place], [place]. This information is not intended to replace advice given to you by your health care provider. Make sure you discuss any questions you have with your health care provider.   Return to work: 2 weeks  Activity: 1. Be up and out of the bed during the day.  Take a nap if needed.  You may walk up steps but be careful and use the hand rail.  Stair climbing will tire you more than you think, you may need to stop part way and rest.   2. No lifting or straining for 6 weeks.  3. No driving for 1-2 weeks.  Do Not drive if you are taking narcotic pain medicine.  4. Shower daily.  Use soap and water on your incision and pat dry; don't rub.   5. No sexual activity and nothing in the vagina for 4 weeks.  Diet: 1. Low sodium Heart Healthy Diet is recommended.  2. It is safe to use a laxative  if you have difficulty moving your bowels.   Wound Care: 1. Keep clean and dry.  Shower daily.  Reasons to call the Doctor:   Fever - Oral temperature greater than 100.4 degrees Fahrenheit  Foul-smelling vaginal discharge  Difficulty urinating  Nausea and vomiting  Increased pain at the site of the incision that is unrelieved with pain medicine.  Difficulty breathing with or without chest pain  New calf pain especially if only on one side  Sudden, continuing increased  vaginal bleeding with or without clots.   Follow-up: 1. See Everitt Amber in 4 weeks.  Contacts: For questions or concerns you should contact:  Dr. Everitt Amber at [phone]  or at [place]

## 2014-08-24 NOTE — Op Note (Signed)
OPERATIVE NOTE  Date: 08/24/14  Preoperative Diagnosis: bilateral ovarian cysts   Postoperative Diagnosis:  Stage IV endometriosis, bilateral endometriomas  Procedure(s) Performed: Robotic-assisted laparoscopic right salpingo-oophorectomy, left ovarian cystectomy, right ureterolysis for retroperitoneal fibrosis, excision of endometriotic implants, lysis of adhesions  Surgeon: Everitt Amber, M.D.  Assistant Surgeon: Lahoma Crocker M.D. (an MD assistant was necessary for tissue manipulation, management of robotic instrumentation, retraction and positioning due to the complexity of the case and hospital policies).   Anesthesia: Gen. endotracheal.  Specimens: pelvic washings, right tube and ovary, left ovarian cysts, endometriotic implants. Estimated Blood Loss: 200 mL. Blood Replacement: None  Complications: none  Indication for Procedure:  Bilateral ovarian cysts, elevated CA 125, pelvic pain  Operative Findings: stage IV endometriosis with bilateral endometriomas (right side was an 8cm endometrioma growing retroperitoneally, adherent to the right ureter with no salvagable right ovarian tissue, left side contained 2 3cm endometriomas and a 2cm corpus luteum). 10 powderburn endometriotic implants on bilateral uterosacral ligaments and bilateral round ligament insertions and left anterior cul de sac (all excised). Uterosacral ligaments densely adherent to eachother in the midline from adhesions. Rectovaginal septum obliterated by endometriosis. No visible endometriosis present at the completion of the case. Frozen section of right tube and ovary revealed endometriosis.  Frozen pathology was consistent with endometrioma  Procedure: The patient's taken to the operating room and placed under general endotracheal anesthesia testing difficulty. She is placed in a dorsolithotomy position and cervical acromial pad was placed. The arms were tucked with care taken to pad the olecranon process. And  prepped and draped in usual sterile fashion. A uterine manipulator (zumi) was placed vaginally. A 58mm incision was made in the left upper quadrant palmer's point and a 5 mm Optiview trocar used to enter the abdomen under direct visualization. With entry into the abdomen and then maintenance of 15 mm of mercury the patient was placed in Trendelenburg position. An incision was made in the umbilicus and an 23mm trochar was placed through this site. Two incisions were made lateral to the umbilical incision in the left and right abdomen measuring 76mm. These incisions were made approximately 10 cm lateral to the umbilical incision. 8 mm robotic trochars were inserted. A left lower quadrant 23mm incision was made and a robotic port was placed through this. The robot was docked.  The abdomen was inspected as was the pelvis.  Pelvic washings were obtained. Extensive adhesiolysis with sharp dissection took place to restore normal anatomy from distortion created by endometriosis and adhesive disease. When no viable right ovarian tissue was identified, a decision was made to proceed with right salpingo-oophorectomy. An incision was made on the right pelvic side wall peritoneum parallel to the IP ligament and the retroperitoneal space entered. The right ureter was identified and the para-rectal space was developed. A window was created in the right broad ligament above the ureter. The right infundibulopelvic vessels were skeletonized cauterized and transected. Careful ureterolysis using meticulous sharp and blunt dissection was performed to free the right ureter in the pelvic retroperitoneum from its dense adhesions to the right broad ligament and ovary. The right round ligament was sacrificed in order to achieve adequate mobilization of the ovary to reflect it medially off the right side wall. The utero-ovarian ligaments similarly were cauterized with bipolar energy and transected. This required multiple passes secondary to the  dense vascularity supplying the right ovarian mass. The peritonal adhesions to the rectum from the right ovary were taken down with sharp dissection and the right  ovary was freed. The specimen was placed in an Endo Catch bag removed and sent for frozen section.  The left ovary was closely inspected. It was densely adherent to the left uterine lower uterine segment and uterosacral ligament. It contained 3 cysts. The ovarian adhesions were lysed with sharp dissection to mobilize the ovary somewhat. The ovarian cortex overlying the cysts was incised with the monopolar shears and the ovarian cysts were resected from the overlying cortex with care to preserve as much normal ovarian tissue as possible. They were sent to pathology as permanent specimens. The abdomen was copiously irrigated and drained and all operative sites inspected and hemostasis was assured. Hemostasis was reinforced with floseal.  The powderburn endometriotic implants were sharply dissected from the underlying tissues using robotic scissors. No residual visible endometriotic lesions were seen at the completion of the procedure.  The robot was undocked. The camera was placed through the left upper abdominal incision.The left ovarian cyst tissue which had been placed in an endocatch bag was retrieved.  The ports were all removed. The fascial closure at the left upper quadrant port was made with 0 Vicryl.  All incisions were closed with a running subcuticular Monocryl suture. Dermabond was applied. Sponge, lap and needle counts were correct x 3.    The patient had sequential compression devices for VTE prophylaxis.         Disposition: PACU         Condition: stable  Donaciano Eva, MD

## 2014-08-24 NOTE — Anesthesia Procedure Notes (Signed)
Procedure Name: Intubation Date/Time: 08/24/2014 7:38 PM Performed by: Hemi Chacko, Virgel Gess Pre-anesthesia Checklist: Patient identified, Emergency Drugs available, Suction available, Patient being monitored and Timeout performed Patient Re-evaluated:Patient Re-evaluated prior to inductionOxygen Delivery Method: Circle system utilized Preoxygenation: Pre-oxygenation with 100% oxygen Intubation Type: IV induction Ventilation: Mask ventilation without difficulty Laryngoscope Size: Mac and 4 Grade View: Grade II Tube type: Oral Tube size: 7.5 mm Number of attempts: 1 Airway Equipment and Method: Stylet Placement Confirmation: ETT inserted through vocal cords under direct vision,  positive ETCO2,  CO2 detector and breath sounds checked- equal and bilateral Secured at: 22 cm Tube secured with: Tape Dental Injury: Teeth and Oropharynx as per pre-operative assessment

## 2014-08-24 NOTE — Anesthesia Preprocedure Evaluation (Signed)
Anesthesia Evaluation  Patient identified by MRN, date of birth, ID band Patient awake    Reviewed: Allergy & Precautions, NPO status , Patient's Chart, lab work & pertinent test results  History of Anesthesia Complications (+) PONV  Airway Mallampati: II  TM Distance: >3 FB Neck ROM: Full    Dental no notable dental hx.    Pulmonary asthma ,  breath sounds clear to auscultation  Pulmonary exam normal       Cardiovascular negative cardio ROS Normal cardiovascular examRhythm:Regular Rate:Normal     Neuro/Psych Seizures -,  H/o brain tumor excision 2011. No seizures since TIAnegative neurological ROS  negative psych ROS   GI/Hepatic negative GI ROS, Neg liver ROS,   Endo/Other  negative endocrine ROS  Renal/GU negative Renal ROS  negative genitourinary   Musculoskeletal negative musculoskeletal ROS (+)   Abdominal   Peds negative pediatric ROS (+)  Hematology negative hematology ROS (+)   Anesthesia Other Findings   Reproductive/Obstetrics negative OB ROS                             Anesthesia Physical Anesthesia Plan  ASA: II  Anesthesia Plan: General   Post-op Pain Management:    Induction: Intravenous  Airway Management Planned: Oral ETT  Additional Equipment:   Intra-op Plan:   Post-operative Plan: Extubation in OR  Informed Consent: I have reviewed the patients History and Physical, chart, labs and discussed the procedure including the risks, benefits and alternatives for the proposed anesthesia with the patient or authorized representative who has indicated his/her understanding and acceptance.   Dental advisory given  Plan Discussed with: CRNA  Anesthesia Plan Comments:         Anesthesia Quick Evaluation

## 2014-08-25 ENCOUNTER — Encounter (HOSPITAL_COMMUNITY): Payer: Self-pay | Admitting: Gynecologic Oncology

## 2014-08-25 DIAGNOSIS — N803 Endometriosis of pelvic peritoneum: Secondary | ICD-10-CM | POA: Diagnosis not present

## 2014-08-25 LAB — BASIC METABOLIC PANEL
ANION GAP: 4 — AB (ref 5–15)
BUN: 7 mg/dL (ref 6–20)
CO2: 25 mmol/L (ref 22–32)
Calcium: 8 mg/dL — ABNORMAL LOW (ref 8.9–10.3)
Chloride: 106 mmol/L (ref 101–111)
Creatinine, Ser: 0.65 mg/dL (ref 0.44–1.00)
GFR calc non Af Amer: >60 mL/min (ref >60)
Glucose, Bld: 186 mg/dL — ABNORMAL HIGH (ref 65–99)
POTASSIUM: 3.2 mmol/L — AB (ref 3.5–5.1)
Sodium: 135 mmol/L (ref 135–145)

## 2014-08-25 LAB — CBC
HEMATOCRIT: 36.2 % (ref 36.0–46.0)
Hemoglobin: 12 g/dL (ref 12.0–15.0)
MCH: 28 pg (ref 26.0–34.0)
MCHC: 33.1 g/dL (ref 30.0–36.0)
MCV: 84.6 fL (ref 78.0–100.0)
Platelets: 227 10*3/uL (ref 150–400)
RBC: 4.28 MIL/uL (ref 3.87–5.11)
RDW: 12.8 % (ref 11.5–15.5)
WBC: 15.6 10*3/uL — AB (ref 4.0–10.5)

## 2014-08-25 MED ORDER — ONDANSETRON HCL 4 MG PO TABS: 4.0000 mg | ORAL_TABLET | Freq: Four times a day (QID) | ORAL | Status: DC | PRN

## 2014-08-25 MED ORDER — SIMETHICONE 80 MG PO CHEW: 80.0000 mg | CHEWABLE_TABLET | Freq: Four times a day (QID) | ORAL | Status: DC | PRN

## 2014-08-25 MED ORDER — KCL IN DEXTROSE-NACL 20-5-0.45 MEQ/L-%-% IV SOLN
INTRAVENOUS | Status: DC
  Administered 2014-08-25: 05:00:00 via INTRAVENOUS
  Filled 2014-08-25 (×2): qty 1000

## 2014-08-25 MED ORDER — DIPHENHYDRAMINE HCL 50 MG PO CAPS: 50.0000 mg | ORAL_CAPSULE | Freq: Every day | ORAL | Status: DC | PRN

## 2014-08-25 MED ORDER — ZOLPIDEM TARTRATE 5 MG PO TABS: 5.0000 mg | ORAL_TABLET | Freq: Every evening | ORAL | Status: DC | PRN

## 2014-08-25 MED ORDER — MECLIZINE HCL 25 MG PO TABS: 25.0000 mg | ORAL_TABLET | Freq: Three times a day (TID) | ORAL | Status: DC | PRN

## 2014-08-25 MED ORDER — TRAMADOL HCL 50 MG PO TABS: 50.0000 mg | ORAL_TABLET | Freq: Four times a day (QID) | ORAL | Status: DC | PRN

## 2014-08-25 MED ORDER — IBUPROFEN 400 MG PO TABS
400.0000 mg | ORAL_TABLET | Freq: Three times a day (TID) | ORAL | Status: DC | PRN
Start: 2014-08-25 — End: 2018-12-18

## 2014-08-25 MED ORDER — DOCUSATE SODIUM 100 MG PO CAPS: 100.0000 mg | ORAL_CAPSULE | Freq: Two times a day (BID) | ORAL | Status: DC

## 2014-08-25 MED ORDER — PANTOPRAZOLE SODIUM 40 MG PO TBEC: 40.0000 mg | DELAYED_RELEASE_TABLET | Freq: Every day | ORAL | Status: DC

## 2014-08-25 MED ORDER — ADULT MULTIVITAMIN W/MINERALS CH
1.0000 | ORAL_TABLET | Freq: Every morning | ORAL | Status: DC
  Administered 2014-08-25: 1 via ORAL
  Filled 2014-08-25: qty 1

## 2014-08-25 MED ORDER — SIMETHICONE 80 MG PO CHEW
80.0000 mg | CHEWABLE_TABLET | Freq: Four times a day (QID) | ORAL | Status: DC | PRN
  Filled 2014-08-25: qty 1

## 2014-08-25 MED ORDER — DIPHENHYDRAMINE HCL 25 MG PO CAPS: 50.0000 mg | ORAL_CAPSULE | Freq: Every day | ORAL | Status: DC | PRN

## 2014-08-25 MED ORDER — DOCUSATE SODIUM 100 MG PO CAPS
100.0000 mg | ORAL_CAPSULE | Freq: Two times a day (BID) | ORAL | Status: DC
  Administered 2014-08-25: 100 mg via ORAL
  Filled 2014-08-25 (×2): qty 1

## 2014-08-25 MED ORDER — IBUPROFEN 200 MG PO TABS
400.0000 mg | ORAL_TABLET | Freq: Three times a day (TID) | ORAL | Status: DC | PRN
Start: 2014-08-25 — End: 2014-08-25

## 2014-08-25 MED ORDER — ADULT MULTIVITAMIN W/MINERALS CH: 1.0000 | ORAL_TABLET | Freq: Every morning | ORAL | Status: DC

## 2014-08-25 MED ORDER — ONDANSETRON HCL 4 MG/2ML IJ SOLN: 4.0000 mg | Freq: Four times a day (QID) | INTRAMUSCULAR | Status: DC | PRN

## 2014-08-25 MED ORDER — PANTOPRAZOLE SODIUM 40 MG PO TBEC
40.0000 mg | DELAYED_RELEASE_TABLET | Freq: Every day | ORAL | Status: DC
  Administered 2014-08-25: 40 mg via ORAL
  Filled 2014-08-25: qty 1

## 2014-08-25 MED ORDER — MECLIZINE HCL 25 MG PO TABS
25.0000 mg | ORAL_TABLET | Freq: Three times a day (TID) | ORAL | Status: DC | PRN
  Filled 2014-08-25: qty 1

## 2014-08-27 NOTE — Anesthesia Postprocedure Evaluation (Signed)
  Anesthesia Post-op Note  Patient: Melanie [name]  Procedure(s) Performed: Procedure(s) (LRB): XI ROBOTIC ASSISTED BILATERAL LAPAROSCOPIC OVARIAN CYSTECTOMY RESECTION OF ENDOMETRIAL CYST (Bilateral)  Patient Location: PACU  Anesthesia Type: General  Level of Consciousness: awake and alert   Airway and Oxygen Therapy: Patient Spontanous Breathing  Post-op Pain: mild  Post-op Assessment: Post-op Vital signs reviewed, Patient's Cardiovascular Status Stable, Respiratory Function Stable, Patent Airway and No signs of Nausea or vomiting  Last Vitals:  Filed Vitals:   08/25/14 0446  BP: 112/56  Pulse: 66  Temp: 36.8 C  Resp: 16    Post-op Vital Signs: stable   Complications: No apparent anesthesia complications

## 2014-09-23 ENCOUNTER — Encounter: Payer: Self-pay | Admitting: Gynecologic Oncology

## 2014-09-23 ENCOUNTER — Ambulatory Visit: Payer: Medicare Other | Attending: Gynecologic Oncology | Admitting: Gynecologic Oncology

## 2014-09-23 VITALS — BP 129/54 | HR 77 | Temp 98.6°F | Resp 20 | Ht 62.0 in | Wt 165.2 lb

## 2014-09-23 DIAGNOSIS — N801 Endometriosis of ovary: Secondary | ICD-10-CM | POA: Insufficient documentation

## 2014-09-23 DIAGNOSIS — N80109 Endometriosis of ovary, unspecified side, unspecified depth: Secondary | ICD-10-CM

## 2014-09-23 NOTE — Patient Instructions (Signed)
You do not need to schedule a followup appt with Dr. Denman George. Please call us in the future with any questions or concerns.

## 2014-09-23 NOTE — Progress Notes (Signed)
POSTOPERATIVE FOLLOWUP  Assessment and Plan:  Stage IV endometriosis, s/p robotic RSO and left ovarian cystectomy and excision of peritoneal implants on 08/24/14. Recommend medical therapy with either Mirena IUD or OCP suppression. The patient will followup with her referring OBGYN for ongoing endometriosis management. She will return to see me on a prn basis.  HPI:  Melanie [name] is a 39 y.o. year old initially seen in consultation on 07/09/14, referred by Dr Gaetano Net for ovarian cysts and pelvic pain.  She then underwent a robotic-assisted laparoscopic right salpingo-oophorectomy, left ovarian cystectomy, right ureterolysis for retroperitoneal fibrosis, excision of endometriotic implants, lysis of adhesions on 0/34/74 without complications. Intraoperative findings included stage IV endometriosis with bilateral endometriomas (right side was an 8cm endometrioma growing retroperitoneally, adherent to the right ureter with no salvagable right ovarian tissue, left side contained 2 3cm endometriomas and a 2cm corpus luteum). 10 powderburn endometriotic implants on bilateral uterosacral ligaments and bilateral round ligament insertions and left anterior cul de sac (all excised). Uterosacral ligaments densely adherent to eachother in the midline from adhesions. Rectovaginal septum obliterated by endometriosis. No visible endometriosis present at the completion of the case. Her postoperative course was uncomplicated.  Her final pathology revealed bilateral endometriotic cysts and peritoneal endometriosis.  She is seen today for a postoperative check and to discuss her pathology results and ongoing plan.  Since discharge from the hospital, she is feeling well. She has significantly less pain than preop.  She has improving appetite, normal bowel and bladder function, and pain controlled with minimal PO medication. She has no other complaints today.    Review of systems: Constitutional:  She has no weight  gain or weight loss. She has no fever or chills. Eyes: No blurred vision Ears, Nose, Mouth, Throat: No dizziness, headaches or changes in hearing. No mouth sores. Cardiovascular: No chest pain, palpitations or edema. Respiratory:  No shortness of breath, wheezing or cough Gastrointestinal: She has normal bowel movements without diarrhea or constipation. She denies any nausea or vomiting. She denies blood in her stool or heart burn. Genitourinary:  She denies pelvic pain, pelvic pressure or changes in her urinary function. She has no hematuria, dysuria, or incontinence. She has no irregular vaginal bleeding or vaginal discharge Musculoskeletal: Denies muscle weakness or joint pains.  Skin:  She has no skin changes, rashes or itching Neurological:  Denies dizziness or headaches. No neuropathy, no numbness or tingling. Psychiatric:  She denies depression or anxiety. Hematologic/Lymphatic:   No easy bruising or bleeding   Physical Exam: Blood pressure 129/54, pulse 77, temperature 98.6 F (37 C), temperature source Oral, resp. rate 20, height 5\' 2"  (1.575 m), weight 165 lb 3.2 oz (74.934 kg), SpO2 100 %. General: Well dressed, well nourished in no apparent distress.   HEENT:  Normocephalic and atraumatic, no lesions.  Extraocular muscles intact. Sclerae anicteric. Pupils equal, round, reactive. No mouth sores or ulcers. Thyroid is normal size, not nodular, midline. Skin:  No lesions or rashes. Breasts:  Soft, symmetric.  No skin or nipple changes.  No palpable LN or masses. Lungs:  Clear to auscultation bilaterally.  No wheezes. Cardiovascular:  Regular rate and rhythm.  No murmurs or rubs. Abdomen:  Soft, nontender, nondistended.  No palpable masses.  No hepatosplenomegaly.  No ascites. Normal bowel sounds.  No hernias.  Incisions are well healed Genitourinary: Normal EGBUS  Vaginal cuff intact.  No bleeding or discharge.  No cul de sac fullness. Extremities: No cyanosis, clubbing or edema.  No  calf  tenderness or erythema. No palpable cords. Psychiatric: Mood and affect are appropriate. Neurological: Awake, alert and oriented x 3. Sensation is intact, no neuropathy.  Musculoskeletal: No pain, normal strength and range of motion.  Donaciano Eva, MD

## 2015-06-28 ENCOUNTER — Emergency Department (HOSPITAL_COMMUNITY)
Admission: EM | Admit: 2015-06-28 | Discharge: 2015-06-28 | Disposition: A | Discharge status: Home / Self Care | Payer: Medicare Other | Attending: Emergency Medicine | Admitting: Emergency Medicine

## 2015-06-28 ENCOUNTER — Encounter (HOSPITAL_COMMUNITY): Payer: Self-pay | Admitting: Emergency Medicine

## 2015-06-28 DIAGNOSIS — J45909 Unspecified asthma, uncomplicated: Secondary | ICD-10-CM | POA: Insufficient documentation

## 2015-06-28 DIAGNOSIS — R197 Diarrhea, unspecified: Secondary | ICD-10-CM | POA: Diagnosis not present

## 2015-06-28 DIAGNOSIS — R112 Nausea with vomiting, unspecified: Secondary | ICD-10-CM | POA: Diagnosis not present

## 2015-06-28 DIAGNOSIS — Z79899 Other long term (current) drug therapy: Secondary | ICD-10-CM | POA: Insufficient documentation

## 2015-06-28 DIAGNOSIS — Z8659 Personal history of other mental and behavioral disorders: Secondary | ICD-10-CM | POA: Insufficient documentation

## 2015-06-28 DIAGNOSIS — Z86711 Personal history of pulmonary embolism: Secondary | ICD-10-CM | POA: Diagnosis not present

## 2015-06-28 DIAGNOSIS — Z7982 Long term (current) use of aspirin: Secondary | ICD-10-CM | POA: Diagnosis not present

## 2015-06-28 DIAGNOSIS — Z8673 Personal history of transient ischemic attack (TIA), and cerebral infarction without residual deficits: Secondary | ICD-10-CM | POA: Diagnosis not present

## 2015-06-28 DIAGNOSIS — Z3202 Encounter for pregnancy test, result negative: Secondary | ICD-10-CM | POA: Insufficient documentation

## 2015-06-28 DIAGNOSIS — K529 Noninfective gastroenteritis and colitis, unspecified: Secondary | ICD-10-CM | POA: Diagnosis not present

## 2015-06-28 LAB — COMPREHENSIVE METABOLIC PANEL
ALK PHOS: 55 U/L (ref 38–126)
ALT: 15 U/L (ref 14–54)
ANION GAP: 10 (ref 5–15)
AST: 17 U/L (ref 15–41)
Albumin: 4 g/dL (ref 3.5–5.0)
BILIRUBIN TOTAL: 0.4 mg/dL (ref 0.3–1.2)
BUN: 13 mg/dL (ref 6–20)
CALCIUM: 9.4 mg/dL (ref 8.9–10.3)
CO2: 22 mmol/L (ref 22–32)
Chloride: 108 mmol/L (ref 101–111)
Creatinine, Ser: 0.67 mg/dL (ref 0.44–1.00)
GFR calc Af Amer: >60 mL/min (ref >60)
GFR calc non Af Amer: >60 mL/min (ref >60)
Glucose, Bld: 108 mg/dL — ABNORMAL HIGH (ref 65–99)
Potassium: 4.3 mmol/L (ref 3.5–5.1)
SODIUM: 140 mmol/L (ref 135–145)
TOTAL PROTEIN: 7.9 g/dL (ref 6.5–8.1)

## 2015-06-28 LAB — CBC
HCT: 43.5 % (ref 36.0–46.0)
Hemoglobin: 14.5 g/dL (ref 12.0–15.0)
MCH: 28.5 pg (ref 26.0–34.0)
MCHC: 33.3 g/dL (ref 30.0–36.0)
MCV: 85.5 fL (ref 78.0–100.0)
PLATELETS: 263 10*3/uL (ref 150–400)
RBC: 5.09 MIL/uL (ref 3.87–5.11)
RDW: 13 % (ref 11.5–15.5)
WBC: 9.7 10*3/uL (ref 4.0–10.5)

## 2015-06-28 LAB — URINALYSIS, ROUTINE W REFLEX MICROSCOPIC
BILIRUBIN URINE: NEGATIVE
Glucose, UA: NEGATIVE mg/dL
KETONES UR: NEGATIVE mg/dL
Leukocytes, UA: NEGATIVE
NITRITE: NEGATIVE
PROTEIN: NEGATIVE mg/dL
SPECIFIC GRAVITY, URINE: 1.025 (ref 1.005–1.030)
pH: 5 (ref 5.0–8.0)

## 2015-06-28 LAB — URINE MICROSCOPIC-ADD ON: WBC, UA: NONE SEEN WBC/hpf (ref 0–5)

## 2015-06-28 LAB — I-STAT BETA HCG BLOOD, ED (MC, WL, AP ONLY): I-stat hCG, quantitative: <5 m[IU]/mL (ref <5)

## 2015-06-28 LAB — LIPASE, BLOOD: Lipase: 32 U/L (ref 11–51)

## 2015-06-28 MED ORDER — SODIUM CHLORIDE 0.9 % IV BOLUS (SEPSIS)
1000.0000 mL | Freq: Once | INTRAVENOUS | Status: AC
  Administered 2015-06-28: 1000 mL via INTRAVENOUS

## 2015-06-28 MED ORDER — ONDANSETRON 4 MG PO TBDP: ORAL_TABLET | ORAL | Status: DC

## 2015-06-28 MED ORDER — ONDANSETRON HCL 4 MG/2ML IJ SOLN
4.0000 mg | Freq: Once | INTRAMUSCULAR | Status: AC
  Administered 2015-06-28: 4 mg via INTRAVENOUS
  Filled 2015-06-28: qty 2

## 2015-06-28 NOTE — ED Provider Notes (Signed)
CSN: [id number]     Arrival date & time 06/28/15  0601 History   First MD Initiated Contact with Patient 06/28/15 [phone]     Chief Complaint  Patient presents with  . Emesis  . Diarrhea  . Chills     (Consider location/radiation/quality/duration/timing/severity/associated sxs/prior Treatment) HPI  40 year old female presents with vomiting and diarrhea since around 1 AM. Patient states it woke her up out of sleep. She has had multiple loose, watery stools. No blood. Her nausea feels worse whenever she lays flat and better when sitting up. No fevers or abdominal pain. Patient is concerned that she is dehydrated because her head feels "funny". Patient denies any recent antibiotic use or traveling. Patient states she has been around multiple sick contacts with similar symptoms, including direct contact with a child with vomiting and diarrhea.  Past Medical History  Diagnosis Date  . TIA (transient ischemic attack)   . Brain tumor ([place]) 2011  . Embolism - blood clot in left ovary  . Meningioma ([place]) 08/14/2011    S/p craniotomy Encephaloma present non changing on MRI in 08/2011  . Gallstones   . Clotting disorder ([place])   . Chills   . Unintentional weight loss   . Visual disturbance   . Palpitations   . Abdominal pain   . Rectal bleeding   . Nausea   . Rectal pain   . Blood in urine   . Weakness   . Confusion   . Complication of anesthesia     pt states is severe   . PONV (postoperative nausea and vomiting)   . Asthma     hx of as teenager; induced with running now   . Seizures ([place])     last one 2011  . Headache     rarely has but currently has one this am   . Depression     noted in several past MD notes/hosp notes pt to have major depressive disorder - pt denies   . Anxiety     pt denies - noted in several MD/hosp notes    Past Surgical History  Procedure Laterality Date  . Brain tumor removed  2011  . Ovarian mass removed  2010    left  . Cholecystectomy  12/03/2011   Procedure: LAPAROSCOPIC CHOLECYSTECTOMY WITH INTRAOPERATIVE CHOLANGIOGRAM;  Surgeon: Madilyn Hook, DO;  Location: [facility];  Service: General;  Laterality: N/A;  . Brain surgery    . Laparoscopic ovarian cystectomy Bilateral 08/24/2014    Procedure: XI ROBOTIC ASSISTED BILATERAL LAPAROSCOPIC OVARIAN CYSTECTOMY RESECTION OF ENDOMETRIAL CYST;  Surgeon: Everitt Amber, MD;  Location: [facility];  Service: Gynecology;  Laterality: Bilateral;   Family History  Problem Relation Age of Onset  . Depression Mother   . Alcohol abuse Sister   . Alcohol abuse Brother   . Alzheimer's disease Maternal Grandmother   . Diabetes Maternal Grandmother   . Stroke Maternal Grandmother   . Heart failure Father   . Hypertension Father   . Heart disease Father    Social History  Substance Use Topics  . Smoking status: Never Smoker   . Smokeless tobacco: Never Used  . Alcohol Use: No   OB History    No data available     Review of Systems  Constitutional: Positive for chills. Negative for fever.  Respiratory: Negative for shortness of breath.   Gastrointestinal: Positive for nausea, vomiting and diarrhea. Negative for abdominal pain and blood in stool.  All other systems reviewed and are negative.  Allergies  Morphine and related  Home Medications   Prior to Admission medications   Medication Sig Start Date End Date Taking? Authorizing Provider  aspirin 81 MG chewable tablet Chew 1 tablet (81 mg total) by mouth daily. 08/28/13  Yes Patrecia Pour, MD  ibuprofen (ADVIL,MOTRIN) 400 MG tablet Take 1 tablet (400 mg total) by mouth every 8 (eight) hours as needed for headache or moderate pain. 08/25/14  Yes Everitt Amber, MD  meclizine (ANTIVERT) 25 MG tablet Take 1 tablet (25 mg total) by mouth 3 (three) times daily as needed for dizziness. 08/25/14  Yes Everitt Amber, MD  Multiple Vitamin (MULTIVITAMIN WITH MINERALS) TABS tablet Take 1 tablet by mouth every morning. 08/25/14  Yes Everitt Amber, MD   BP 125/74 mmHg   Pulse 90  Temp(Src) 97.8 F (36.6 C) (Oral)  Resp 20  Ht 5\' 1"  (1.549 m)  Wt 160 lb (72.576 kg)  BMI 30.25 kg/m2  SpO2 98%  LMP 05/27/2015 (Approximate) Physical Exam  Constitutional: She is oriented to person, place, and time. She appears well-developed and well-nourished.  HENT:  Head: Normocephalic and atraumatic.  Right Ear: External ear normal.  Left Ear: External ear normal.  Nose: Nose normal.  Mouth/Throat: Oropharynx is clear and moist.  Eyes: Right eye exhibits no discharge. Left eye exhibits no discharge.  Cardiovascular: Normal rate.   Pulmonary/Chest: Effort normal.  Abdominal: Soft. She exhibits no distension. There is no tenderness.  Neurological: She is alert and oriented to person, place, and time.  Skin: Skin is warm and dry. She is not diaphoretic.  Nursing note and vitals reviewed.   ED Course  Procedures (including critical care time) Labs Review Labs Reviewed  COMPREHENSIVE METABOLIC PANEL - Abnormal; Notable for the following:    Glucose, Bld 108 (*)    All other components within normal limits  URINALYSIS, ROUTINE W REFLEX MICROSCOPIC (NOT AT [facility]) - Abnormal; Notable for the following:    Hgb urine dipstick TRACE (*)    All other components within normal limits  URINE MICROSCOPIC-ADD ON - Abnormal; Notable for the following:    Squamous Epithelial / LPF 0-5 (*)    Bacteria, UA RARE (*)    All other components within normal limits  LIPASE, BLOOD  CBC  I-STAT BETA HCG BLOOD, ED ([facility], [facility], [facility] ONLY)    Imaging Review No results found. I have personally reviewed and evaluated these images and lab results as part of my medical decision-making.   EKG Interpretation None      MDM   Final diagnoses:  Gastroenteritis    Patient symptoms are consistent with a viral gastroenteritis. She feels better with fluids and Zofran. No vomiting while in the ED. Abdominal exam is benign. Will discharge with Zofran, encourage fluid intake at home, and  discussed return precautions.    Sherwood Gambler, MD 06/28/15 [phone]

## 2015-06-28 NOTE — ED Notes (Signed)
MD at bedside. 

## 2015-06-28 NOTE — ED Notes (Signed)
Pt. woke up this morning with emesis , diarrhea and chills , denies fever or abdominal pain .

## 2016-04-05 ENCOUNTER — Encounter (HOSPITAL_COMMUNITY): Payer: Self-pay

## 2016-04-05 ENCOUNTER — Emergency Department (HOSPITAL_COMMUNITY): Payer: Medicare Other

## 2016-04-05 DIAGNOSIS — Z79899 Other long term (current) drug therapy: Secondary | ICD-10-CM | POA: Diagnosis not present

## 2016-04-05 DIAGNOSIS — R0789 Other chest pain: Secondary | ICD-10-CM | POA: Insufficient documentation

## 2016-04-05 DIAGNOSIS — J45909 Unspecified asthma, uncomplicated: Secondary | ICD-10-CM | POA: Insufficient documentation

## 2016-04-05 DIAGNOSIS — J111 Influenza due to unidentified influenza virus with other respiratory manifestations: Secondary | ICD-10-CM | POA: Insufficient documentation

## 2016-04-05 DIAGNOSIS — R05 Cough: Secondary | ICD-10-CM | POA: Diagnosis present

## 2016-04-05 DIAGNOSIS — R0602 Shortness of breath: Secondary | ICD-10-CM | POA: Diagnosis not present

## 2016-04-05 DIAGNOSIS — Z8673 Personal history of transient ischemic attack (TIA), and cerebral infarction without residual deficits: Secondary | ICD-10-CM | POA: Diagnosis not present

## 2016-04-05 DIAGNOSIS — Z7982 Long term (current) use of aspirin: Secondary | ICD-10-CM | POA: Diagnosis not present

## 2016-04-05 LAB — CBC
HEMATOCRIT: 40.6 % (ref 36.0–46.0)
HEMOGLOBIN: 13.4 g/dL (ref 12.0–15.0)
MCH: 28.4 pg (ref 26.0–34.0)
MCHC: 33 g/dL (ref 30.0–36.0)
MCV: 86 fL (ref 78.0–100.0)
Platelets: 216 10*3/uL (ref 150–400)
RBC: 4.72 MIL/uL (ref 3.87–5.11)
RDW: 13 % (ref 11.5–15.5)
WBC: 5.6 10*3/uL (ref 4.0–10.5)

## 2016-04-05 LAB — I-STAT TROPONIN, ED: Troponin i, poc: 0 ng/mL (ref 0.00–0.08)

## 2016-04-05 LAB — BASIC METABOLIC PANEL
ANION GAP: 9 (ref 5–15)
BUN: 12 mg/dL (ref 6–20)
CO2: 23 mmol/L (ref 22–32)
Calcium: 8.9 mg/dL (ref 8.9–10.3)
Chloride: 105 mmol/L (ref 101–111)
Creatinine, Ser: 0.68 mg/dL (ref 0.44–1.00)
GFR calc Af Amer: >60 mL/min (ref >60)
Glucose, Bld: 127 mg/dL — ABNORMAL HIGH (ref 65–99)
POTASSIUM: 3.5 mmol/L (ref 3.5–5.1)
SODIUM: 137 mmol/L (ref 135–145)

## 2016-04-05 NOTE — ED Triage Notes (Signed)
Pt states that she started having CP three days ago along with a cough. CP is a burning, reports when she coughs that she tastes blood. Reports chills

## 2016-04-06 ENCOUNTER — Emergency Department (HOSPITAL_COMMUNITY)
Admission: EM | Admit: 2016-04-06 | Discharge: 2016-04-06 | Disposition: A | Discharge status: Home / Self Care | Payer: Medicare Other | Attending: Emergency Medicine | Admitting: Emergency Medicine

## 2016-04-06 DIAGNOSIS — R69 Illness, unspecified: Secondary | ICD-10-CM

## 2016-04-06 DIAGNOSIS — J111 Influenza due to unidentified influenza virus with other respiratory manifestations: Secondary | ICD-10-CM

## 2016-04-06 MED ORDER — GUAIFENESIN 100 MG/5ML PO LIQD: 100.0000 mg | ORAL | 0 refills | Status: DC | PRN

## 2016-04-06 MED ORDER — BENZONATATE 100 MG PO CAPS: 100.0000 mg | ORAL_CAPSULE | Freq: Three times a day (TID) | ORAL | 0 refills | Status: DC | PRN

## 2016-04-06 NOTE — ED Provider Notes (Signed)
[facility] DEPT Provider Note   CSN: [id number] Arrival date & time: 04/05/16  2018     History   Chief Complaint Chief Complaint  Patient presents with  . Chest Pain    HPI Melanie [name] is a 41 y.o. female.  HPI  41 year old female presents with cough, chest congestion, ear pain, sore throat, chest pain, and body aches. Has been ongoing starting on the morning of 1/23. No fevers but has had chills and sweats. Has taken Aleve without relief. Has been having right-sided chest pain and shortness of breath. Feels like her chest is very congested and cannot cough up anything. Has been having a frequent dry cough with occasional sputum, which is green. Has tasted some blood when coughing sometimes but has not seen any. Patient states that her son's grandfather works in a hospital and recently had similar symptoms and was diagnosed with the flu. She denies a current history of asthma, diabetes, immunosuppression, or cancer. She had a meningioma removed in 2011 but has not had any recurrence.  Past Medical History:  Diagnosis Date  . Abdominal pain   . Anxiety    pt denies - noted in several MD/hosp notes   . Asthma    hx of as teenager; induced with running now   . Blood in urine   . Brain tumor ([place]) 2011  . Chills   . Clotting disorder ([place])   . Complication of anesthesia    pt states is severe   . Confusion   . Depression    noted in several past MD notes/hosp notes pt to have major depressive disorder - pt denies   . Embolism - blood clot in left ovary  . Gallstones   . Headache    rarely has but currently has one this am   . Meningioma ([place]) 08/14/2011   S/p craniotomy Encephaloma present non changing on MRI in 08/2011  . Nausea   . Palpitations   . PONV (postoperative nausea and vomiting)   . Rectal bleeding   . Rectal pain   . Seizures ([place])    last one 2011  . TIA (transient ischemic attack)   . Unintentional weight loss   . Visual disturbance   .  Weakness     Patient Active Problem List   Diagnosis Date Noted  . S/P laparoscopic surgery 08/24/2014  . Endometriosis of ovary 08/24/2014  . Endometriosis   . Syncope 06/25/2014  . TIA (transient ischemic attack) 08/27/2013  . Pre-syncope 01/30/2012  . Major depressive disorder 11/05/2011  . Dizziness 08/15/2011  . Insomnia 08/15/2011  . Meningioma ([place]) 08/14/2011    Past Surgical History:  Procedure Laterality Date  . BRAIN SURGERY    . brain tumor removed  2011  . CHOLECYSTECTOMY  12/03/2011   Procedure: LAPAROSCOPIC CHOLECYSTECTOMY WITH INTRAOPERATIVE CHOLANGIOGRAM;  Surgeon: Madilyn Hook, DO;  Location: [facility];  Service: General;  Laterality: N/A;  . LAPAROSCOPIC OVARIAN CYSTECTOMY Bilateral 08/24/2014   Procedure: XI ROBOTIC ASSISTED BILATERAL LAPAROSCOPIC OVARIAN CYSTECTOMY RESECTION OF ENDOMETRIAL CYST;  Surgeon: Everitt Amber, MD;  Location: [facility];  Service: Gynecology;  Laterality: Bilateral;  . ovarian mass removed  2010   left    OB History    No data available       Home Medications    Prior to Admission medications   Medication Sig Start Date End Date Taking? Authorizing Provider  aspirin 81 MG chewable tablet Chew 1 tablet (81 mg total) by mouth daily. 08/28/13  Patrecia Pour, MD  benzonatate (TESSALON) 100 MG capsule Take 1 capsule (100 mg total) by mouth 3 (three) times daily as needed for cough. 04/06/16   Sherwood Gambler, MD  guaiFENesin (ROBITUSSIN) 100 MG/5ML liquid Take 5-10 mLs (100-200 mg total) by mouth every 4 (four) hours as needed for cough. 04/06/16   Sherwood Gambler, MD  ibuprofen (ADVIL,MOTRIN) 400 MG tablet Take 1 tablet (400 mg total) by mouth every 8 (eight) hours as needed for headache or moderate pain. 08/25/14   Everitt Amber, MD  meclizine (ANTIVERT) 25 MG tablet Take 1 tablet (25 mg total) by mouth 3 (three) times daily as needed for dizziness. 08/25/14   Everitt Amber, MD  Multiple Vitamin (MULTIVITAMIN WITH MINERALS) TABS tablet Take 1 tablet by  mouth every morning. 08/25/14   Everitt Amber, MD  ondansetron (ZOFRAN ODT) 4 MG disintegrating tablet 4mg  ODT q6 hours prn nausea/vomiting 06/28/15   Sherwood Gambler, MD    Family History Family History  Problem Relation Age of Onset  . Depression Mother   . Heart failure Father   . Hypertension Father   . Heart disease Father   . Alcohol abuse Sister   . Alcohol abuse Brother   . Alzheimer's disease Maternal Grandmother   . Diabetes Maternal Grandmother   . Stroke Maternal Grandmother     Social History Social History  Substance Use Topics  . Smoking status: Never Smoker  . Smokeless tobacco: Never Used  . Alcohol use No     Allergies   Morphine and related   Review of Systems Review of Systems  Constitutional: Positive for chills and diaphoresis. Negative for fever.  HENT: Positive for congestion, ear pain and sore throat.   Respiratory: Positive for cough and shortness of breath.   Cardiovascular: Positive for chest pain.  Gastrointestinal: Negative for abdominal pain, diarrhea, nausea and vomiting.  Musculoskeletal: Positive for myalgias.  Neurological: Positive for headaches.  All other systems reviewed and are negative.    Physical Exam Updated Vital Signs BP 117/74   Pulse 87   Temp 98.8 F (37.1 C) (Oral)   Resp 18   Wt 179 lb (81.2 kg)   LMP 03/05/2016 (Approximate)   SpO2 98%   BMI 33.82 kg/m   Physical Exam  Constitutional: She is oriented to person, place, and time. She appears well-developed and well-nourished.  HENT:  Head: Normocephalic and atraumatic.  Right Ear: Tympanic membrane and external ear normal.  Left Ear: Tympanic membrane and external ear normal.  Nose: Nose normal.  Mouth/Throat: Oropharynx is clear and moist. No oropharyngeal exudate.  Eyes: Right eye exhibits no discharge. Left eye exhibits no discharge.  Cardiovascular: Normal rate, regular rhythm and normal heart sounds.   Pulmonary/Chest: Effort normal and breath sounds  normal. She exhibits tenderness (right anterior chest).  Abdominal: Soft. She exhibits no distension. There is no tenderness.  Neurological: She is alert and oriented to person, place, and time.  Skin: Skin is warm and dry. She is not diaphoretic.  Nursing note and vitals reviewed.    ED Treatments / Results  Labs (all labs ordered are listed, but only abnormal results are displayed) Labs Reviewed  BASIC METABOLIC PANEL - Abnormal; Notable for the following:       Result Value   Glucose, Bld 127 (*)    All other components within normal limits  CBC  I-STAT TROPOININ, ED    EKG  EKG Interpretation  Date/Time:  Thursday April 05 2016 20:46:32 EST Ventricular Rate:  97 PR Interval:  152 QRS Duration: 76 QT Interval:  340 QTC Calculation: 431 R Axis:   -56 Text Interpretation:  Normal sinus rhythm Left axis deviation Cannot rule out Anterior infarct , age undetermined Abnormal ECG Low voltage QRS When compared with ECG of 06/25/2014, No significant change was found Reconfirmed by [facility]  MD, DAVID ([id number]) on 04/05/2016 11:52:48 PM       Radiology Dg Chest 2 View  Result Date: 04/05/2016 CLINICAL DATA:  Flu like symptoms for 3 days. EXAM: CHEST  2 VIEW COMPARISON:  10/12/2011 FINDINGS: The heart size and mediastinal contours are within normal limits. Both lungs are clear. The visualized skeletal structures are unremarkable. IMPRESSION: No active cardiopulmonary disease. Electronically Signed   By: Misty Stanley M.D.   On: 04/05/2016 21:25    Procedures Procedures (including critical care time)  Medications Ordered in ED Medications - No data to display   Initial Impression / Assessment and Plan / ED Course  I have reviewed the triage vital signs and the nursing notes.  Pertinent labs & imaging results that were available during my care of the patient were reviewed by me and considered in my medical decision making (see chart for details).     Patient's symptoms are  c/w influenza. No high risk features such as immunocompromise, DM, asthma. Her CP appears to be MSK, and with 3 days of constant symptoms and negative ECG and troponin I think ACS is quite unlikely. Doubt PE or dissection. No tachycardia or hypoxia. Symptoms started over 48 hours ago. At this time I don't think tamiflu is indicated. Recommended tylenol/nsaids and will prescribe cough suppressants. Discussed using plenty of fluids and discussed return precautions. No vomiting. Overall appears well, no signs of significant dehydration.   Final Clinical Impressions(s) / ED Diagnoses   Final diagnoses:  Influenza-like illness    New Prescriptions New Prescriptions   BENZONATATE (TESSALON) 100 MG CAPSULE    Take 1 capsule (100 mg total) by mouth 3 (three) times daily as needed for cough.   GUAIFENESIN (ROBITUSSIN) 100 MG/5ML LIQUID    Take 5-10 mLs (100-200 mg total) by mouth every 4 (four) hours as needed for cough.     Sherwood Gambler, MD 04/06/16 [phone]

## 2016-10-08 DIAGNOSIS — Z6833 Body mass index (BMI) 33.0-33.9, adult: Secondary | ICD-10-CM | POA: Diagnosis not present

## 2016-10-08 DIAGNOSIS — Z124 Encounter for screening for malignant neoplasm of cervix: Secondary | ICD-10-CM | POA: Diagnosis not present

## 2016-10-30 DIAGNOSIS — N809 Endometriosis, unspecified: Secondary | ICD-10-CM | POA: Diagnosis not present

## 2016-10-30 DIAGNOSIS — R1904 Left lower quadrant abdominal swelling, mass and lump: Secondary | ICD-10-CM | POA: Diagnosis not present

## 2016-11-02 ENCOUNTER — Telehealth: Payer: Self-pay | Admitting: *Deleted

## 2016-11-02 NOTE — Telephone Encounter (Signed)
Contacted Dr. Sherlynn Stalls office regarding new patient referral. Spoke with Santiago Glad and gave date/time of September 7th at 10:15am, arrive at 9:45am

## 2016-11-16 ENCOUNTER — Ambulatory Visit: Payer: Medicare Other | Admitting: Gynecologic Oncology

## 2017-09-28 ENCOUNTER — Emergency Department (HOSPITAL_COMMUNITY)
Admission: EM | Admit: 2017-09-28 | Discharge: 2017-09-28 | Disposition: A | Discharge status: Home / Self Care | Payer: Medicare Other | Attending: Emergency Medicine | Admitting: Emergency Medicine

## 2017-09-28 ENCOUNTER — Encounter (HOSPITAL_COMMUNITY): Payer: Self-pay | Admitting: Emergency Medicine

## 2017-09-28 DIAGNOSIS — L539 Erythematous condition, unspecified: Secondary | ICD-10-CM | POA: Diagnosis not present

## 2017-09-28 DIAGNOSIS — Z7982 Long term (current) use of aspirin: Secondary | ICD-10-CM | POA: Diagnosis not present

## 2017-09-28 DIAGNOSIS — L299 Pruritus, unspecified: Secondary | ICD-10-CM | POA: Diagnosis not present

## 2017-09-28 DIAGNOSIS — R21 Rash and other nonspecific skin eruption: Secondary | ICD-10-CM | POA: Diagnosis not present

## 2017-09-28 DIAGNOSIS — B86 Scabies: Secondary | ICD-10-CM | POA: Diagnosis not present

## 2017-09-28 DIAGNOSIS — J45909 Unspecified asthma, uncomplicated: Secondary | ICD-10-CM | POA: Insufficient documentation

## 2017-09-28 DIAGNOSIS — Z79899 Other long term (current) drug therapy: Secondary | ICD-10-CM | POA: Insufficient documentation

## 2017-09-28 MED ORDER — IVERMECTIN 3 MG PO TABS
200.0000 ug/kg | ORAL_TABLET | Freq: Once | ORAL | Status: AC
  Administered 2017-09-28: 15000 ug via ORAL
  Filled 2017-09-28: qty 5

## 2017-09-28 MED ORDER — IVERMECTIN 3 MG PO TABS: 200.0000 ug/kg | ORAL_TABLET | Freq: Once | ORAL | 0 refills | Status: DC

## 2017-09-28 MED ORDER — IVERMECTIN 3 MG PO TABS: 200.0000 ug/kg | ORAL_TABLET | Freq: Once | ORAL | 0 refills | Status: AC

## 2017-09-28 NOTE — Discharge Instructions (Addendum)
You were treated for scabies in the ER today.  Please take ivermectin 2 weeks, I have written you prescription and given your coupon for this.  Use RID spray in the car and on sofa (can buy this at drug store.)  Clean all sheets and bedding with hot water.  Return to the ER for any new or worsening like fever, worsening rash, pain with rash.

## 2017-09-28 NOTE — ED Triage Notes (Signed)
Patient complains of itching rashes over her entire body for the last several days. Reports she was recently was dog sitting in a home where the family had a multiple issues with scabies recently. Patient states she had seen a "hair-thin slow moving insect" crawling across her hands while in the home. Patient alert, oriented, and in no apparent distress at this time.

## 2017-09-28 NOTE — ED Provider Notes (Signed)
[facility] EMERGENCY DEPARTMENT Provider Note   CSN: [id number] Arrival date & time: 09/28/17  1141     History   Chief Complaint No chief complaint on file.   HPI Melanie [name] is a 42 y.o. female.  HPI   Melanie [name] is a 42yo female with a history of anxiety and depression who presents to the emergency department for evaluation itchy rash.  Patient reports that she was recently dog sitting at a house which known scabies infestation.  She reports that she developed a red bumpy, itchy rash all over her body two days ago.  She reports that the itching is worse at night keeps her from sleeping.  The rash is not painful.  States that she noticed a very small sized black bug on her hand earlier today.  She denies fevers, chills, trouble breathing, facial swelling, throat closing, drainage from the skin.   Past Medical History:  Diagnosis Date  . Abdominal pain   . Anxiety    pt denies - noted in several MD/hosp notes   . Asthma    hx of as teenager; induced with running now   . Blood in urine   . Brain tumor ([place]) 2011  . Chills   . Clotting disorder ([place])   . Complication of anesthesia    pt states is severe   . Confusion   . Depression    noted in several past MD notes/hosp notes pt to have major depressive disorder - pt denies   . Embolism - blood clot in left ovary  . Gallstones   . Headache    rarely has but currently has one this am   . Meningioma ([place]) 08/14/2011   S/p craniotomy Encephaloma present non changing on MRI in 08/2011  . Nausea   . Palpitations   . PONV (postoperative nausea and vomiting)   . Rectal bleeding   . Rectal pain   . Seizures ([place])    last one 2011  . TIA (transient ischemic attack)   . Unintentional weight loss   . Visual disturbance   . Weakness     Patient Active Problem List   Diagnosis Date Noted  . S/P laparoscopic surgery 08/24/2014  . Endometriosis of ovary 08/24/2014  . Endometriosis   . Syncope  06/25/2014  . TIA (transient ischemic attack) 08/27/2013  . Pre-syncope 01/30/2012  . Major depressive disorder 11/05/2011  . Dizziness 08/15/2011  . Insomnia 08/15/2011  . Meningioma ([place]) 08/14/2011    Past Surgical History:  Procedure Laterality Date  . BRAIN SURGERY    . brain tumor removed  2011  . CHOLECYSTECTOMY  12/03/2011   Procedure: LAPAROSCOPIC CHOLECYSTECTOMY WITH INTRAOPERATIVE CHOLANGIOGRAM;  Surgeon: Madilyn Hook, DO;  Location: [facility];  Service: General;  Laterality: N/A;  . LAPAROSCOPIC OVARIAN CYSTECTOMY Bilateral 08/24/2014   Procedure: XI ROBOTIC ASSISTED BILATERAL LAPAROSCOPIC OVARIAN CYSTECTOMY RESECTION OF ENDOMETRIAL CYST;  Surgeon: Everitt Amber, MD;  Location: [facility];  Service: Gynecology;  Laterality: Bilateral;  . ovarian mass removed  2010   left     OB History   None      Home Medications    Prior to Admission medications   Medication Sig Start Date End Date Taking? Authorizing Provider  aspirin 81 MG chewable tablet Chew 1 tablet (81 mg total) by mouth daily. 08/28/13   Patrecia Pour, MD  benzonatate (TESSALON) 100 MG capsule Take 1 capsule (100 mg total) by mouth 3 (three) times daily as needed  for cough. 04/06/16   Sherwood Gambler, MD  guaiFENesin (ROBITUSSIN) 100 MG/5ML liquid Take 5-10 mLs (100-200 mg total) by mouth every 4 (four) hours as needed for cough. 04/06/16   Sherwood Gambler, MD  ibuprofen (ADVIL,MOTRIN) 400 MG tablet Take 1 tablet (400 mg total) by mouth every 8 (eight) hours as needed for headache or moderate pain. 08/25/14   Everitt Amber, MD  meclizine (ANTIVERT) 25 MG tablet Take 1 tablet (25 mg total) by mouth 3 (three) times daily as needed for dizziness. 08/25/14   Everitt Amber, MD  Multiple Vitamin (MULTIVITAMIN WITH MINERALS) TABS tablet Take 1 tablet by mouth every morning. 08/25/14   Everitt Amber, MD  ondansetron (ZOFRAN ODT) 4 MG disintegrating tablet 4mg  ODT q6 hours prn nausea/vomiting 06/28/15   Sherwood Gambler, MD    Family  History Family History  Problem Relation Age of Onset  . Depression Mother   . Heart failure Father   . Hypertension Father   . Heart disease Father   . Alcohol abuse Sister   . Alcohol abuse Brother   . Alzheimer's disease Maternal Grandmother   . Diabetes Maternal Grandmother   . Stroke Maternal Grandmother     Social History Social History   Tobacco Use  . Smoking status: Never Smoker  . Smokeless tobacco: Never Used  Substance Use Topics  . Alcohol use: No  . Drug use: No     Allergies   Morphine and related   Review of Systems Review of Systems  Constitutional: Negative for chills and fever.  HENT: Negative for facial swelling.   Respiratory: Negative for shortness of breath.   Gastrointestinal: Negative for abdominal pain and vomiting.  Musculoskeletal: Negative for arthralgias.  Skin: Positive for rash.  Neurological: Negative for headaches.     Physical Exam Updated Vital Signs BP 130/87 (BP Location: Left Arm)   Pulse 97   Temp 98.6 F (37 C) (Oral)   Resp 16   Wt 77.7 kg (171 lb 3 oz)   SpO2 98%   BMI 32.35 kg/m   Physical Exam  Constitutional: She is oriented to person, place, and time. She appears well-developed and well-nourished. No distress.  NAD  HENT:  Head: Normocephalic and atraumatic.  Eyes: Right eye exhibits no discharge. Left eye exhibits no discharge.  Pulmonary/Chest: Effort normal. No respiratory distress.  Neurological: She is alert and oriented to person, place, and time. Coordination normal.  Skin: Skin is warm and dry. She is not diaphoretic.  Small erythematous papules noted on bilateral arms/wrists, lower legs/ankles, under the breast tissue and over the belt line. No involvement of the face. No drainage, warmth or induration. No overlying tenderness. No bullae.   Psychiatric: She has a normal mood and affect. Her behavior is normal.  Nursing note and vitals reviewed.    ED Treatments / Results  Labs (all labs  ordered are listed, but only abnormal results are displayed) Labs Reviewed - No data to display  EKG None  Radiology No results found.  Procedures Procedures (including critical care time)  Medications Ordered in ED Medications  ivermectin (STROMECTOL) tablet 15,000 mcg (has no administration in time range)     Initial Impression / Assessment and Plan / ED Course  I have reviewed the triage vital signs and the nursing notes.  Pertinent labs & imaging results that were available during my care of the patient were reviewed by me and considered in my medical decision making (see chart for details).  Discussed diagnosis & treatment of scabies with patient.  She was treated with oral ivermectin in the ED and has been given prescription for repeat in 2 weeks time.  She has also been advised to clean entire household including washing sheets and using R.I.D. spray in the car and on sofa.  Counseled her on return precautions and she agrees and voices understanding.  Final Clinical Impressions(s) / ED Diagnoses   Final diagnoses:  Scabies    ED Discharge Orders        Ordered    ivermectin (STROMECTOL) 3 MG TABS tablet   Once     09/28/17 1329       Bernarda Caffey 09/28/17 1330    Isla Pence, MD 09/28/17 1450

## 2018-09-14 ENCOUNTER — Other Ambulatory Visit: Payer: Self-pay

## 2018-09-14 ENCOUNTER — Encounter (HOSPITAL_COMMUNITY): Payer: Self-pay | Admitting: Emergency Medicine

## 2018-09-14 ENCOUNTER — Emergency Department (HOSPITAL_COMMUNITY)
Admission: EM | Admit: 2018-09-14 | Discharge: 2018-09-14 | Disposition: A | Discharge status: Home / Self Care | Payer: Medicare Other | Attending: Emergency Medicine | Admitting: Emergency Medicine

## 2018-09-14 ENCOUNTER — Emergency Department (HOSPITAL_COMMUNITY): Payer: Medicare Other

## 2018-09-14 DIAGNOSIS — Z7982 Long term (current) use of aspirin: Secondary | ICD-10-CM | POA: Insufficient documentation

## 2018-09-14 DIAGNOSIS — H539 Unspecified visual disturbance: Secondary | ICD-10-CM

## 2018-09-14 DIAGNOSIS — Z79899 Other long term (current) drug therapy: Secondary | ICD-10-CM | POA: Insufficient documentation

## 2018-09-14 DIAGNOSIS — Z8673 Personal history of transient ischemic attack (TIA), and cerebral infarction without residual deficits: Secondary | ICD-10-CM | POA: Diagnosis not present

## 2018-09-14 DIAGNOSIS — R42 Dizziness and giddiness: Secondary | ICD-10-CM | POA: Diagnosis not present

## 2018-09-14 DIAGNOSIS — H532 Diplopia: Secondary | ICD-10-CM | POA: Insufficient documentation

## 2018-09-14 LAB — COMPREHENSIVE METABOLIC PANEL
ALT: 10 U/L (ref 0–44)
AST: 14 U/L — ABNORMAL LOW (ref 15–41)
Albumin: 3.6 g/dL (ref 3.5–5.0)
Alkaline Phosphatase: 40 U/L (ref 38–126)
Anion gap: 8 (ref 5–15)
BUN: 7 mg/dL (ref 6–20)
CO2: 22 mmol/L (ref 22–32)
Calcium: 8.7 mg/dL — ABNORMAL LOW (ref 8.9–10.3)
Chloride: 108 mmol/L (ref 98–111)
Creatinine, Ser: 0.71 mg/dL (ref 0.44–1.00)
GFR calc Af Amer: >60 mL/min (ref >60)
GFR calc non Af Amer: >60 mL/min (ref >60)
Glucose, Bld: 101 mg/dL — ABNORMAL HIGH (ref 70–99)
Potassium: 3.8 mmol/L (ref 3.5–5.1)
Sodium: 138 mmol/L (ref 135–145)
Total Bilirubin: 0.7 mg/dL (ref 0.3–1.2)
Total Protein: 6.7 g/dL (ref 6.5–8.1)

## 2018-09-14 LAB — CBC WITH DIFFERENTIAL/PLATELET
Abs Immature Granulocytes: 0.03 10*3/uL (ref 0.00–0.07)
Basophils Absolute: 0 10*3/uL (ref 0.0–0.1)
Basophils Relative: 0 %
Eosinophils Absolute: 0.1 10*3/uL (ref 0.0–0.5)
Eosinophils Relative: 1 %
HCT: 42.8 % (ref 36.0–46.0)
Hemoglobin: 14.3 g/dL (ref 12.0–15.0)
Immature Granulocytes: 0 %
Lymphocytes Relative: 22 %
Lymphs Abs: 1.5 10*3/uL (ref 0.7–4.0)
MCH: 29.4 pg (ref 26.0–34.0)
MCHC: 33.4 g/dL (ref 30.0–36.0)
MCV: 88.1 fL (ref 80.0–100.0)
Monocytes Absolute: 0.4 10*3/uL (ref 0.1–1.0)
Monocytes Relative: 6 %
Neutro Abs: 4.7 10*3/uL (ref 1.7–7.7)
Neutrophils Relative %: 71 %
Platelets: 271 10*3/uL (ref 150–400)
RBC: 4.86 MIL/uL (ref 3.87–5.11)
RDW: 13.2 % (ref 11.5–15.5)
WBC: 6.7 10*3/uL (ref 4.0–10.5)
nRBC: 0 % (ref 0.0–0.2)

## 2018-09-14 LAB — URINALYSIS, ROUTINE W REFLEX MICROSCOPIC
Bilirubin Urine: NEGATIVE
Glucose, UA: NEGATIVE mg/dL
Hgb urine dipstick: NEGATIVE
Ketones, ur: 5 mg/dL — AB
Leukocytes,Ua: NEGATIVE
Nitrite: NEGATIVE
Protein, ur: NEGATIVE mg/dL
Specific Gravity, Urine: 1.013 (ref 1.005–1.030)
pH: 7 (ref 5.0–8.0)

## 2018-09-14 LAB — TSH: TSH: 0.66 u[IU]/mL (ref 0.350–4.500)

## 2018-09-14 LAB — PROTIME-INR
INR: 1 (ref 0.8–1.2)
Prothrombin Time: 12.9 seconds (ref 11.4–15.2)

## 2018-09-14 MED ORDER — MECLIZINE HCL 25 MG PO TABS: 25.0000 mg | ORAL_TABLET | Freq: Three times a day (TID) | ORAL | 0 refills | Status: DC | PRN

## 2018-09-14 MED ORDER — LORAZEPAM 2 MG/ML IJ SOLN
0.5000 mg | Freq: Once | INTRAMUSCULAR | Status: AC
  Administered 2018-09-14: 0.5 mg via INTRAVENOUS
  Filled 2018-09-14: qty 1

## 2018-09-14 MED ORDER — IOHEXOL 350 MG/ML SOLN: 50.0000 mL | Freq: Once | INTRAVENOUS | Status: DC | PRN

## 2018-09-14 MED ORDER — GADOBUTROL 1 MMOL/ML IV SOLN
6.8000 mL | Freq: Once | INTRAVENOUS | Status: AC | PRN
  Administered 2018-09-14: 6.8 mL via INTRAVENOUS

## 2018-09-14 MED ORDER — IOHEXOL 350 MG/ML SOLN
75.0000 mL | Freq: Once | INTRAVENOUS | Status: AC | PRN
  Administered 2018-09-14: 75 mL via INTRAVENOUS

## 2018-09-14 NOTE — ED Triage Notes (Signed)
Pt. Stated, Ive had fullness in my ear started this morning when I woke up. Ive had dizziness and some numbness in my back in the center.  I don't go to Dr. Cause I don't need one.

## 2018-09-14 NOTE — Discharge Instructions (Signed)
Your imaging today was overall reassuring with no evidence of recurrent brain tumor, bleed, dissection, or other intracranial abnormality.  We suspect you have vertigo as a cause of your symptoms.  Please use the meclizine to help with the dizziness and stay hydrated.  We found you were slightly dehydrated as well.  If any symptoms change or worsen, please return to the nearest emergency department.  Please follow-up with your PCP.

## 2018-09-14 NOTE — ED Provider Notes (Signed)
[facility] EMERGENCY DEPARTMENT Provider Note   CSN: [id number] Arrival date & time: 09/14/18  9242     History   Chief Complaint Chief Complaint  Patient presents with   Numbness   Dizziness   Otalgia    HPI Melanie [name] is a 43 y.o. female.     The history is provided by the patient and medical records. No language interpreter was used.  Neurologic Problem This is a recurrent problem. The current episode started more than 2 days ago. The problem occurs daily. The problem has been resolved. Pertinent negatives include no chest pain, no abdominal pain, no headaches and no shortness of breath. Exacerbated by: laying down. Nothing relieves the symptoms. She has tried nothing for the symptoms. The treatment provided no relief.    Past Medical History:  Diagnosis Date   Abdominal pain    Anxiety    pt denies - noted in several MD/hosp notes    Asthma    hx of as teenager; induced with running now    Blood in urine    Brain tumor ([facility]) 2011   Chills    Clotting disorder ([place])    Complication of anesthesia    pt states is severe    Confusion    Depression    noted in several past MD notes/hosp notes pt to have major depressive disorder - pt denies    Embolism - blood clot in left ovary   Gallstones    Headache    rarely has but currently has one this am    Meningioma ([place]) 08/14/2011   S/p craniotomy Encephaloma present non changing on MRI in 08/2011   Nausea    Palpitations    PONV (postoperative nausea and vomiting)    Rectal bleeding    Rectal pain    Seizures ([place])    last one 2011   TIA (transient ischemic attack)    Unintentional weight loss    Visual disturbance    Weakness     Patient Active Problem List   Diagnosis Date Noted   S/P laparoscopic surgery 08/24/2014   Endometriosis of ovary 08/24/2014   Endometriosis    Syncope 06/25/2014   TIA (transient ischemic attack) 08/27/2013    Pre-syncope 01/30/2012   Major depressive disorder 11/05/2011   Dizziness 08/15/2011   Insomnia 08/15/2011   Meningioma ([place]) 08/14/2011    Past Surgical History:  Procedure Laterality Date   BRAIN SURGERY     brain tumor removed  2011   CHOLECYSTECTOMY  12/03/2011   Procedure: LAPAROSCOPIC CHOLECYSTECTOMY WITH INTRAOPERATIVE CHOLANGIOGRAM;  Surgeon: Madilyn Hook, DO;  Location: [facility];  Service: General;  Laterality: N/A;   LAPAROSCOPIC OVARIAN CYSTECTOMY Bilateral 08/24/2014   Procedure: XI ROBOTIC ASSISTED BILATERAL LAPAROSCOPIC OVARIAN CYSTECTOMY RESECTION OF ENDOMETRIAL CYST;  Surgeon: Everitt Amber, MD;  Location: [facility];  Service: Gynecology;  Laterality: Bilateral;   ovarian mass removed  2010   left     OB History   No obstetric history on file.      Home Medications    Prior to Admission medications   Medication Sig Start Date End Date Taking? Authorizing Provider  aspirin 81 MG chewable tablet Chew 1 tablet (81 mg total) by mouth daily. 08/28/13   Patrecia Pour, MD  benzonatate (TESSALON) 100 MG capsule Take 1 capsule (100 mg total) by mouth 3 (three) times daily as needed for cough. 04/06/16   Sherwood Gambler, MD  guaiFENesin (ROBITUSSIN) 100 MG/5ML liquid Take  5-10 mLs (100-200 mg total) by mouth every 4 (four) hours as needed for cough. 04/06/16   Sherwood Gambler, MD  ibuprofen (ADVIL,MOTRIN) 400 MG tablet Take 1 tablet (400 mg total) by mouth every 8 (eight) hours as needed for headache or moderate pain. 08/25/14   Everitt Amber, MD  meclizine (ANTIVERT) 25 MG tablet Take 1 tablet (25 mg total) by mouth 3 (three) times daily as needed for dizziness. 08/25/14   Everitt Amber, MD  Multiple Vitamin (MULTIVITAMIN WITH MINERALS) TABS tablet Take 1 tablet by mouth every morning. 08/25/14   Everitt Amber, MD  ondansetron (ZOFRAN ODT) 4 MG disintegrating tablet 4mg  ODT q6 hours prn nausea/vomiting 06/28/15   Sherwood Gambler, MD    Family History Family History  Problem Relation  Age of Onset   Depression Mother    Heart failure Father    Hypertension Father    Heart disease Father    Alcohol abuse Sister    Alcohol abuse Brother    Alzheimer's disease Maternal Grandmother    Diabetes Maternal Grandmother    Stroke Maternal Grandmother     Social History Social History   Tobacco Use   Smoking status: Never Smoker   Smokeless tobacco: Never Used  Substance Use Topics   Alcohol use: No   Drug use: No     Allergies   Morphine and related   Review of Systems Review of Systems  Constitutional: Negative for chills, diaphoresis, fatigue and fever.  Eyes: Positive for visual disturbance. Negative for photophobia.  Respiratory: Negative for cough, chest tightness, shortness of breath, wheezing and stridor.   Cardiovascular: Negative for chest pain and palpitations.  Gastrointestinal: Negative for abdominal pain, constipation, diarrhea, nausea and vomiting.  Genitourinary: Negative for dysuria and flank pain.  Musculoskeletal: Negative for back pain, neck pain and neck stiffness.  Skin: Negative for rash and wound.  Neurological: Positive for dizziness. Negative for seizures, syncope, speech difficulty, weakness, light-headedness, numbness and headaches.  Psychiatric/Behavioral: Negative for agitation.  All other systems reviewed and are negative.    Physical Exam Updated Vital Signs BP 115/67 (BP Location: Left Arm)    Pulse 75    Temp 98.8 F (37.1 C)    Resp 17    Ht 5\' 2"  (1.575 m)    Wt 68 kg    LMP 09/13/2018    SpO2 100%    BMI 27.44 kg/m   Physical Exam Vitals signs and nursing note reviewed.  Constitutional:      General: She is not in acute distress.    Appearance: She is well-developed. She is not ill-appearing, toxic-appearing or diaphoretic.  HENT:     Head: Normocephalic and atraumatic.     Right Ear: External ear normal.     Left Ear: External ear normal.     Nose: Nose normal. No congestion or rhinorrhea.      Mouth/Throat:     Mouth: Mucous membranes are moist.     Pharynx: No oropharyngeal exudate.  Eyes:     Conjunctiva/sclera: Conjunctivae normal.     Pupils: Pupils are equal, round, and reactive to light.  Neck:     Musculoskeletal: Normal range of motion and neck supple. No muscular tenderness.  Cardiovascular:     Rate and Rhythm: Normal rate.     Pulses: Normal pulses.     Heart sounds: No murmur.  Pulmonary:     Effort: No respiratory distress.     Breath sounds: No stridor. No wheezing, rhonchi or rales.  Chest:     Chest wall: No tenderness.  Abdominal:     General: Abdomen is flat. There is no distension.     Tenderness: There is no abdominal tenderness. There is no rebound.  Musculoskeletal:        General: No tenderness.  Skin:    General: Skin is warm.     Capillary Refill: Capillary refill takes less than 2 seconds.     Findings: No erythema or rash.  Neurological:     General: No focal deficit present.     Mental Status: She is alert and oriented to person, place, and time.     Cranial Nerves: No cranial nerve deficit.     Sensory: No sensory deficit.     Motor: No weakness or abnormal muscle tone.     Coordination: Coordination normal.     Gait: Gait normal.     Deep Tendon Reflexes: Reflexes are normal and symmetric. Reflexes normal.  Psychiatric:        Mood and Affect: Mood normal.      ED Treatments / Results  Labs (all labs ordered are listed, but only abnormal results are displayed) Labs Reviewed  COMPREHENSIVE METABOLIC PANEL - Abnormal; Notable for the following components:      Result Value   Glucose, Bld 101 (*)    Calcium 8.7 (*)    AST 14 (*)    All other components within normal limits  URINALYSIS, ROUTINE W REFLEX MICROSCOPIC - Abnormal; Notable for the following components:   Ketones, ur 5 (*)    All other components within normal limits  CBC WITH DIFFERENTIAL/PLATELET  TSH  PROTIME-INR  POC URINE PREG, ED    EKG EKG  Interpretation  Date/Time:  Sunday September 14 2018 11:40:18 EDT Ventricular Rate:  60 PR Interval:    QRS Duration: 79 QT Interval:  403 QTC Calculation: 403 R Axis:   3 Text Interpretation:  Sinus rhythm Anteroseptal infarct, age indeterminate When compared to prior,  slower rate.  No STEMI Confirmed by Antony Blackbird [phone]) on 09/14/2018 11:55:04 AM   Radiology Ct Angio Head W Or Wo Contrast  Result Date: 09/14/2018 CLINICAL DATA:  History left meningioma removal. Dizziness and diplopia today. EXAM: CT ANGIOGRAPHY HEAD AND NECK TECHNIQUE: Multidetector CT imaging of the head and neck was performed using the standard protocol during bolus administration of intravenous contrast. Multiplanar CT image reconstructions and MIPs were obtained to evaluate the vascular anatomy. Carotid stenosis measurements (when applicable) are obtained utilizing NASCET criteria, using the distal internal carotid diameter as the denominator. CONTRAST:  65mL OMNIPAQUE IOHEXOL 350 MG/ML SOLN COMPARISON:  MRI same day. Multiple prior studies as distant as 2011. FINDINGS: CT HEAD FINDINGS Brain: No sign of acute infarction. Mild left posterior frontal superficial volume loss at the site of previous meningioma resection. Brain is otherwise normal. No mass, hemorrhage, hydrocephalus or extra-axial collection. No sign of residual or recurrent tumor. Vascular: No abnormal vascular finding. Skull: Left frontoparietal craniotomy without unexpected feature. Sinuses: Clear Orbits: Normal Review of the MIP images confirms the above findings CTA NECK FINDINGS Aortic arch: Normal. Left vertebral artery arises directly from the arch. Right carotid system: Common carotid artery widely patent to the bifurcation. The carotid bifurcation is normal without evidence of atherosclerotic disease. Cervical ICA is normal. Left carotid system: Common carotid artery widely patent to the bifurcation. The carotid bifurcation is normal without evidence of  atherosclerotic disease. Cervical ICA is normal. Vertebral arteries: Both vertebral arteries are widely patent  from their origins the foramen magnum. As noted above, left vertebral artery arises directly from the arch. Skeleton: Ordinary spondylosis C5-6. Other neck: No mass or adenopathy. Upper chest: Relative hyperlucency of the medial aspect of left upper lobe. This is not of any acute clinical relevance. This can be seen in cases of bronchial atresia with collateral drift. Review of the MIP images confirms the above findings CTA HEAD FINDINGS Anterior circulation: Both internal carotid arteries are widely patent through the skull base and siphon regions. No siphon stenosis. The anterior and middle cerebral vessels are normal without proximal stenosis, aneurysm or vascular malformation. Posterior circulation: Both vertebral arteries are widely patent through the foramen magnum to the basilar. No basilar stenosis. Posterior circulation branch vessels are normal. Venous sinuses: Patent and normal. Anatomic variants: None significant. Delayed phase: Not performed. Review of the MIP images confirms the above findings IMPRESSION: No vascular abnormality. No evidence of atherosclerotic disease. No stenosis or dissection. No large or medium vessel occlusion. Previous left frontoparietal craniotomy for meningioma resection. No evidence of residual or recurrent tumor. Electronically Signed   By: Nelson Chimes M.D.   On: 09/14/2018 13:53   Ct Angio Neck W And/or Wo Contrast  Result Date: 09/14/2018 CLINICAL DATA:  History left meningioma removal. Dizziness and diplopia today. EXAM: CT ANGIOGRAPHY HEAD AND NECK TECHNIQUE: Multidetector CT imaging of the head and neck was performed using the standard protocol during bolus administration of intravenous contrast. Multiplanar CT image reconstructions and MIPs were obtained to evaluate the vascular anatomy. Carotid stenosis measurements (when applicable) are obtained utilizing  NASCET criteria, using the distal internal carotid diameter as the denominator. CONTRAST:  54mL OMNIPAQUE IOHEXOL 350 MG/ML SOLN COMPARISON:  MRI same day. Multiple prior studies as distant as 2011. FINDINGS: CT HEAD FINDINGS Brain: No sign of acute infarction. Mild left posterior frontal superficial volume loss at the site of previous meningioma resection. Brain is otherwise normal. No mass, hemorrhage, hydrocephalus or extra-axial collection. No sign of residual or recurrent tumor. Vascular: No abnormal vascular finding. Skull: Left frontoparietal craniotomy without unexpected feature. Sinuses: Clear Orbits: Normal Review of the MIP images confirms the above findings CTA NECK FINDINGS Aortic arch: Normal. Left vertebral artery arises directly from the arch. Right carotid system: Common carotid artery widely patent to the bifurcation. The carotid bifurcation is normal without evidence of atherosclerotic disease. Cervical ICA is normal. Left carotid system: Common carotid artery widely patent to the bifurcation. The carotid bifurcation is normal without evidence of atherosclerotic disease. Cervical ICA is normal. Vertebral arteries: Both vertebral arteries are widely patent from their origins the foramen magnum. As noted above, left vertebral artery arises directly from the arch. Skeleton: Ordinary spondylosis C5-6. Other neck: No mass or adenopathy. Upper chest: Relative hyperlucency of the medial aspect of left upper lobe. This is not of any acute clinical relevance. This can be seen in cases of bronchial atresia with collateral drift. Review of the MIP images confirms the above findings CTA HEAD FINDINGS Anterior circulation: Both internal carotid arteries are widely patent through the skull base and siphon regions. No siphon stenosis. The anterior and middle cerebral vessels are normal without proximal stenosis, aneurysm or vascular malformation. Posterior circulation: Both vertebral arteries are widely patent  through the foramen magnum to the basilar. No basilar stenosis. Posterior circulation branch vessels are normal. Venous sinuses: Patent and normal. Anatomic variants: None significant. Delayed phase: Not performed. Review of the MIP images confirms the above findings IMPRESSION: No vascular abnormality. No evidence of  atherosclerotic disease. No stenosis or dissection. No large or medium vessel occlusion. Previous left frontoparietal craniotomy for meningioma resection. No evidence of residual or recurrent tumor. Electronically Signed   By: Nelson Chimes M.D.   On: 09/14/2018 13:53   Mr Brain W And Wo Contrast  Result Date: 09/14/2018 CLINICAL DATA:  Double vision. Dizziness. Retro-orbital pressure. History of benign brain tumor. EXAM: MRI HEAD WITHOUT AND WITH CONTRAST TECHNIQUE: Multiplanar, multiecho pulse sequences of the brain and surrounding structures were obtained without and with intravenous contrast. CONTRAST:  7 cc Gadavist COMPARISON:  08/27/2013 FINDINGS: Brain: Diffusion imaging does not show any acute or subacute infarction. Brainstem and cerebellum are normal. Right cerebral hemisphere is normal. There has been left frontoparietal craniotomy presumably for meningioma resection. Small amount of atrophy and gliosis in the left posterior frontal region. No evidence of residual or recurrent mass lesion, hemorrhage, hydrocephalus or extra-axial collection. No ischemic changes. Vascular: Major vessels at the base of the brain show flow. Venous sinuses appear patent. Skull and upper cervical spine: Otherwise negative Sinuses/Orbits: Clear/normal Other: None IMPRESSION: No acute finding. No cause of the presenting symptoms is identified. Previous left frontoparietal craniotomy, for meningioma resection. Small area of volume loss and gliosis in the lateral left posterior frontal lobe presumably subsequent to the previous treated lesion. No residual or recurrent mass. Electronically Signed   By: Nelson Chimes  M.D.   On: 09/14/2018 13:23    Procedures Procedures (including critical care time)  Medications Ordered in ED Medications  LORazepam (ATIVAN) injection 0.5 mg (0.5 mg Intravenous Given 09/14/18 1201)  gadobutrol (GADAVIST) 1 MMOL/ML injection 6.8 mL (6.8 mLs Intravenous Contrast Given 09/14/18 1315)  iohexol (OMNIPAQUE) 350 MG/ML injection 75 mL (75 mLs Intravenous Contrast Given 09/14/18 1327)     Initial Impression / Assessment and Plan / ED Course  I have reviewed the triage vital signs and the nursing notes.  Pertinent labs & imaging results that were available during my care of the patient were reviewed by me and considered in my medical decision making (see chart for details).        Melanie [name] is a 43 y.o. female with a past medical history significant for prior brain tumor status post removal, history of TIA, depression, and endometriosis who presents with recurrence of diplopia and dizziness.  Patient reports that she had a brain tumor years ago that was removed and reports this last week she has had similar symptoms.  She reports that when she lays back her room spinning dizziness is persistent and she reports on and off this week she has had vertical diplopia which she has not had since the brain tumor.  She reports some nausea but no vomiting.  She denies significant headache at this time.  She denies palpitations, shortness of breath, chest pain, abdominal pain or extremity symptoms.  She does report she had some numb tingling sensation in her central back and this comes and goes.  She reports she has some right ear fullness with decreased hearing on the right side.  Left-sided hearing is normal.  On exam, patient had a small amount of cerumen on the right but no evidence of abnormality on TM exam.  Neck was nontender with large motion.  No focal neurologic deficits with finger-nose-finger testing, gait, reflexes, or speech.  No facial droop.  Normal strength and sensation in  extremities.  Lungs clear chest nontender.  Exam otherwise unremarkable.  Clinically I am concerned that her reported symptoms are similar  to when she was diagnosed with her brain tumor and had TIAs.  She will have CTA head and neck to look for posterior circulation abnormality with the diplopia and vertiginous dizzy symptoms.  She will also have a MRI brain with and without to look for recurrence of brain tumor.  She will have screen laboratory testing.    CTA head and neck and MRI all reassuring with no evidence of stroke, dissection, brain tumor, or other abnormality.  Patient's workup also reassuring from lab standpoint.  Patient was informed of these findings and informed that her symptoms are likely related to a positional vertigo with the dizziness worsening when she is lying back and ear pressure.  Patient will be given prescription for meclizine and follow-up with her PCP which plan she agreed with.  She agreed with discharge home with outpatient follow-up and strict return precautions.  She was feeling better.  She was able to ambulate to the bathroom safely without difficulty.  Patient discharged in good condition after reassuring work-up.  Final Clinical Impressions(s) / ED Diagnoses   Final diagnoses:  Dizziness  Transient vision disturbance    ED Discharge Orders         Ordered    meclizine (ANTIVERT) 25 MG tablet  3 times daily PRN     09/14/18 1518         Clinical Impression: 1. Dizziness   2. Transient vision disturbance     Disposition: Discharge  Condition: Good  I have discussed the results, Dx and Tx plan with the pt(& family if present). He/she/they expressed understanding and agree(s) with the plan. Discharge instructions discussed at great length. Strict return precautions discussed and pt &/or family have verbalized understanding of the instructions. No further questions at time of discharge.    Discharge Medication List as of 09/14/2018  3:19 PM      START taking these medications   Details  !! meclizine (ANTIVERT) 25 MG tablet Take 1 tablet (25 mg total) by mouth 3 (three) times daily as needed for dizziness., Starting Sun 09/14/2018, Print     !! - Potential duplicate medications found. Please discuss with provider.      Follow Up: [place] [id number] [phone] Schedule an appointment as soon as possible for a visit    [facility] [address] [phone] [place]       Artice Holohan, Gwenyth Allegra, MD 09/14/18 [phone]

## 2018-09-14 NOTE — ED Triage Notes (Signed)
Pt. Stated, when I watch TV sometimes I see double vision, in the past week. Not all the time.

## 2018-09-14 NOTE — ED Notes (Signed)
ED Provider at bedside. 

## 2018-09-14 NOTE — ED Notes (Signed)
Doctor at bedside.

## 2018-09-14 NOTE — ED Notes (Signed)
Pt returned to room from CT

## 2018-09-14 NOTE — ED Notes (Signed)
Patient transported to MRI 

## 2018-12-12 ENCOUNTER — Emergency Department (HOSPITAL_COMMUNITY)
Admission: EM | Admit: 2018-12-12 | Discharge: 2018-12-13 | Disposition: A | Discharge status: Other Acute Inpatient Hospital | Payer: Medicare Other | Attending: Emergency Medicine | Admitting: Emergency Medicine

## 2018-12-12 ENCOUNTER — Encounter (HOSPITAL_COMMUNITY): Payer: Self-pay | Admitting: Emergency Medicine

## 2018-12-12 ENCOUNTER — Other Ambulatory Visit: Payer: Self-pay

## 2018-12-12 DIAGNOSIS — Z86011 Personal history of benign neoplasm of the brain: Secondary | ICD-10-CM | POA: Diagnosis not present

## 2018-12-12 DIAGNOSIS — R45851 Suicidal ideations: Secondary | ICD-10-CM | POA: Insufficient documentation

## 2018-12-12 DIAGNOSIS — T50902A Poisoning by unspecified drugs, medicaments and biological substances, intentional self-harm, initial encounter: Secondary | ICD-10-CM | POA: Diagnosis not present

## 2018-12-12 DIAGNOSIS — E876 Hypokalemia: Secondary | ICD-10-CM | POA: Insufficient documentation

## 2018-12-12 DIAGNOSIS — Z20828 Contact with and (suspected) exposure to other viral communicable diseases: Secondary | ICD-10-CM | POA: Diagnosis not present

## 2018-12-12 DIAGNOSIS — F329 Major depressive disorder, single episode, unspecified: Secondary | ICD-10-CM | POA: Diagnosis not present

## 2018-12-12 DIAGNOSIS — J45909 Unspecified asthma, uncomplicated: Secondary | ICD-10-CM | POA: Diagnosis not present

## 2018-12-12 DIAGNOSIS — Z046 Encounter for general psychiatric examination, requested by authority: Secondary | ICD-10-CM | POA: Insufficient documentation

## 2018-12-12 LAB — COMPREHENSIVE METABOLIC PANEL
ALT: 8 U/L (ref 0–44)
AST: 13 U/L — ABNORMAL LOW (ref 15–41)
Albumin: 4.5 g/dL (ref 3.5–5.0)
Alkaline Phosphatase: 54 U/L (ref 38–126)
Anion gap: 11 (ref 5–15)
BUN: 10 mg/dL (ref 6–20)
CO2: 21 mmol/L — ABNORMAL LOW (ref 22–32)
Calcium: 9.9 mg/dL (ref 8.9–10.3)
Chloride: 106 mmol/L (ref 98–111)
Creatinine, Ser: 0.7 mg/dL (ref 0.44–1.00)
GFR calc Af Amer: >60 mL/min (ref >60)
GFR calc non Af Amer: >60 mL/min (ref >60)
Glucose, Bld: 102 mg/dL — ABNORMAL HIGH (ref 70–99)
Potassium: 3.2 mmol/L — ABNORMAL LOW (ref 3.5–5.1)
Sodium: 138 mmol/L (ref 135–145)
Total Bilirubin: 0.3 mg/dL (ref 0.3–1.2)
Total Protein: 8.1 g/dL (ref 6.5–8.1)

## 2018-12-12 LAB — CBC WITH DIFFERENTIAL/PLATELET
Abs Immature Granulocytes: 0.03 10*3/uL (ref 0.00–0.07)
Basophils Absolute: 0.1 10*3/uL (ref 0.0–0.1)
Basophils Relative: 1 %
Eosinophils Absolute: 0 10*3/uL (ref 0.0–0.5)
Eosinophils Relative: 0 %
HCT: 45.9 % (ref 36.0–46.0)
Hemoglobin: 15.3 g/dL — ABNORMAL HIGH (ref 12.0–15.0)
Immature Granulocytes: 0 %
Lymphocytes Relative: 13 %
Lymphs Abs: 1.2 10*3/uL (ref 0.7–4.0)
MCH: 29.1 pg (ref 26.0–34.0)
MCHC: 33.3 g/dL (ref 30.0–36.0)
MCV: 87.4 fL (ref 80.0–100.0)
Monocytes Absolute: 0.5 10*3/uL (ref 0.1–1.0)
Monocytes Relative: 5 %
Neutro Abs: 7.5 10*3/uL (ref 1.7–7.7)
Neutrophils Relative %: 81 %
Platelets: 316 10*3/uL (ref 150–400)
RBC: 5.25 MIL/uL — ABNORMAL HIGH (ref 3.87–5.11)
RDW: 12.9 % (ref 11.5–15.5)
WBC: 9.3 10*3/uL (ref 4.0–10.5)
nRBC: 0 % (ref 0.0–0.2)

## 2018-12-12 LAB — RAPID URINE DRUG SCREEN, HOSP PERFORMED
Amphetamines: NOT DETECTED
Barbiturates: NOT DETECTED
Benzodiazepines: NOT DETECTED
Cocaine: NOT DETECTED
Opiates: NOT DETECTED
Tetrahydrocannabinol: NOT DETECTED

## 2018-12-12 LAB — ACETAMINOPHEN LEVEL
Acetaminophen (Tylenol), Serum: <10 ug/mL — ABNORMAL LOW (ref 10–30)
Acetaminophen (Tylenol), Serum: <10 ug/mL — ABNORMAL LOW (ref 10–30)

## 2018-12-12 LAB — MAGNESIUM: Magnesium: 2.2 mg/dL (ref 1.7–2.4)

## 2018-12-12 LAB — I-STAT BETA HCG BLOOD, ED (MC, WL, AP ONLY): I-stat hCG, quantitative: <5 m[IU]/mL (ref <5)

## 2018-12-12 LAB — ETHANOL: Alcohol, Ethyl (B): <10 mg/dL (ref <10)

## 2018-12-12 LAB — CBG MONITORING, ED: Glucose-Capillary: 90 mg/dL (ref 70–99)

## 2018-12-12 LAB — SALICYLATE LEVEL: Salicylate Lvl: <7 mg/dL (ref 2.8–30.0)

## 2018-12-12 LAB — SARS CORONAVIRUS 2 BY RT PCR (HOSPITAL ORDER, PERFORMED IN ~~LOC~~ HOSPITAL LAB): SARS Coronavirus 2: NEGATIVE

## 2018-12-12 MED ORDER — IBUPROFEN 200 MG PO TABS: 600.0000 mg | ORAL_TABLET | Freq: Three times a day (TID) | ORAL | Status: DC | PRN

## 2018-12-12 MED ORDER — POTASSIUM CHLORIDE CRYS ER 20 MEQ PO TBCR
40.0000 meq | EXTENDED_RELEASE_TABLET | Freq: Once | ORAL | Status: AC
  Administered 2018-12-12: 40 meq via ORAL
  Filled 2018-12-12: qty 2

## 2018-12-12 MED ORDER — SODIUM CHLORIDE 0.9 % IV BOLUS
1000.0000 mL | Freq: Once | INTRAVENOUS | Status: AC
  Administered 2018-12-12: 1000 mL via INTRAVENOUS

## 2018-12-12 MED ORDER — ONDANSETRON HCL 4 MG PO TABS: 4.0000 mg | ORAL_TABLET | Freq: Three times a day (TID) | ORAL | Status: DC | PRN

## 2018-12-12 MED ORDER — POTASSIUM CHLORIDE 10 MEQ/100ML IV SOLN
10.0000 meq | Freq: Once | INTRAVENOUS | Status: AC
  Administered 2018-12-12: 10 meq via INTRAVENOUS
  Filled 2018-12-12: qty 100

## 2018-12-12 MED ORDER — SODIUM CHLORIDE 0.9 % IV BOLUS
1000.0000 mL | Freq: Once | INTRAVENOUS | Status: AC
  Administered 2018-12-12: 15:00:00 1000 mL via INTRAVENOUS

## 2018-12-12 MED ORDER — ALUM & MAG HYDROXIDE-SIMETH 200-200-20 MG/5ML PO SUSP: 30.0000 mL | Freq: Four times a day (QID) | ORAL | Status: DC | PRN

## 2018-12-12 MED ORDER — SODIUM CHLORIDE 0.9 % IV SOLN
INTRAVENOUS | Status: DC
  Administered 2018-12-12: 18:00:00 via INTRAVENOUS

## 2018-12-12 NOTE — ED Triage Notes (Signed)
Patient took 29 Unysom tablets in an attempt to commit suicide 1.5 hours ago. After taking the pills she called [employer] for help. Patient was found at the [facility] at 14. Dizzy and numb in left cheek. She told EMS she was really depressed now. She has a history of depression and tried to commit suicide a year ago.    EMS vitals: 155/90 BP 128 HR 16 RR 99% O2 sat on room air

## 2018-12-12 NOTE — ED Notes (Signed)
Poison control called. Case had been previously opened by MD. Labs collected and recommendations given.

## 2018-12-12 NOTE — ED Provider Notes (Signed)
[place] [facility] DEPT Provider Note   CSN: [id number] Arrival date & time: 12/12/18  1351     History   Chief Complaint Chief Complaint  Patient presents with  . Drug Overdose    HPI Melanie [name] is a 43 y.o. female.     Pt presents to the ED today after taking 80 Unisom pills about 1.5 hrs pta.  The pt said she took the pills to try to sleep.  She denies trying to kill herself, but said she's been under a lot of stress.  She said she can't tell me what is going on, but did tell EMS she's been depressed.  She called the police dept after taking the pills.  She was found by herself at the [facility].      Past Medical History:  Diagnosis Date  . Abdominal pain   . Anxiety    pt denies - noted in several MD/hosp notes   . Asthma    hx of as teenager; induced with running now   . Blood in urine   . Brain tumor ([place]) 2011  . Chills   . Clotting disorder ([place])   . Complication of anesthesia    pt states is severe   . Confusion   . Depression    noted in several past MD notes/hosp notes pt to have major depressive disorder - pt denies   . Embolism - blood clot in left ovary  . Gallstones   . Headache    rarely has but currently has one this am   . Meningioma ([place]) 08/14/2011   S/p craniotomy Encephaloma present non changing on MRI in 08/2011  . Nausea   . Palpitations   . PONV (postoperative nausea and vomiting)   . Rectal bleeding   . Rectal pain   . Seizures ([place])    last one 2011  . TIA (transient ischemic attack)   . Unintentional weight loss   . Visual disturbance   . Weakness     Patient Active Problem List   Diagnosis Date Noted  . S/P laparoscopic surgery 08/24/2014  . Endometriosis of ovary 08/24/2014  . Endometriosis   . Syncope 06/25/2014  . TIA (transient ischemic attack) 08/27/2013  . Pre-syncope 01/30/2012  . Major depressive disorder 11/05/2011  . Dizziness 08/15/2011  . Insomnia 08/15/2011  . Meningioma  ([place]) 08/14/2011    Past Surgical History:  Procedure Laterality Date  . BRAIN SURGERY    . brain tumor removed  2011  . CHOLECYSTECTOMY  12/03/2011   Procedure: LAPAROSCOPIC CHOLECYSTECTOMY WITH INTRAOPERATIVE CHOLANGIOGRAM;  Surgeon: Madilyn Hook, DO;  Location: [facility];  Service: General;  Laterality: N/A;  . LAPAROSCOPIC OVARIAN CYSTECTOMY Bilateral 08/24/2014   Procedure: XI ROBOTIC ASSISTED BILATERAL LAPAROSCOPIC OVARIAN CYSTECTOMY RESECTION OF ENDOMETRIAL CYST;  Surgeon: Everitt Amber, MD;  [facility];  Service: Gynecology;  Laterality: Bilateral;  . ovarian mass removed  2010   left     OB History   No obstetric history on file.      Home Medications    Prior to Admission medications   Medication Sig Start Date End Date Taking? Authorizing Provider  aspirin 81 MG chewable tablet Chew 1 tablet (81 mg total) by mouth daily. Patient not taking: Reported on 12/12/2018 08/28/13   Patrecia Pour, MD  benzonatate (TESSALON) 100 MG capsule Take 1 capsule (100 mg total) by mouth 3 (three) times daily as needed for cough. Patient not taking: Reported on 12/12/2018  04/06/16   Sherwood Gambler, MD  guaiFENesin (ROBITUSSIN) 100 MG/5ML liquid Take 5-10 mLs (100-200 mg total) by mouth every 4 (four) hours as needed for cough. Patient not taking: Reported on 12/12/2018 04/06/16   Sherwood Gambler, MD  ibuprofen (ADVIL,MOTRIN) 400 MG tablet Take 1 tablet (400 mg total) by mouth every 8 (eight) hours as needed for headache or moderate pain. Patient not taking: Reported on 12/12/2018 08/25/14   Everitt Amber, MD  meclizine (ANTIVERT) 25 MG tablet Take 1 tablet (25 mg total) by mouth 3 (three) times daily as needed for dizziness. Patient not taking: Reported on 12/12/2018 08/25/14   Everitt Amber, MD  meclizine (ANTIVERT) 25 MG tablet Take 1 tablet (25 mg total) by mouth 3 (three) times daily as needed for dizziness. Patient not taking: Reported on 12/12/2018 09/14/18   Tegeler, Gwenyth Allegra, MD  Multiple  Vitamin (MULTIVITAMIN WITH MINERALS) TABS tablet Take 1 tablet by mouth every morning. Patient not taking: Reported on 12/12/2018 08/25/14   Everitt Amber, MD  ondansetron (ZOFRAN ODT) 4 MG disintegrating tablet 4mg  ODT q6 hours prn nausea/vomiting Patient not taking: Reported on 12/12/2018 06/28/15   Sherwood Gambler, MD    Family History Family History  Problem Relation Age of Onset  . Depression Mother   . Heart failure Father   . Hypertension Father   . Heart disease Father   . Alcohol abuse Sister   . Alcohol abuse Brother   . Alzheimer's disease Maternal Grandmother   . Diabetes Maternal Grandmother   . Stroke Maternal Grandmother     Social History Social History   Tobacco Use  . Smoking status: Never Smoker  . Smokeless tobacco: Never Used  Substance Use Topics  . Alcohol use: No  . Drug use: No     Allergies   Morphine and related   Review of Systems Review of Systems  Psychiatric/Behavioral: Positive for dysphoric mood.     Physical Exam Updated Vital Signs BP 133/88   Pulse 98   Resp (!) 23   Ht 5\' 1"  (1.549 m)   Wt 68 kg   SpO2 97%   BMI 28.34 kg/m   Physical Exam Vitals signs and nursing note reviewed.  Constitutional:      Appearance: Normal appearance.  HENT:     Head: Normocephalic and atraumatic.     Right Ear: External ear normal.     Left Ear: External ear normal.     Nose: Nose normal.     Mouth/Throat:     Mouth: Mucous membranes are dry.  Eyes:     Extraocular Movements: Extraocular movements intact.     Conjunctiva/sclera: Conjunctivae normal.     Pupils: Pupils are equal, round, and reactive to light.  Neck:     Musculoskeletal: Normal range of motion and neck supple.  Cardiovascular:     Rate and Rhythm: Normal rate and regular rhythm.     Pulses: Normal pulses.     Heart sounds: Normal heart sounds.  Pulmonary:     Effort: Pulmonary effort is normal.     Breath sounds: Normal breath sounds.  Abdominal:     General:  Abdomen is flat. Bowel sounds are normal.     Palpations: Abdomen is soft.  Musculoskeletal: Normal range of motion.  Skin:    General: Skin is warm.  Neurological:     General: No focal deficit present.     Mental Status: She is alert and oriented to person, place, and time.  Psychiatric:  Mood and Affect: Mood is depressed.        Speech: Speech normal.        Behavior: Behavior normal.      ED Treatments / Results  Labs (all labs ordered are listed, but only abnormal results are displayed) Labs Reviewed  COMPREHENSIVE METABOLIC PANEL - Abnormal; Notable for the following components:      Result Value   Potassium 3.2 (*)    CO2 21 (*)    Glucose, Bld 102 (*)    AST 13 (*)    All other components within normal limits  ACETAMINOPHEN LEVEL - Abnormal; Notable for the following components:   Acetaminophen (Tylenol), Serum <10 (*)    All other components within normal limits  CBC WITH DIFFERENTIAL/PLATELET - Abnormal; Notable for the following components:   RBC 5.25 (*)    Hemoglobin 15.3 (*)    All other components within normal limits  ACETAMINOPHEN LEVEL - Abnormal; Notable for the following components:   Acetaminophen (Tylenol), Serum <10 (*)    All other components within normal limits  SARS CORONAVIRUS 2 (TAT 0000000 HRS)  SALICYLATE LEVEL  ETHANOL  RAPID URINE DRUG SCREEN, HOSP PERFORMED  MAGNESIUM  CBG MONITORING, ED  I-STAT BETA HCG BLOOD, ED ([facility], [facility], [facility] ONLY)    EKG EKG Interpretation  Date/Time:  Friday December 12 2018 17:38:22 EDT Ventricular Rate:  92 PR Interval:    QRS Duration: 77 QT Interval:  370 QTC Calculation: 458 R Axis:   -36 Text Interpretation:  Sinus rhythm Left axis deviation Borderline T abnormalities, anterior leads QTc interval has improved Confirmed by Isla Pence [phone]) on 12/12/2018 6:18:28 PM   Radiology No results found.  Procedures Procedures (including critical care time)  Medications Ordered in ED Medications   sodium chloride 0.9 % bolus 1,000 mL (0 mLs Intravenous Stopped 12/12/18 1740)    And  0.9 %  sodium chloride infusion ( Intravenous New Bag/Given 12/12/18 1735)  ibuprofen (ADVIL) tablet 600 mg (has no administration in time range)  ondansetron (ZOFRAN) tablet 4 mg (has no administration in time range)  alum & mag hydroxide-simeth (MAALOX/MYLANTA) 200-200-20 MG/5ML suspension 30 mL (has no administration in time range)  potassium chloride 10 mEq in 100 mL IVPB (10 mEq Intravenous New Bag/Given 12/12/18 1736)  potassium chloride SA (KLOR-CON) CR tablet 40 mEq (40 mEq Oral Given 12/12/18 1738)  sodium chloride 0.9 % bolus 1,000 mL (1,000 mLs Intravenous New Bag/Given 12/12/18 1729)     Initial Impression / Assessment and Plan / ED Course  I have reviewed the triage vital signs and the nursing notes.  Pertinent labs & imaging results that were available during my care of the patient were reviewed by me and considered in my medical decision making (see chart for details).       I spoke with poison control.  They recommend a 6 hr obs.  They recommend replacing potassium as her QTc is slightly prolonged.  Repeat EKG in a few hrs and repeat tylenol at 4 hrs post ingestion.  Repeat tylenol ok.  Mg ok.  Repeat EKG shows normalization of her QTc interval.   HR now down to the 90s.  The pt has been observed now for 6 hrs and is medically stable for psych eval.  TTS consult ordered.  Disposition per TTS.  Final Clinical Impressions(s) / ED Diagnoses   Final diagnoses:  Intentional drug overdose, initial encounter [facility])  Hypokalemia    ED Discharge Orders    None  Isla Pence, MD 12/12/18 Lurline Hare

## 2018-12-12 NOTE — ED Notes (Signed)
Patient refuses Covid test.  

## 2018-12-12 NOTE — ED Notes (Signed)
Poison control called and closed patients case out

## 2018-12-12 NOTE — BH Assessment (Signed)
Tele Assessment Note   Patient Name: Melanie [name] MRN: [id number] Referring Physician: Isla Pence, MD Location of Patient: Melanie [name] ED, [phone] Location of Provider: [place] Department  [name] is an 43 y.o. divorced female who presents unaccompanied to [facility] ED after ingesting 80 tabs of Unisom in a suicide attempt. Pt reports she checked into the [facility] and intentionally ingested the Unisom in a suicide attempt. She then called law enforcement. Pt reports she has felt "very, very depressed" for months. She cannot identify any specific reason why she chose to attempt suicide today. She denies history of suicide attempts but Pt's medical record indicates at least one previous attempt. She acknowledges depressive symptoms including crying spells, social withdrawal and irritability. Pt denies any history of intentional self-injurious behaviors. Pt denies current homicidal ideation or history of violence. Pt denies any history of auditory or visual hallucinations. Pt denies history of alcohol or other substance use.  Pt cannot identify and specific stressors. She does acknowledge financial problems. Pt is receiving disability. She says she lives with her parents and he 16 year old son. Pt cannot identify anyone in her life she considers supportive. She says her aunt and her brother have been diagnosed with schizophrenia. She denies history of abuse or trauma. She denies legal problems. She denies access to firearms. She denies any current mental health providers or history of mental health treatment, however her medical record indicates she was psychiatrically hospitalized for depression in 2013 at [facility].  Pt does not give permission to contact anyone for collateral information.  Pt is dressed in hospital gowb, alert and oriented x4. Pt speaks in a clear tone, at moderate volume and normal pace. Motor behavior appears normal. Eye contact is fair. Pt's mood  is depressed and affect is irritable. Thought process is coherent and relevant. There is no indication Pt is currently responding to internal stimuli or experiencing delusional thought content. Pt says she does not want to be psychiatrically hospitalized, stating she is in the process of moving into a new residence.    Diagnosis: F33.2 Major depressive disorder, Recurrent episode, Severe  Past Medical History:  Past Medical History:  Diagnosis Date  . Abdominal pain   . Anxiety    pt denies - noted in several MD/hosp notes   . Asthma    hx of as teenager; induced with running now   . Blood in urine   . Brain tumor ([place]) 2011  . Chills   . Clotting disorder ([place])   . Complication of anesthesia    pt states is severe   . Confusion   . Depression    noted in several past MD notes/hosp notes pt to have major depressive disorder - pt denies   . Embolism - blood clot in left ovary  . Gallstones   . Headache    rarely has but currently has one this am   . Meningioma ([place]) 08/14/2011   S/p craniotomy Encephaloma present non changing on MRI in 08/2011  . Nausea   . Palpitations   . PONV (postoperative nausea and vomiting)   . Rectal bleeding   . Rectal pain   . Seizures ([place])    last one 2011  . TIA (transient ischemic attack)   . Unintentional weight loss   . Visual disturbance   . Weakness     Past Surgical History:  Procedure Laterality Date  . BRAIN SURGERY    . brain tumor removed  2011  . CHOLECYSTECTOMY  12/03/2011   Procedure: LAPAROSCOPIC CHOLECYSTECTOMY WITH INTRAOPERATIVE CHOLANGIOGRAM;  Surgeon: Madilyn Hook, DO;  Location: [facility];  Service: General;  Laterality: N/A;  . LAPAROSCOPIC OVARIAN CYSTECTOMY Bilateral 08/24/2014   Procedure: XI ROBOTIC ASSISTED BILATERAL LAPAROSCOPIC OVARIAN CYSTECTOMY RESECTION OF ENDOMETRIAL CYST;  Surgeon: Everitt Amber, MD;  [facility];  Service: Gynecology;  Laterality: Bilateral;  . ovarian mass removed  2010   left    Family  History:  Family History  Problem Relation Age of Onset  . Depression Mother   . Heart failure Father   . Hypertension Father   . Heart disease Father   . Alcohol abuse Sister   . Alcohol abuse Brother   . Alzheimer's disease Maternal Grandmother   . Diabetes Maternal Grandmother   . Stroke Maternal Grandmother     Social History:  reports that she has never smoked. She has never used smokeless tobacco. She reports that she does not drink alcohol or use drugs.  Additional Social History:  Alcohol / Drug Use Pain Medications: Denies abuse Prescriptions: Denies abuse Over the Counter: Denies abuse History of alcohol / drug use?: No history of alcohol / drug abuse Longest period of sobriety (when/how long): NA  CIWA: CIWA-Ar BP: 127/88 Pulse Rate: 80 COWS:    Allergies:  Allergies  Allergen Reactions  . Morphine And Related Nausea And Vomiting    Home Medications: (Not in a hospital admission)   OB/GYN Status:  No LMP recorded.  General Assessment Data [facility] of Assessment: [facility] TTS Assessment: In system Is this a Tele or Face-to-Face Assessment?: Tele Assessment Is this an Initial Assessment or a Re-assessment for this encounter?: Initial Assessment Patient Accompanied by:: N/A Language Other than English: No Living Arrangements: (Lives with parents and son (73)) What gender do you identify as?: Female Marital status: Divorced [place] name: NA Pregnancy Status: No Living Arrangements: Parent, Children Can pt return to current living arrangement?: Yes Admission Status: Involuntary Petitioner: ED Attending Is patient capable of signing voluntary admission?: No Referral Source: Self/Family/Friend Insurance type: Medicare, Medicaid     Crisis Care Plan Living Arrangements: Parent, Children Legal Guardian: Other:(Self) Name of Psychiatrist: None Name of Therapist: None  Education Status Is patient currently in school?: No Is the patient employed,  unemployed or receiving disability?: Unemployed  Risk to self with the past 6 months Suicidal Ideation: Yes-Currently Present Has patient been a risk to self within the past 6 months prior to admission? : Yes Suicidal Intent: Yes-Currently Present Has patient had any suicidal intent within the past 6 months prior to admission? : Yes Is patient at risk for suicide?: Yes Suicidal Plan?: Yes-Currently Present Has patient had any suicidal plan within the past 6 months prior to admission? : Yes Specify Current Suicidal Plan: Pt overdosed on 80 tabs of Unisom in suicide attempt Access to Means: Yes Specify Access to Suicidal Means: Overdosed on Unisom What has been your use of drugs/alcohol within the last 12 months?: Pt denies Previous Attempts/Gestures: Yes How many times?: 1 Other Self Harm Risks: None Triggers for Past Attempts: Unknown Intentional Self Injurious Behavior: None Family Suicide History: No Recent stressful life event(s): Other (Comment), Financial Problems(Pt will not disclose) Persecutory voices/beliefs?: No Depression: Yes Depression Symptoms: Despondent, Tearfulness, Isolating, Feeling angry/irritable Substance abuse history and/or treatment for substance abuse?: No Suicide prevention information given to non-admitted patients: Not applicable  Risk to Others within the past 6 months Homicidal Ideation: No Does patient have any lifetime  risk of violence toward others beyond the six months prior to admission? : No Thoughts of Harm to Others: No Current Homicidal Intent: No Current Homicidal Plan: No Access to Homicidal Means: No Identified Victim: None History of harm to others?: No Assessment of Violence: None Noted Violent Behavior Description: Pt denies Does patient have access to weapons?: No Criminal Charges Pending?: No Does patient have a court date: No Is patient on probation?: No  Psychosis Hallucinations: None noted Delusions: None noted  Mental  Status Report Appearance/Hygiene: In hospital gown, Disheveled Eye Contact: Fair Motor Activity: Unremarkable Speech: Logical/coherent Level of Consciousness: Alert Mood: Depressed Affect: Irritable, Depressed Anxiety Level: None Thought Processes: Coherent, Relevant Judgement: Impaired Orientation: Person, Place, Time, Situation Obsessive Compulsive Thoughts/Behaviors: None  Cognitive Functioning Concentration: Decreased Memory: Recent Intact, Remote Intact Is patient IDD: No Insight: Poor Impulse Control: Fair Appetite: Fair Have you had any weight changes? : No Change Sleep: Increased Total Hours of Sleep: 12 Vegetative Symptoms: Staying in bed  ADLScreening [facility] Assessment Services) Patient's cognitive ability adequate to safely complete daily activities?: Yes Patient able to express need for assistance with ADLs?: Yes Independently performs ADLs?: Yes (appropriate for developmental age)  Prior Inpatient Therapy Prior Inpatient Therapy: Yes Prior Therapy Dates: 2013 Prior Therapy Facilty/Provider(s): [facility] Reason for Treatment: Depression  Prior Outpatient Therapy Prior Outpatient Therapy: No Does patient have an ACCT team?: No Does patient have Intensive In-House Services?  : No Does patient have [facility] services? : No Does patient have P4CC services?: No  ADL Screening (condition at time of admission) Patient's cognitive ability adequate to safely complete daily activities?: Yes Is the patient deaf or have difficulty hearing?: No Does the patient have difficulty seeing, even when wearing glasses/contacts?: No Does the patient have difficulty concentrating, remembering, or making decisions?: No Patient able to express need for assistance with ADLs?: Yes Does the patient have difficulty dressing or bathing?: No Independently performs ADLs?: Yes (appropriate for developmental age) Does the patient have difficulty walking or climbing stairs?: No Weakness of  Legs: None Weakness of Arms/Hands: None  Home Assistive Devices/Equipment Home Assistive Devices/Equipment: None    Abuse/Neglect Assessment (Assessment to be complete while patient is alone) Abuse/Neglect Assessment Can Be Completed: Yes Physical Abuse: Denies Verbal Abuse: Denies Sexual Abuse: Denies Exploitation of patient/patient's resources: Denies Self-Neglect: Denies     Regulatory affairs officer (For Healthcare) Does Patient Have a Medical Advance Directive?: No Would patient like information on creating a medical advance directive?: No - Patient declined          Disposition: Lavell Luster, [facility], confirmed bed availability. Gave clinical report to Lindon Romp, FNP who said Pt meets criteria for inpatient psychiatric treatment. Discussed recommendation with Dr. Aileen Pilot who said she would complete IVC paperwork.  Disposition Initial Assessment Completed for this Encounter: Yes  This service was provided via telemedicine using a 2-way, interactive audio and video technology.  Names of all persons participating in this telemedicine service and their role in this encounter. Name: [name] Role: Patient  Name: Storm Frisk, [facility] Role: TTS counselor         Orpah Greek Anson Fret, [facility], [facility], [facility] Triage Specialist [phone]  Evelena Peat 12/12/2018 9:29 PM

## 2018-12-13 ENCOUNTER — Encounter (HOSPITAL_COMMUNITY): Payer: Self-pay

## 2018-12-13 ENCOUNTER — Inpatient Hospital Stay (HOSPITAL_COMMUNITY)
Admission: AD | Admit: 2018-12-13 | Discharge: 2018-12-18 | DRG: 885 | Disposition: A | Discharge status: Home / Self Care | Payer: Medicare Other | Source: Intra-hospital | Attending: Psychiatry | Admitting: Psychiatry

## 2018-12-13 DIAGNOSIS — T426X2A Poisoning by other antiepileptic and sedative-hypnotic drugs, intentional self-harm, initial encounter: Secondary | ICD-10-CM | POA: Diagnosis not present

## 2018-12-13 DIAGNOSIS — R45851 Suicidal ideations: Secondary | ICD-10-CM | POA: Diagnosis present

## 2018-12-13 DIAGNOSIS — Z8673 Personal history of transient ischemic attack (TIA), and cerebral infarction without residual deficits: Secondary | ICD-10-CM | POA: Diagnosis not present

## 2018-12-13 DIAGNOSIS — Z86011 Personal history of benign neoplasm of the brain: Secondary | ICD-10-CM | POA: Diagnosis not present

## 2018-12-13 DIAGNOSIS — Z818 Family history of other mental and behavioral disorders: Secondary | ICD-10-CM

## 2018-12-13 DIAGNOSIS — G4700 Insomnia, unspecified: Secondary | ICD-10-CM | POA: Diagnosis present

## 2018-12-13 DIAGNOSIS — F333 Major depressive disorder, recurrent, severe with psychotic symptoms: Secondary | ICD-10-CM | POA: Diagnosis present

## 2018-12-13 DIAGNOSIS — Z8669 Personal history of other diseases of the nervous system and sense organs: Secondary | ICD-10-CM

## 2018-12-13 DIAGNOSIS — Z915 Personal history of self-harm: Secondary | ICD-10-CM | POA: Diagnosis not present

## 2018-12-13 DIAGNOSIS — Z820 Family history of epilepsy and other diseases of the nervous system: Secondary | ICD-10-CM

## 2018-12-13 DIAGNOSIS — Z9049 Acquired absence of other specified parts of digestive tract: Secondary | ICD-10-CM

## 2018-12-13 DIAGNOSIS — E7800 Pure hypercholesterolemia, unspecified: Secondary | ICD-10-CM | POA: Diagnosis present

## 2018-12-13 DIAGNOSIS — Z823 Family history of stroke: Secondary | ICD-10-CM

## 2018-12-13 DIAGNOSIS — F29 Unspecified psychosis not due to a substance or known physiological condition: Secondary | ICD-10-CM

## 2018-12-13 DIAGNOSIS — F419 Anxiety disorder, unspecified: Secondary | ICD-10-CM | POA: Diagnosis present

## 2018-12-13 DIAGNOSIS — F332 Major depressive disorder, recurrent severe without psychotic features: Secondary | ICD-10-CM | POA: Diagnosis present

## 2018-12-13 DIAGNOSIS — T1491XA Suicide attempt, initial encounter: Secondary | ICD-10-CM | POA: Diagnosis not present

## 2018-12-13 DIAGNOSIS — Z885 Allergy status to narcotic agent status: Secondary | ICD-10-CM | POA: Diagnosis not present

## 2018-12-13 LAB — LIPID PANEL
Cholesterol: 247 mg/dL — ABNORMAL HIGH (ref 0–200)
HDL: 53 mg/dL (ref >40)
LDL Cholesterol: 177 mg/dL — ABNORMAL HIGH (ref 0–99)
Total CHOL/HDL Ratio: 4.7 RATIO
Triglycerides: 84 mg/dL (ref <150)
VLDL: 17 mg/dL (ref 0–40)

## 2018-12-13 LAB — TSH: TSH: 1.162 u[IU]/mL (ref 0.350–4.500)

## 2018-12-13 MED ORDER — LORAZEPAM 0.5 MG PO TABS
0.5000 mg | ORAL_TABLET | Freq: Four times a day (QID) | ORAL | Status: DC | PRN
  Filled 2018-12-13: qty 1

## 2018-12-13 MED ORDER — ALUM & MAG HYDROXIDE-SIMETH 200-200-20 MG/5ML PO SUSP: 30.0000 mL | ORAL | Status: DC | PRN

## 2018-12-13 MED ORDER — DULOXETINE HCL 30 MG PO CPEP
30.0000 mg | ORAL_CAPSULE | Freq: Every day | ORAL | Status: DC
  Administered 2018-12-13 – 2018-12-14 (×2): 30 mg via ORAL
  Filled 2018-12-13 (×5): qty 1

## 2018-12-13 MED ORDER — ONDANSETRON 8 MG PO TBDP
8.0000 mg | ORAL_TABLET | Freq: Once | ORAL | Status: AC
  Administered 2018-12-13: 8 mg via ORAL
  Filled 2018-12-13: qty 1

## 2018-12-13 MED ORDER — MAGNESIUM HYDROXIDE 400 MG/5ML PO SUSP: 30.0000 mL | Freq: Every day | ORAL | Status: DC | PRN

## 2018-12-13 MED ORDER — ARIPIPRAZOLE 5 MG PO TABS
5.0000 mg | ORAL_TABLET | Freq: Every day | ORAL | Status: DC
  Administered 2018-12-13 – 2018-12-14 (×2): 5 mg via ORAL
  Filled 2018-12-13 (×5): qty 1

## 2018-12-13 MED ORDER — POTASSIUM CHLORIDE CRYS ER 10 MEQ PO TBCR
10.0000 meq | EXTENDED_RELEASE_TABLET | Freq: Two times a day (BID) | ORAL | Status: AC
  Administered 2018-12-13 – 2018-12-14 (×3): 10 meq via ORAL
  Filled 2018-12-13 (×4): qty 1

## 2018-12-13 MED ORDER — ACETAMINOPHEN 325 MG PO TABS
650.0000 mg | ORAL_TABLET | Freq: Four times a day (QID) | ORAL | Status: DC | PRN
  Administered 2018-12-14: 650 mg via ORAL
  Filled 2018-12-13: qty 2

## 2018-12-13 MED ORDER — ONDANSETRON 4 MG PO TBDP
ORAL_TABLET | ORAL | Status: AC
  Filled 2018-12-13: qty 2

## 2018-12-13 NOTE — [facility] Counselor (Signed)
Adult Comprehensive Assessment  Patient ID: Melanie [name], female   DOB: February 08, 1976, 43 y.o.   MRN: [id number]  Information Source: Information source: Patient  Current Stressors:  Patient states their primary concerns and needs for treatment are:: Depression Patient states their goals for this hospitilization and ongoing recovery are:: End the depression Educational / Learning stressors: Denies Employment / Job issues: Denies Family Relationships: People are mean for no reason at all. Financial / Lack of resources (include bankruptcy): Not enough money to pay bills. Housing / Lack of housing: Is being asked to move for no reason Physical health (include injuries & life threatening diseases): Denies stressors Social relationships: Denies stressors Substance abuse: Denies stressors Bereavement / Loss: Denies stressors  Living/Environment/Situation:  Living Arrangements: Other relatives, Non-relatives/Friends, Children Living conditions (as described by patient or guardian): Used to be good Who else lives in the home?: Son's grandparents, ex-husband, son How long has patient lived in current situation?: 9 years What is atmosphere in current home: Comfortable  Family History:  Marital status: Divorced Divorced, when?: 10-11 years What types of issues is patient dealing with in the relationship?: Still living in the same house, can be mean. What is your sexual orientation?: Deferred Does patient have children?: Yes How many children?: 1 How is patient's relationship with their children?: 37yo son - not a good relationship  Childhood History:  By whom was/is the patient raised?: Foster parents Additional childhood history information: "Raised in foster care" since age 53yo.  Does not want to talk about this, "it's making me even more depressed. Description of patient's relationship with caregiver when they were a child: Declines to answer Patient's description of current  relationship with people who raised him/her: Declines to answer How were you disciplined when you got in trouble as a child/adolescent?: Declines to answer Does patient have siblings?: (Declines to answer) Did patient suffer any verbal/emotional/physical/sexual abuse as a child?: (Declines to answer, state she does not see the relevance to her current hospital stay) Did patient suffer from severe childhood neglect?: (Declines to answer) Has patient ever been sexually abused/assaulted/raped as an adolescent or adult?: (Declines to answer) Was the patient ever a victim of a crime or a disaster?: (Declines to answer) Witnessed domestic violence?: (Declines to answer) Has patient been effected by domestic violence as an adult?: (Declines to answer)  Education:  Highest grade of school patient has completed: 11th Currently a student?: No Learning disability?: No  Employment/Work Situation:   Employment situation: On disability Why is patient on disability: Brain tumor How long has patient been on disability: 9 years What is the longest time patient has a held a job?: 11 years 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 Did You Receive Any Psychiatric Treatment/Services While in the [employer]?: (No Armed forces logistics/support/administrative officer) Are There Guns or Other Weapons in [place]?: No  Financial Resources:   Financial resources: Teacher, early years/pre, Medicare Does patient have a Programmer, applications or guardian?: Yes Name of representative payee or guardian: Son's aunt  Alcohol/Substance Abuse:   What has been your use of drugs/alcohol within the last 12 months?: Denies Alcohol/Substance Abuse Treatment Hx: Denies past history Has alcohol/substance abuse ever caused legal problems?: No  Social Support System:   Pensions consultant Support System: Poor Describe Community Support System: Son's aunt Type of faith/religion: None How does patient's faith help to cope with current illness?:  N/A  Leisure/Recreation:   Leisure and Hobbies: Deferred  Strengths/Needs:   What is the patient's perception of  their strengths?: "I don't know" Patient states they can use these personal strengths during their treatment to contribute to their recovery: N/A Patient states these barriers may affect/interfere with their treatment: None Patient states these barriers may affect their return to the community: None Other important information patient would like considered in planning for their treatment: None  Discharge Plan:   Currently receiving community mental health services: No Patient states concerns and preferences for aftercare planning are: Medication only Patient states they will know when they are safe and ready for discharge when: "I wanted to leave today because I feel better." Does patient have access to transportation?: Yes Does patient have financial barriers related to discharge medications?: No Patient description of barriers related to discharge medications: Has disability income and insurance Will patient be returning to same living situation after discharge?: Yes  Summary/Recommendations:   Summary and Recommendations (to be completed by the evaluator): Patient is a 43yo female admitted with a suicide attempt by ingesting 80 tabs of Unisom.  She checked into a motel, took the pills, then called 911.  Primary stressors include people in her home being mean "for no reason," lack of supports, financial, strained relationships with family, and being asked to move out of the home where she has lived for 9 years.  She declines to talk about her trauma history, states she was put into foster care at age 27yo and does not see the relevance of that to this hospitalization.  She has no current mental health providers, is only interested in medication management and not in therapy.  She denies substance use.  Patient will benefit from crisis stabilization, medication evaluation, group  therapy and psychoeducation, in addition to case management for discharge planning. At discharge it is recommended that Patient adhere to the established discharge plan and continue in treatment.  Maretta Los. 12/13/2018

## 2018-12-13 NOTE — [facility] Suicide Risk Assessment (Signed)
[facility] Admission Suicide Risk Assessment   Nursing information obtained from:  Patient Demographic factors:  Caucasian, Unemployed Current Mental Status:  Intention to act on suicide plan, Belief that plan would result in death Loss Factors:  NA Historical Factors:  Prior suicide attempts Risk Reduction Factors:  Living with another person, especially a relative  Total Time spent with patient: 45 minutes Principal Problem:  MDD with Psychotic Features  Diagnosis:  Active Problems:   Severe recurrent major depression without psychotic features (HCC)  Subjective Data:   Continued Clinical Symptoms:  Alcohol Use Disorder Identification Test Final Score (AUDIT): 1 The "Alcohol Use Disorders Identification Test", Guidelines for Use in Primary Care, Second Edition.  World Pharmacologist [facility]). Score between 0-7:  no or low risk or alcohol related problems. Score between 8-15:  moderate risk of alcohol related problems. Score between 16-19:  high risk of alcohol related problems. Score 20 or above:  warrants further diagnostic evaluation for alcohol dependence and treatment.   CLINICAL FACTORS:  43 year old female.  Presented to the emergency room following an overdose on over-the-counter sleeping medication (Unisom).  She had checked into a hotel with the purpose of committing suicide and reports she took about 80 tablets.  Later she contacted authorities.  She reports being under significant stress, particularly after being told by her son's grandparents, with whom she lives, that she has to leave soon.  Patient also presents with psychotic symptoms.  She is guarded and evasive but states that she is being followed and "tracked " via her phone and her car, which she describes as another significant stressor.  She denies prior psychiatric admissions or prior history of suicide attempts.  Was not taking any psychiatric medications prior to admission.    Psychiatric Specialty Exam: Physical  Exam  ROS  Blood pressure (!) 146/93, pulse (!) 102, temperature 98.1 F (36.7 C).There is no height or weight on file to calculate BMI.  See admit note MSE   COGNITIVE FEATURES THAT CONTRIBUTE TO RISK:  Closed-mindedness and Loss of executive function    SUICIDE RISK:   Moderate:  Frequent suicidal ideation with limited intensity, and duration, some specificity in terms of plans, no associated intent, good self-control, limited dysphoria/symptomatology, some risk factors present, and identifiable protective factors, including available and accessible social support.  PLAN OF CARE: Patient will be admitted to inpatient psychiatric unit for stabilization and safety. Will provide and encourage milieu participation. Provide medication management and maked adjustments as needed.  Will follow daily.    I certify that inpatient services furnished can reasonably be expected to improve the patient's condition.   Jenne Campus, MD 12/13/2018, 11:46 AM

## 2018-12-13 NOTE — Progress Notes (Signed)
EKG results placed on the outside of shadow chart   Normal sinus rhythm Normal ECG  QT/QTc 456/462 ms

## 2018-12-13 NOTE — [facility] Group Notes (Signed)
LCSW Group Therapy Note  12/13/2018    10:00-11:00am   Type of Therapy and Topic:  Group Therapy: Shame and Its Impact   Participation Level:  Did Not Attend   Description of Group:   In this group, patients shared and discussed that guilt is the negative feeling we have when we've done something wrong, while shame is the negative feeling we have simply about "being."  In listening to each other share, patients learned that humans are all imperfect and that there is no shame in this.  We discussed how it could positively impact our wellbeing by accepting our faults as part of our being that can be worked on but does not have to shame [place].    Therapeutic Goals: 1. Patients will learn the difference between guilt and shame. 2. Patients will share their current shame feelings and how this has impacted their current lives. 3. Patients will explore possible ways to think differently about those parts of their bodies, feelings, and lives about which they do have shame. 4. Patients will learn that shame is universal, and that keeping our shame a secret actually increases its hold on [place].  Summary of Patient Progress:  N/A  Therapeutic Modalities:   Cognitive Behavioral Therapy Motivation Interviewing  Melanie [name]  .

## 2018-12-13 NOTE — Tx Team (Signed)
Initial Treatment Plan 12/13/2018 4:21 AM Melanie [name] [id number]    PATIENT STRESSORS: Other: Patient reports life in general is stressful   PATIENT STRENGTHS: Ability for insight Average or above average intelligence   PATIENT IDENTIFIED PROBLEMS: SI  Depression      " Patient currently has not goal, she was nauseated during admission process. "             DISCHARGE CRITERIA:  Improved stabilization in mood, thinking, and/or behavior  PRELIMINARY DISCHARGE PLAN: Return to previous living arrangement  PATIENT/FAMILY INVOLVEMENT: This treatment plan has been presented to and reviewed with the patient, Melanie [name], and/or family member.  The patient and family have been given the opportunity to ask questions and make suggestions.  Aurora Mask, RN 12/13/2018, 4:21 AM

## 2018-12-13 NOTE — Progress Notes (Signed)
D. Pt presents with an anxious affect/depressed mood-remained in bed for much of the morning-  Pt currently denies SI -reports that she just wants to get home to her pets.   A. Labs and vitals monitored.Pt supported emotionally and encouraged to express concerns and ask questions.   R. Pt remains safe with 15 minute checks. Will continue POC.

## 2018-12-13 NOTE — Progress Notes (Signed)
Patient is 43 year old female admitted involuntarily after taking 80 unisom tablets in a suicide attempt. Patient was very nauseated during admission process. Skin assessment completed. She received zofran to aid with nausea. Patient oriented to unit.

## 2018-12-13 NOTE — Progress Notes (Signed)
[facility] Group Notes:  (Nursing/MHT/Case Management/Adjunct)  Date:  12/13/2018  Time:  1400  Type of Therapy:  Nurse Education  Participation Level:  Did Not Attend

## 2018-12-13 NOTE — Progress Notes (Signed)
[facility] NOVEL CORONAVIRUS (COVID-19) DAILY CHECK-OFF SYMPTOMS - answer yes or no to each - every day NO YES  Have you had a fever in the past 24 hours?  . Fever (Temp > 37.80C / 100F) X   Have you had any of these symptoms in the past 24 hours? . New Cough .  Sore Throat  .  Shortness of Breath .  Difficulty Breathing .  Unexplained Body Aches   X   Have you had any one of these symptoms in the past 24 hours not related to allergies?   . Runny Nose .  Nasal Congestion .  Sneezing   X   If you have had runny nose, nasal congestion, sneezing in the past 24 hours, has it worsened?  X   EXPOSURES - check yes or no X   Have you traveled outside the state in the past 14 days?  X   Have you been in contact with someone with a confirmed diagnosis of COVID-19 or PUI in the past 14 days without wearing appropriate PPE?  X   Have you been living in the same home as a person with confirmed diagnosis of COVID-19 or a PUI (household contact)?    X   Have you been diagnosed with COVID-19?    X              What to do next: Answered NO to all: Answered YES to anything:   Proceed with unit schedule Follow the [facility] Inpatient Flowsheet.   

## 2018-12-13 NOTE — H&P (Signed)
Psychiatric Admission Assessment Adult  Patient Identification: Melanie [name] MRN:  [id number] Date of Evaluation:  12/13/2018 Chief Complaint: " I tried to take my life" Principal Diagnosis:  MDD, with psychotic features. S/P Suicide Attempt Diagnosis: MDD, with psychotic features. S/P Suicide Attempt  History of Present Illness: 43 year old female. Presented to ED yesterday after suicide attempt by overdosing on OTC sleeping medication ( reports took 80 tablets, which she states was " the whole bottle "). She reports she had been having suicidal ideations for a few days prior to attempt and that she had checked self into hotel earlier that day with the purpose of committing suicide . After overdosing she fell asleep but upon waking states " I started thinking about my pets" and decided to contact authorities .  She reports she has been " under a lot of stress" and endorses worsening depression over recent days. She states that her son's grandparents, with whom she lives, have told her she has to move out within the next few days. She states she has been very worried as she does not know where else she could go to.  She also reports she has another issue that " has been stressing me really badly" but states " I can't talk about it".  Endorses neuro-vegetative symptoms as below. She presents with psychotic symptoms - presents reluctant to talk about this issue, and states " I can't really talk about it and it is hard to explain" but states that she has been " tracked " and " followed " for years. States she thinks " it may be through my phone or though my car , I don't know".  Associated Signs/Symptoms: Depression Symptoms:  depressed mood, anhedonia, insomnia, suicidal thoughts with specific plan, suicidal attempt, anxiety, loss of energy/fatigue, decreased appetite, (Hypo) Manic Symptoms:  None noted or endorsed  Anxiety Symptoms:  Reports increased anxiety recently in the context of  stressors. Psychotic Symptoms:  Does not endorse psychotic symptoms PTSD Symptoms: Does not endorse  Total Time spent with patient: 45 minutes  Past Psychiatric History: denies prior psychiatric admissions, denies history of suicide attempts, denies history of self cutting or self injurious behaviors . Denies history of psychosis/hallucinations. Describes long history of depression which she reports started a few years , and which she describes as intermittent . She reports brief mood swings, and states she sometimes has " good days", but currently does not endorse any clear history of mania or hypomania. Denies panic or agoraphobia.  Denies history of violence .  Is the patient at risk to self? Yes.    Has the patient been a risk to self in the past 6 months? No.  Has the patient been a risk to self within the distant past? No.  Is the patient a risk to others? No.  Has the patient been a risk to others in the past 6 months? No.  Has the patient been a risk to others within the distant past? No.   Prior Inpatient Therapy:  Denies Prior Outpatient Therapy:  None current or recent  Alcohol Screening: 1. How often do you have a drink containing alcohol?: Monthly or less 2. How many drinks containing alcohol do you have on a typical day when you are drinking?: 1 or 2 3. How often do you have six or more drinks on one occasion?: Never AUDIT-C Score: 1 4. How often during the last year have you found that you were not able to stop drinking once  you had started?: Never 5. How often during the last year have you failed to do what was normally expected from you becasue of drinking?: Never 6. How often during the last year have you needed a first drink in the morning to get yourself going after a heavy drinking session?: Never 7. How often during the last year have you had a feeling of guilt of remorse after drinking?: Never 8. How often during the last year have you been unable to remember what  happened the night before because you had been drinking?: Never 9. Have you or someone else been injured as a result of your drinking?: No 10. Has a relative or friend or a doctor or another health worker been concerned about your drinking or suggested you cut down?: No Alcohol Use Disorder Identification Test Final Score (AUDIT): 1 Alcohol Brief Interventions/Follow-up: AUDIT Score <7 follow-up not indicated Substance Abuse History in the last 12 months:  Denies alcohol or drug abuse  Consequences of Substance Abuse: Denies  Previous Psychotropic Medications: reports she has never been on any psychiatric medication management in the past  Psychological Evaluations:  No Past Medical History: Reports history of meningioma, which was surgically removed in 2011. Reports she had seizure prior to surgery but not afterwards or since . NKDA. Reports she was not taking any medications prior to admission. Past Medical History:  Diagnosis Date  . Abdominal pain   . Anxiety    pt denies - noted in several MD/hosp notes   . Asthma    hx of as teenager; induced with running now   . Blood in urine   . Brain tumor ([place]) 2011  . Chills   . Clotting disorder ([place])   . Complication of anesthesia    pt states is severe   . Confusion   . Depression    noted in several past MD notes/hosp notes pt to have major depressive disorder - pt denies   . Embolism - blood clot in left ovary  . Gallstones   . Headache    rarely has but currently has one this am   . Meningioma ([place]) 08/14/2011   S/p craniotomy Encephaloma present non changing on MRI in 08/2011  . Nausea   . Palpitations   . PONV (postoperative nausea and vomiting)   . Rectal bleeding   . Rectal pain   . Seizures ([place])    last one 2011  . TIA (transient ischemic attack)   . Unintentional weight loss   . Visual disturbance   . Weakness     Past Surgical History:  Procedure Laterality Date  . BRAIN SURGERY    . brain tumor removed  2011  .  CHOLECYSTECTOMY  12/03/2011   Procedure: LAPAROSCOPIC CHOLECYSTECTOMY WITH INTRAOPERATIVE CHOLANGIOGRAM;  Surgeon: Madilyn Hook, DO;  Location: [facility];  Service: General;  Laterality: N/A;  . LAPAROSCOPIC OVARIAN CYSTECTOMY Bilateral 08/24/2014   Procedure: XI ROBOTIC ASSISTED BILATERAL LAPAROSCOPIC OVARIAN CYSTECTOMY RESECTION OF ENDOMETRIAL CYST;  Surgeon: Everitt Amber, MD;  Location: [facility];  Service: Gynecology;  Laterality: Bilateral;  . ovarian mass removed  2010   left   Family History: father died in 65 from MI. Mother alive . Has two sisters and one brother. Family History  Problem Relation Age of Onset  . Depression Mother   . Heart failure Father   . Hypertension Father   . Heart disease Father   . Alcohol abuse Sister   . Alcohol abuse Brother   . Alzheimer's  disease Maternal Grandmother   . Diabetes Maternal Grandmother   . Stroke Maternal Grandmother    Family Psychiatric  History: mother has a history of depression, has attempted suicide in the past. Brother and sister have history of alcohol use disorder . Tobacco Screening: Have you used any form of tobacco in the last 30 days? (Cigarettes, Smokeless Tobacco, Cigars, and/or Pipes): No Social History: divorced, has an adult son, lives with son's grandparents. On disability. Denies legal issues . Social History   Substance and Sexual Activity  Alcohol Use No     Social History   Substance and Sexual Activity  Drug Use No    Additional Social History:  Allergies:   Allergies  Allergen Reactions  . Morphine And Related Nausea And Vomiting   Lab Results:  Results for orders placed or performed during the hospital encounter of 12/13/18 (from the past 48 hour(s))  Lipid panel     Status: Abnormal   Collection Time: 12/13/18  6:53 AM  Result Value Ref Range   Cholesterol 247 (H) 0 - 200 mg/dL   Triglycerides 84 <150 mg/dL   HDL 53 >40 mg/dL   Total CHOL/HDL Ratio 4.7 RATIO   VLDL 17 0 - 40 mg/dL   LDL  Cholesterol 177 (H) 0 - 99 mg/dL    Comment:        Total Cholesterol/HDL:CHD Risk Coronary Heart Disease Risk Table                     Men   Women  1/2 Average Risk   3.4   3.3  Average Risk       5.0   4.4  2 X Average Risk   9.6   7.1  3 X Average Risk  23.4   11.0        Use the calculated Patient Ratio above and the CHD Risk Table to determine the patient's CHD Risk.        ATP III CLASSIFICATION (LDL):  <100     mg/dL   Optimal  100-129  mg/dL   Near or Above                    Optimal  130-159  mg/dL   Borderline  160-189  mg/dL   High  >190     mg/dL   Very High Performed at [facility] [address], [place], [place] [address]   TSH     Status: None   Collection Time: 12/13/18  6:53 AM  Result Value Ref Range   TSH 1.162 0.350 - 4.500 uIU/mL    Comment: Performed by a 3rd Generation assay with a functional sensitivity of <=0.01 uIU/mL. Performed at [facility], [place] [address], [place], [place] [address]     Blood Alcohol level:  Lab Results  Component Value Date   [facility] <10 12/12/2018   ETH <11 [id number]    Metabolic Disorder Labs:  Lab Results  Component Value Date   HGBA1C 5.1 08/27/2013   MPG 100 08/27/2013   MPG 103 08/13/2011   No results found for: PROLACTIN Lab Results  Component Value Date   CHOL 247 (H) 12/13/2018   TRIG 84 12/13/2018   HDL 53 12/13/2018   CHOLHDL 4.7 12/13/2018   VLDL 17 12/13/2018   LDLCALC 177 (H) 12/13/2018   LDLCALC 142 (H) 08/28/2013    Current Medications: Current Facility-Administered Medications  Medication Dose Route Frequency Provider Last  Rate Last Dose  . acetaminophen (TYLENOL) tablet 650 mg  650 mg Oral Q6H PRN Lindon Romp A, NP      . alum & mag hydroxide-simeth (MAALOX/MYLANTA) 200-200-20 MG/5ML suspension 30 mL  30 mL Oral Q4H PRN Lindon Romp A, NP      . magnesium hydroxide (MILK OF MAGNESIA) suspension 30 mL  30 mL Oral Daily PRN Rozetta Nunnery, NP        PTA Medications: Medications Prior to Admission  Medication Sig Dispense Refill Last Dose  . aspirin 81 MG chewable tablet Chew 1 tablet (81 mg total) by mouth daily. (Patient not taking: Reported on 12/12/2018) 30 tablet 0 Unknown at Unknown time  . benzonatate (TESSALON) 100 MG capsule Take 1 capsule (100 mg total) by mouth 3 (three) times daily as needed for cough. (Patient not taking: Reported on 12/12/2018) 21 capsule 0 Unknown at Unknown time  . guaiFENesin (ROBITUSSIN) 100 MG/5ML liquid Take 5-10 mLs (100-200 mg total) by mouth every 4 (four) hours as needed for cough. 60 mL 0 Unknown at Unknown time  . ibuprofen (ADVIL,MOTRIN) 400 MG tablet Take 1 tablet (400 mg total) by mouth every 8 (eight) hours as needed for headache or moderate pain. (Patient not taking: Reported on 12/12/2018) 30 tablet 0 Unknown at Unknown time  . meclizine (ANTIVERT) 25 MG tablet Take 1 tablet (25 mg total) by mouth 3 (three) times daily as needed for dizziness. (Patient not taking: Reported on 12/12/2018) 30 tablet 0 Unknown at Unknown time  . meclizine (ANTIVERT) 25 MG tablet Take 1 tablet (25 mg total) by mouth 3 (three) times daily as needed for dizziness. (Patient not taking: Reported on 12/12/2018) 30 tablet 0 Unknown at Unknown time  . Multiple Vitamin (MULTIVITAMIN WITH MINERALS) TABS tablet Take 1 tablet by mouth every morning. (Patient not taking: Reported on 12/12/2018) 50 tablet 1 Unknown at Unknown time  . ondansetron (ZOFRAN ODT) 4 MG disintegrating tablet 4mg  ODT q6 hours prn nausea/vomiting (Patient not taking: Reported on 12/12/2018) 10 tablet 0     Musculoskeletal: Strength & Muscle Tone: within normal limits Gait & Station: normal Patient leans: N/A  Psychiatric Specialty Exam: Physical Exam  Review of Systems  Constitutional: Negative.  Negative for chills and fever.  HENT: Negative.   Eyes: Negative.   Respiratory: Negative for cough and shortness of breath.   Cardiovascular: Negative for  chest pain.  Gastrointestinal: Positive for nausea and vomiting. Negative for diarrhea.       Reports vomiting after overdose    Genitourinary: Negative.   Musculoskeletal: Negative.   Skin: Negative.  Negative for rash.  Neurological: Positive for seizures and headaches.       Reports history of a seizure prior to brain tumor diagnosis in 2011, not since then  Endo/Heme/Allergies: Negative.   Psychiatric/Behavioral: Positive for depression, hallucinations and suicidal ideas.  All other systems reviewed and are negative.   Blood pressure (!) 146/93, pulse (!) 102, temperature 98.1 F (36.7 C).There is no height or weight on file to calculate BMI.  General Appearance: Fairly Groomed  Eye Contact:  Fair  Speech:  Normal Rate  Volume:  Normal  Mood:  presents depressed, anxious, reports mood as 5/10  Affect:  constricted, anxious, slightly irritable  Thought Process:  Linear and Descriptions of Associations: Intact  Orientation:  Full (Time, Place, and Person)  Thought Content:  persecutory delusions , no hallucinations, does not appear internally preoccupied   Suicidal Thoughts:  No denies current suicidal or  self injurious ideations, contracts for safety on unit   Homicidal Thoughts:  No  Memory:  recent and remote grossly intact   Judgement:  Fair  Insight:  Lacking  Psychomotor Activity:  Normal- no psychomotor agitation or restlessness   Concentration:  Concentration: Fair and Attention Span: Fair  Recall:  Good  Fund of Knowledge:  Good  Language:  Good  Akathisia:  Negative  Handed:  Right  AIMS (if indicated):     Assets:  Desire for Improvement Resilience  ADL's:  Intact  Cognition:  WNL  Sleep:  Number of Hours: 2.5    Treatment Plan Summary: Daily contact with patient to assess and evaluate symptoms and progress in treatment, Medication management, Plan Inpatient treatment and Medications as below  Observation Level/Precautions:  15 minute checks  Laboratory:   As needed  * of not, patient had elevated QTc on admission to ED ( 628) improved to 458 on 10/2. Will check EKG to monitor QTc    Psychotherapy: Milieu/group therapy  Medications: Agrees to antidepressant trial.  Start Cymbalta 30 mg daily. Start Abilify 5 mgrs QDAY . Start Ativan 0.5 mgrs Q 6 hours PRN  Consultations:  As needed   Discharge Concerns: -  Estimated LOS: 5 days   Other:     Physician Treatment Plan for Primary Diagnosis: MDD, with psychotic features  Long Term Goal(s): Improvement in symptoms so as ready for discharge  Short Term Goals: Ability to identify changes in lifestyle to reduce recurrence of condition will improve, Ability to verbalize feelings will improve, Ability to disclose and discuss suicidal ideas, Ability to demonstrate self-control will improve, Ability to identify and develop effective coping behaviors will improve and Ability to maintain clinical measurements within normal limits will improve  Physician Treatment Plan for Secondary Diagnosis: S/P Suicide Attempt by antihistamine overdose  Long Term Goal(s): Improvement in symptoms so as ready for discharge  Short Term Goals: Ability to identify changes in lifestyle to reduce recurrence of condition will improve, Ability to verbalize feelings will improve, Ability to disclose and discuss suicidal ideas, Ability to demonstrate self-control will improve, Ability to identify and develop effective coping behaviors will improve and Ability to maintain clinical measurements within normal limits will improve  I certify that inpatient services furnished can reasonably be expected to improve the patient's condition.    Jenne Campus, MD 10/3/202010:30 AM

## 2018-12-13 NOTE — ED Notes (Signed)
Pt transferred to [facility] by the [facility]

## 2018-12-13 NOTE — [facility] Suicide Risk Assessment (Signed)
[facility] INPATIENT:  Family/Significant Other Suicide Prevention Education  Suicide Prevention Education:  Patient Refusal for Family/Significant Other Suicide Prevention Education: The patient Melanie [name] has refused to provide written consent for family/significant other to be provided Family/Significant Other Suicide Prevention Education during admission and/or prior to discharge.  Physician notified.  Berlin Hun Grossman-Orr 12/13/2018, 4:36 PM

## 2018-12-13 NOTE — ED Notes (Signed)
[facility] called for transport to [facility] rm [phone]

## 2018-12-14 MED ORDER — ARIPIPRAZOLE 10 MG PO TABS
10.0000 mg | ORAL_TABLET | Freq: Every day | ORAL | Status: DC
  Administered 2018-12-15 – 2018-12-17 (×3): 10 mg via ORAL
  Filled 2018-12-14 (×4): qty 1

## 2018-12-14 MED ORDER — DULOXETINE HCL 20 MG PO CPEP
40.0000 mg | ORAL_CAPSULE | Freq: Every day | ORAL | Status: DC
  Administered 2018-12-15 – 2018-12-16 (×2): 40 mg via ORAL
  Filled 2018-12-14 (×3): qty 2

## 2018-12-14 NOTE — [facility] Group Notes (Signed)
[facility] Group Notes: (Clinical Social Work)   12/14/2018      Type of Therapy:  Group Therapy   Participation Level:  Did Not Attend - was invited both individually by [facility] and by overhead announcement, chose not to attend.   Selmer Dominion, LCSW 12/14/2018, 3:15 PM

## 2018-12-14 NOTE — Progress Notes (Signed)
[facility] NOVEL CORONAVIRUS (COVID-19) DAILY CHECK-OFF SYMPTOMS - answer yes or no to each - every day NO YES  Have you had a fever in the past 24 hours?  . Fever (Temp > 37.80C / 100F) X   Have you had any of these symptoms in the past 24 hours? . New Cough .  Sore Throat  .  Shortness of Breath .  Difficulty Breathing .  Unexplained Body Aches   X   Have you had any one of these symptoms in the past 24 hours not related to allergies?   . Runny Nose .  Nasal Congestion .  Sneezing   X   If you have had runny nose, nasal congestion, sneezing in the past 24 hours, has it worsened?  X   EXPOSURES - check yes or no X   Have you traveled outside the state in the past 14 days?  X   Have you been in contact with someone with a confirmed diagnosis of COVID-19 or PUI in the past 14 days without wearing appropriate PPE?  X   Have you been living in the same home as a person with confirmed diagnosis of COVID-19 or a PUI (household contact)?    X   Have you been diagnosed with COVID-19?    X              What to do next: Answered NO to all: Answered YES to anything:   Proceed with unit schedule Follow the [facility] Inpatient Flowsheet.   

## 2018-12-14 NOTE — Progress Notes (Addendum)
Staff report charge nurse I know pt very well. Charge nurse ask pt if she okay with me doing 15 min safety check. I do feel uncomfortable because I know her but I will remain professional and continue to work with this pt.

## 2018-12-14 NOTE — Progress Notes (Signed)
Pt has remained in bed thus far this shift.  Respirations even and unlabored, no distress noted.  Will conitnue to monitor, patient remains safe on the unit.

## 2018-12-14 NOTE — Progress Notes (Signed)
[facility] MD Progress Note  12/14/2018 11:13 AM Melanie [name]  MRN:  [id number] Subjective: Patient reports some improvement.  She does continue to endorse depression and ruminates about stressors.  Specifically, states that she has been living with her son's paternal grandparents.  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  She also ruminates/worries about her pet rabbit and a feral cat that she has been feeling.  States "if I am not there things are going to let them die". Denies medication side effects.  Currently denies suicidal ideations. Objective : I have reviewed chart notes and have met with patient. 43 year old female.  Presented to the emergency room following an overdose on over-the-counter sleeping medication (Unisom).  She had checked into a hotel with the purpose of committing suicide and reports she took about 80 tablets.  Later she contacted authorities.  She reports being under significant stress, particularly after being told by her son's grandparents, with whom she lives, that she has to leave soon.  Patient also presents with psychotic symptoms.  She is guarded and evasive but states that she is being followed and "tracked " via her phone and her car, which she describes as another significant stressor.  She denies prior psychiatric admissions or prior history of suicide attempts.  Was not taking any psychiatric medications prior to admission.  Today patient presents alert, attentive, with fair eye contact.  She does present more communicative and verbal today, speaking about her stressors as noted above.  Affect remains blunted and constricted, although smiles briefly at times. Denies hallucinations. Continues to express paranoid ideations of being monitored and tracked but remains guarded /minimal on this topic . Does  state " I feel safe in the hospital". Limited group/milieu participation. Polite but somewhat guarded  on approach. No disruptive or agitated behaviors. Denies medication side effects thus far. Denies suicidal ideations and contracts for safety on unit. EKG NSR, QTc 462.  Principal Problem:  MDD, with Psychotic Features. S/P Suicidal Attempt by Overdose  Diagnosis: Active Problems:   Severe recurrent major depression without psychotic features ([place])  Total Time spent with patient: 20 minutes  Past Psychiatric History:   Past Medical History:  Past Medical History:  Diagnosis Date  . Abdominal pain   . Anxiety    pt denies - noted in several MD/hosp notes   . Asthma    hx of as teenager; induced with running now   . Blood in urine   . Brain tumor ([place]) 2011  . Chills   . Clotting disorder ([place])   . Complication of anesthesia    pt states is severe   . Confusion   . Depression    noted in several past MD notes/hosp notes pt to have major depressive disorder - pt denies   . Embolism - blood clot in left ovary  . Gallstones   . Headache    rarely has but currently has one this am   . Meningioma ([place]) 08/14/2011   S/p craniotomy Encephaloma present non changing on MRI in 08/2011  . Nausea   . Palpitations   . PONV (postoperative nausea and vomiting)   . Rectal bleeding   . Rectal pain   . Seizures ([place])    last one 2011  . TIA (transient ischemic attack)   . Unintentional weight  loss   . Visual disturbance   . Weakness     Past Surgical History:  Procedure Laterality Date  . BRAIN SURGERY    . brain tumor removed  2011  . CHOLECYSTECTOMY  12/03/2011   Procedure: LAPAROSCOPIC CHOLECYSTECTOMY WITH INTRAOPERATIVE CHOLANGIOGRAM;  Surgeon: Madilyn Hook, DO;  Location: [place];  Service: General;  Laterality: N/A;  . LAPAROSCOPIC OVARIAN CYSTECTOMY Bilateral 08/24/2014   Procedure: XI ROBOTIC ASSISTED BILATERAL LAPAROSCOPIC OVARIAN CYSTECTOMY RESECTION OF ENDOMETRIAL CYST;   Surgeon: Everitt Amber, MD;  Location: [facility];  Service: Gynecology;  Laterality: Bilateral;  . ovarian mass removed  2010   left   Family History:  Family History  Problem Relation Age of Onset  . Depression Mother   . Heart failure Father   . Hypertension Father   . Heart disease Father   . Alcohol abuse Sister   . Alcohol abuse Brother   . Alzheimer's disease Maternal Grandmother   . Diabetes Maternal Grandmother   . Stroke Maternal Grandmother    Family Psychiatric  History:  Social History:  Social History   Substance and Sexual Activity  Alcohol Use No     Social History   Substance and Sexual Activity  Drug Use No    Social History   Socioeconomic History  . Marital status: Divorced    Spouse name: Not on file  . Number of children: Not on file  . Years of education: Not on file  . Highest education level: Not on file  Occupational History  . Not on file  Social Needs  . Financial resource strain: Patient refused  . Food insecurity    Worry: Patient refused    Inability: Patient refused  . Transportation needs    Medical: Not on file    Non-medical: Not on file  Tobacco Use  . Smoking status: Never Smoker  . Smokeless tobacco: Never Used  Substance and Sexual Activity  . Alcohol use: No  . Drug use: No  . Sexual activity: Not Currently  Lifestyle  . Physical activity    Days per week: Patient refused    Minutes per session: Patient refused  . Stress: Not on file  Relationships  . Social Herbalist on phone: Patient refused    Gets together: Patient refused    Attends religious service: Patient refused    Active member of club or organization: Patient refused    Attends meetings of clubs or organizations: Patient refused    Relationship status: Patient refused  Other Topics Concern  . Not on file  Social History Narrative  . Not on file   Additional Social History:   Sleep: improving ]  Appetite:  Fair  Current  Medications: Current Facility-Administered Medications  Medication Dose Route Frequency Provider Last Rate Last Dose  . acetaminophen (TYLENOL) tablet 650 mg  650 mg Oral Q6H PRN Lindon Romp A, NP   650 mg at 12/14/18 0907  . alum & mag hydroxide-simeth (MAALOX/MYLANTA) 200-200-20 MG/5ML suspension 30 mL  30 mL Oral Q4H PRN Lindon Romp A, NP      . ARIPiprazole (ABILIFY) tablet 5 mg  5 mg Oral Daily Bohdan Macho, Myer Peer, MD   5 mg at 12/14/18 0907  . DULoxetine (CYMBALTA) DR capsule 30 mg  30 mg Oral Daily Ceonna Frazzini, Myer Peer, MD   30 mg at 12/14/18 0907  . LORazepam (ATIVAN) tablet 0.5 mg  0.5 mg Oral Q6H PRN Leahna Hewson, Myer Peer, MD      .  magnesium hydroxide (MILK OF MAGNESIA) suspension 30 mL  30 mL Oral Daily PRN Lindon Romp A, NP      . potassium chloride (KLOR-CON) CR tablet 10 mEq  10 mEq Oral BID Reno Clasby, Myer Peer, MD   10 mEq at 12/14/18 0907    Lab Results:  Results for orders placed or performed during the hospital encounter of 12/13/18 (from the past 48 hour(s))  Lipid panel     Status: Abnormal   Collection Time: 12/13/18  6:53 AM  Result Value Ref Range   Cholesterol 247 (H) 0 - 200 mg/dL   Triglycerides 84 <150 mg/dL   HDL 53 >40 mg/dL   Total CHOL/HDL Ratio 4.7 RATIO   VLDL 17 0 - 40 mg/dL   LDL Cholesterol 177 (H) 0 - 99 mg/dL    Comment:        Total Cholesterol/HDL:CHD Risk Coronary Heart Disease Risk Table                     Men   Women  1/2 Average Risk   3.4   3.3  Average Risk       5.0   4.4  2 X Average Risk   9.6   7.1  3 X Average Risk  23.4   11.0        Use the calculated Patient Ratio above and the CHD Risk Table to determine the patient's CHD Risk.        ATP III CLASSIFICATION (LDL):  <100     mg/dL   Optimal  100-129  mg/dL   Near or Above                    Optimal  130-159  mg/dL   Borderline  160-189  mg/dL   High  >190     mg/dL   Very High Performed at [facility] [address], [place], [place] [address]   TSH      Status: None   Collection Time: 12/13/18  6:53 AM  Result Value Ref Range   TSH 1.162 0.350 - 4.500 uIU/mL    Comment: Performed by a 3rd Generation assay with a functional sensitivity of <=0.01 uIU/mL. Performed at [facility], [place] [address], [place], [place] [address]     Blood Alcohol level:  Lab Results  Component Value Date   [facility] <10 12/12/2018   ETH <11 89/38/1017    Metabolic Disorder Labs: Lab Results  Component Value Date   HGBA1C 5.1 08/27/2013   MPG 100 08/27/2013   MPG 103 08/13/2011   No results found for: PROLACTIN Lab Results  Component Value Date   CHOL 247 (H) 12/13/2018   TRIG 84 12/13/2018   HDL 53 12/13/2018   CHOLHDL 4.7 12/13/2018   VLDL 17 12/13/2018   LDLCALC 177 (H) 12/13/2018   LDLCALC 142 (H) 08/28/2013    Physical Findings: AIMS: Facial and Oral Movements Muscles of Facial Expression: None, normal Lips and Perioral Area: None, normal Jaw: None, normal Tongue: None, normal,Extremity Movements Upper (arms, wrists, hands, fingers): None, normal Lower (legs, knees, ankles, toes): None, normal, Trunk Movements Neck, shoulders, hips: None, normal, Overall Severity Severity of abnormal movements (highest score from questions above): None, normal Incapacitation due to abnormal movements: None, normal Patient's awareness of abnormal movements (rate only patient's report): No Awareness, Dental Status Current problems with teeth and/or dentures?: No Does patient usually wear dentures?: No  CIWA:  COWS:     Musculoskeletal: Strength & Muscle Tone: within normal limits Gait & Station: normal Patient leans: N/A  Psychiatric Specialty Exam: Physical Exam  ROS denies headache, denies chest pain, no shortness of breath, no vomiting , no fever or chills   Blood pressure 125/90, pulse (!) 105, temperature 98.5 F (36.9 C), temperature source Oral.There is no height or weight on file to calculate BMI.  General Appearance:  Fairly Groomed  Eye Contact:  Fair  Speech:  Normal Rate  Volume:  Normal  Mood:  depressed   Affect:  blunted, vaguely guarded  Thought Process:  Linear and Descriptions of Associations: Intact  Orientation:  Other:  fully alert and attentive  Thought Content:  (+) paranoid/persecutory ideations, no hallucinations reported or endorsed and does not appear internally preoccupied   Suicidal Thoughts:  No today denies suicidal or self injurious ideations and contracts for safety on unit   Homicidal Thoughts:  No  Memory:  recent and remote grossly intact   Judgement:  Other:  fair   Insight:  Fair  Psychomotor Activity:  Normal- no psychomotor agitation or restlessness   Concentration:  Concentration: Fair and Attention Span: Fair  Recall:  Good  Fund of Knowledge:  Good  Language:  Good  Akathisia:  Negative  Handed:  Right  AIMS (if indicated):     Assets:  Desire for Improvement Resilience  ADL's:  Intact  Cognition:  WNL  Sleep:  Number of Hours: 6.75   Assessment- 43 year old female.  Presented to the emergency room following an overdose on over-the-counter sleeping medication (Unisom).  She had checked into a hotel with the purpose of committing suicide and reports she took about 80 tablets.  Later she contacted authorities.  She reports being under significant stress, particularly after being told by her son's grandparents, with whom she lives, that she has to leave soon.  Patient also presents with psychotic symptoms.  She is guarded and evasive but states that she is being followed and "tracked " via her phone and her car, which she describes as another significant stressor.  She denies prior psychiatric admissions or prior history of suicide attempts.  Was not taking any psychiatric medications prior to admission.  Today patient presents depressed, blunted in affect and vaguely guarded. She does present more conversant and communicative, and reports feeling safe on unit. Denies  SI at this time and contracts for safety on unit. (+) paranoid,persecutory ideations. Tolerating Cymbalta and Abilify well thus far .  Treatment Plan Summary: Daily contact with patient to assess and evaluate symptoms and progress in treatment, Medication management, Plan inpatient treatment  and medications as below Encourage group and milieu participation  Increase Abilify to 10 mgrs QDAY for mood disorder and psychosis/paranoia Increase Cymbalta to 40 mgrs QDAY for depression , anxiety Continue Ativan 0.5 mgrs Q 6 hours PRN for anxiety as needed  Treatment team working on disposition planning options Jenne Campus, MD 12/14/2018, 11:13 AM

## 2018-12-14 NOTE — Progress Notes (Signed)
D. Pt reports somewhat of an improving mood, but still presents as very depressed, anxious and somewhat guarded-  Pt currently denies SI/HI and AVH A. Labs and vitals monitored. Pt compliant with medications. Pt supported emotionally and encouraged to express concerns and ask questions.   R. Pt remains safe with 15 minute checks. Will continue POC.

## 2018-12-15 NOTE — Progress Notes (Signed)
[facility] MD Progress Note  12/15/2018 4:50 PM Melanie [name]  MRN:  [id number] Subjective: Patient reports she is feeling better.  Currently denies suicidal ideations.  Focused on being discharged soon.  States she plans to live with a friend ( Ms. Shelly Bombard) who is an Therapist, sports and will provide a more supportive environment potentially where she was living prior to admission.  Ruminates about her pet rabbits and whether they have been taking care of while she is in the hospital.  Denies medication side effects. Objective : I have discussed case with treatment team and have met with patient. 43 year old female.  Presented to the emergency room following an overdose on over-the-counter sleeping medication (Unisom).  She had checked into a hotel with the purpose of committing suicide and reports she took about 80 tablets.  Later she contacted authorities.  She reports being under significant stress, particularly after being told by her son's grandparents, with whom she lives, that she has to leave soon.  Patient also presents with psychotic symptoms.  She is guarded and evasive but states that she is being followed and "tracked " via her phone and her car, which she describes as another significant stressor.  She denies prior psychiatric admissions or prior history of suicide attempts.  Was not taking any psychiatric medications prior to admission.  Patient presents alert, attentive, vaguely anxious.  Eye contact has improved.  Describes, as above, improved mood and denies any suicidal ideations.  Does continue to present with a constricted/anxious affect.  Today presents more future oriented, remains focused on discharge soon, states she will be able to go live with a friend following discharge (has reported that relationship strain and being treated poorly at her current place of residence by her son's paternal grandparents has been a significant stressor/contributed to depression) Does not currently spontaneously speak  backslash mentioned paranoid ideations (on admission had reported she felt she was being "tracked" and monitored via her phone and car .  Today remains vaguely guarded and when this issue explored stated she did not want to talk about it. Limited participation in milieu, spends most time in her room.  No disruptive or agitated behaviors. Denies side effects. Labs reviewed-TSH 1.16, lipid panel remarkable for mild hypercholesterolemia. Principal Problem:  MDD, with Psychotic Features. S/P Suicidal Attempt by Overdose  Diagnosis: Active Problems:   Severe recurrent major depression without psychotic features ([place])  Total Time spent with patient: 20 minutes  Past Psychiatric History:   Past Medical History:  Past Medical History:  Diagnosis Date  . Abdominal pain   . Anxiety    pt denies - noted in several MD/hosp notes   . Asthma    hx of as teenager; induced with running now   . Blood in urine   . Brain tumor ([place]) 2011  . Chills   . Clotting disorder ()   . Complication of anesthesia    pt states is severe   . Confusion   . Depression    noted in several past MD notes/hosp notes pt to have major depressive disorder - pt denies   . Embolism - blood clot in left ovary  . Gallstones   . Headache    rarely has but currently has one this am   . Meningioma ([place]) 08/14/2011   S/p craniotomy Encephaloma present non changing on MRI in 08/2011  . Nausea   . Palpitations   . PONV (postoperative nausea and vomiting)   . Rectal bleeding   .  Rectal pain   . Seizures ([place])    last one 2011  . TIA (transient ischemic attack)   . Unintentional weight loss   . Visual disturbance   . Weakness     Past Surgical History:  Procedure Laterality Date  . BRAIN SURGERY    . brain tumor removed  2011  . CHOLECYSTECTOMY  12/03/2011   Procedure: LAPAROSCOPIC CHOLECYSTECTOMY WITH INTRAOPERATIVE CHOLANGIOGRAM;  Surgeon: Madilyn Hook, DO;  Location: [place];  Service: General;  Laterality: N/A;  .  LAPAROSCOPIC OVARIAN CYSTECTOMY Bilateral 08/24/2014   Procedure: XI ROBOTIC ASSISTED BILATERAL LAPAROSCOPIC OVARIAN CYSTECTOMY RESECTION OF ENDOMETRIAL CYST;  Surgeon: Everitt Amber, MD;  Location: [facility];  Service: Gynecology;  Laterality: Bilateral;  . ovarian mass removed  2010   left   Family History:  Family History  Problem Relation Age of Onset  . Depression Mother   . Heart failure Father   . Hypertension Father   . Heart disease Father   . Alcohol abuse Sister   . Alcohol abuse Brother   . Alzheimer's disease Maternal Grandmother   . Diabetes Maternal Grandmother   . Stroke Maternal Grandmother    Family Psychiatric  History:  Social History:  Social History   Substance and Sexual Activity  Alcohol Use No     Social History   Substance and Sexual Activity  Drug Use No    Social History   Socioeconomic History  . Marital status: Divorced    Spouse name: Not on file  . Number of children: Not on file  . Years of education: Not on file  . Highest education level: Not on file  Occupational History  . Not on file  Social Needs  . Financial resource strain: Patient refused  . Food insecurity    Worry: Patient refused    Inability: Patient refused  . Transportation needs    Medical: Not on file    Non-medical: Not on file  Tobacco Use  . Smoking status: Never Smoker  . Smokeless tobacco: Never Used  Substance and Sexual Activity  . Alcohol use: No  . Drug use: No  . Sexual activity: Not Currently  Lifestyle  . Physical activity    Days per week: Patient refused    Minutes per session: Patient refused  . Stress: Not on file  Relationships  . Social Herbalist on phone: Patient refused    Gets together: Patient refused    Attends religious service: Patient refused    Active member of club or organization: Patient refused    Attends meetings of clubs or organizations: Patient refused    Relationship status: Patient refused  Other Topics  Concern  . Not on file  Social History Narrative  . Not on file   Additional Social History:   Sleep: Good  Appetite:  Fair  Current Medications: Current Facility-Administered Medications  Medication Dose Route Frequency Provider Last Rate Last Dose  . acetaminophen (TYLENOL) tablet 650 mg  650 mg Oral Q6H PRN Lindon Romp A, NP   650 mg at 12/14/18 0907  . alum & mag hydroxide-simeth (MAALOX/MYLANTA) 200-200-20 MG/5ML suspension 30 mL  30 mL Oral Q4H PRN Lindon Romp A, NP      . ARIPiprazole (ABILIFY) tablet 10 mg  10 mg Oral Daily Cobos, Myer Peer, MD   10 mg at 12/15/18 0918  . DULoxetine (CYMBALTA) DR capsule 40 mg  40 mg Oral Daily Cobos, Myer Peer, MD   40 mg at  12/15/18 0093  . LORazepam (ATIVAN) tablet 0.5 mg  0.5 mg Oral Q6H PRN Cobos, Myer Peer, MD      . magnesium hydroxide (MILK OF MAGNESIA) suspension 30 mL  30 mL Oral Daily PRN Rozetta Nunnery, NP        Lab Results:  No results found for this or any previous visit (from the past 73 hour(s)).  Blood Alcohol level:  Lab Results  Component Value Date   [facility] <10 12/12/2018   ETH <11 81/82/9937    Metabolic Disorder Labs: Lab Results  Component Value Date   HGBA1C 5.1 08/27/2013   MPG 100 08/27/2013   MPG 103 08/13/2011   No results found for: PROLACTIN Lab Results  Component Value Date   CHOL 247 (H) 12/13/2018   TRIG 84 12/13/2018   HDL 53 12/13/2018   CHOLHDL 4.7 12/13/2018   VLDL 17 12/13/2018   LDLCALC 177 (H) 12/13/2018   LDLCALC 142 (H) 08/28/2013    Physical Findings: AIMS: Facial and Oral Movements Muscles of Facial Expression: None, normal Lips and Perioral Area: None, normal Jaw: None, normal Tongue: None, normal,Extremity Movements Upper (arms, wrists, hands, fingers): None, normal Lower (legs, knees, ankles, toes): None, normal, Trunk Movements Neck, shoulders, hips: None, normal, Overall Severity Severity of abnormal movements (highest score from questions above): None,  normal Incapacitation due to abnormal movements: None, normal Patient's awareness of abnormal movements (rate only patient's report): No Awareness, Dental Status Current problems with teeth and/or dentures?: No Does patient usually wear dentures?: No  CIWA:    COWS:     Musculoskeletal: Strength & Muscle Tone: within normal limits Gait & Station: normal Patient leans: N/A  Psychiatric Specialty Exam: Physical Exam  ROS denies headache, denies chest pain, no shortness of breath, no cough, no vomiting , no fever or chills   Blood pressure 117/84, pulse (!) 105, temperature 98.5 F (36.9 C), temperature source Oral.There is no height or weight on file to calculate BMI.  General Appearance: Improving grooming  Eye Contact:  Improving eye contact  Speech:  Normal Rate  Volume:  Normal  Mood:  Reports feeling better and minimizes depression today.  Affect:  Remains anxious and constricted  Thought Process:  Linear and Descriptions of Associations: Intact  Orientation:  Other:  fully alert and attentive  Thought Content:  No hallucinations, does not appear internally preoccupied, paranoid ideations as above.  Suicidal Thoughts:  No denies suicidal or self-injurious ideations.  Denies any violent or homicidal ideations  Homicidal Thoughts:  No  Memory:  recent and remote grossly intact   Judgement:  Other:  fair   Insight:  Fair  Psychomotor Activity:  Normal- no psychomotor agitation or restlessness   Concentration:  Concentration: Improving and Attention Span: Improving  Recall:  Good  Fund of Knowledge:  Good  Language:  Good  Akathisia:  Negative  Handed:  Right  AIMS (if indicated):     Assets:  Desire for Improvement Resilience  ADL's:  Intact  Cognition:  WNL  Sleep:  Number of Hours: 6.5   Assessment- 43 year old female.  Presented to the emergency room following an overdose on over-the-counter sleeping medication (Unisom).  She had checked into a hotel with the purpose  of committing suicide and reports she took about 80 tablets.  Later she contacted authorities.  She reports being under significant stress, particularly after being told by her son's grandparents, with whom she lives, that she has to leave soon.  Patient also presents with  psychotic symptoms.  She is guarded and evasive but states that she is being followed and "tracked " via her phone and her car, which she describes as another significant stressor.  She denies prior psychiatric admissions or prior history of suicide attempts.  Was not taking any psychiatric medications prior to admission.  Patient presents with some improvement (improved grooming, improved eye contact, more communicative) and today minimizes depression, denies suicidal ideations, and focuses on being discharged home.  She states she plans to go stay with a friend after discharge which will provide her a more supportive environment and when she was living and prior to admission.  She does, however, continue to present with a blunted, anxious, guarded affect and limited interactions with peers/staff, limited milieu participation.  Tolerating Cymbalta and Abilify well thus far  Treatment Plan Summary: Daily contact with patient to assess and evaluate symptoms and progress in treatment, Medication management, Plan inpatient treatment  and medications as below  Treatment plan reviewed as below today 10/5 Encourage group and milieu participation  Continue Abilify  10 mgrs QDAY for mood disorder and psychosis/paranoia Continue Cymbalta  40 mgrs QDAY for depression , anxiety Continue Ativan 0.5 mgrs Q 6 hours PRN for anxiety as needed  Treatment team working on disposition planning options Jenne Campus, MD 12/15/2018, 4:50 PM   Patient ID: Melanie [name], female   DOB: November 09, 1975, 43 y.o.   MRN: [id number]

## 2018-12-15 NOTE — Progress Notes (Signed)
Recreation Therapy Notes  Date:  10.5.20 Time: 0930 Location: [address] Dayroom  Group Topic: Stress Management  Goal Area(s) Addresses:  Patient will identify positive stress management techniques. Patient will identify benefits of using stress management post d/c.  Intervention: Stress Management  Activity : Music.  Patients were given the opportunity to request songs that were soothing, peaceful and feel good.  Songs were to be appropriate meaning no cursing, not sexually explicit or any language about drug/alcohol use.   Education:  Stress Management, Discharge Planning.   Education Outcome: Acknowledges Education  Clinical Observations/Feedback: Pt did not attend activity.     Victorino Sparrow, LRT/CTRS         Victorino Sparrow A 12/15/2018 10:42 AM

## 2018-12-15 NOTE — Progress Notes (Signed)
DAR NOTE: Patient presents with flat affect and depressed mood.  Denies suicidal thoughts, auditory and visual hallucinations.  .  Maintained on routine safety checks.  Medications given as prescribed.  Support and encouragement offered as needed.  Patient is withdrawn and isolates to her room.  Minimal interaction with staff.  Offered no complaint.

## 2018-12-15 NOTE — Progress Notes (Signed)
D.  Pt has been in bed from start of shift, resting with eyes closed, respirations even and unlabored.  No distress noted.  A.  Will continue to monitor  R. Pt remains safe on the unit.

## 2018-12-15 NOTE — [facility] Group Notes (Signed)
LCSW Group Therapy Note 12/15/2018 2:44 PM  Type of Therapy and Topic: Group Therapy: Overcoming Obstacles  Participation Level: Did Not Attend  Description of Group:  In this group patients will be encouraged to explore what they see as obstacles to their own wellness and recovery. They will be guided to discuss their thoughts, feelings, and behaviors related to these obstacles. The group will process together ways to cope with barriers, with attention given to specific choices patients can make. Each patient will be challenged to identify changes they are motivated to make in order to overcome their obstacles. This group will be process-oriented, with patients participating in exploration of their own experiences as well as giving and receiving support and challenge from other group members.  Therapeutic Goals: 1. Patient will identify personal and current obstacles as they relate to admission. 2. Patient will identify barriers that currently interfere with their wellness or overcoming obstacles.  3. Patient will identify feelings, thought process and behaviors related to these barriers. 4. Patient will identify two changes they are willing to make to overcome these obstacles:   Summary of Patient Progress  Invited, chose not to attend.    Therapeutic Modalities:  Cognitive Behavioral Therapy Solution Focused Therapy Motivational Interviewing Relapse Prevention Therapy   Theresa Duty Clinical Social Worker

## 2018-12-15 NOTE — Progress Notes (Deleted)
Staff report charge nurse I know pt very well. Charge nurse ask pt if she okay with me doing 15 min safety check. I do feel uncomfortable because I know her but I will remain professional and continue to work with this pt. This is not supposed to be part of the pt's note.

## 2018-12-15 NOTE — Progress Notes (Signed)
Psychoeducational Group Note  Date:  12/15/2018 Time:  2123  Group Topic/Focus:  Wrap-Up Group:   The focus of this group is to help patients review their daily goal of treatment and discuss progress on daily workbooks.  Participation Level: Did Not Attend  Participation Quality:  Not Applicable  Affect:  Not Applicable  Cognitive:  Not Applicable  Insight:  Not Applicable  Engagement in Group: Not Applicable  Additional Comments: The patient did not attend group since she was asleep in her bed.   Archie Balboa S 12/15/2018, 9:23 PM

## 2018-12-15 NOTE — Tx Team (Signed)
Interdisciplinary Treatment and Diagnostic Plan Update  12/15/2018 Time of Session: 9:00am Melanie [name] MRN: [id number]  Principal Diagnosis: <principal problem not specified>  Secondary Diagnoses: Active Problems:   Severe recurrent major depression without psychotic features (HCC)   Current Medications:  Current Facility-Administered Medications  Medication Dose Route Frequency Provider Last Rate Last Dose  . acetaminophen (TYLENOL) tablet 650 mg  650 mg Oral Q6H PRN Lindon Romp A, NP   650 mg at 12/14/18 0907  . alum & mag hydroxide-simeth (MAALOX/MYLANTA) 200-200-20 MG/5ML suspension 30 mL  30 mL Oral Q4H PRN Lindon Romp A, NP      . ARIPiprazole (ABILIFY) tablet 10 mg  10 mg Oral Daily Cobos, Myer Peer, MD   10 mg at 12/15/18 0918  . DULoxetine (CYMBALTA) DR capsule 40 mg  40 mg Oral Daily Cobos, Myer Peer, MD   40 mg at 12/15/18 0918  . LORazepam (ATIVAN) tablet 0.5 mg  0.5 mg Oral Q6H PRN Cobos, Fernando A, MD      . magnesium hydroxide (MILK OF MAGNESIA) suspension 30 mL  30 mL Oral Daily PRN Lindon Romp A, NP       PTA Medications: Medications Prior to Admission  Medication Sig Dispense Refill Last Dose  . aspirin 81 MG chewable tablet Chew 1 tablet (81 mg total) by mouth daily. (Patient not taking: Reported on 12/12/2018) 30 tablet 0 Unknown at Unknown time  . benzonatate (TESSALON) 100 MG capsule Take 1 capsule (100 mg total) by mouth 3 (three) times daily as needed for cough. (Patient not taking: Reported on 12/12/2018) 21 capsule 0 Unknown at Unknown time  . guaiFENesin (ROBITUSSIN) 100 MG/5ML liquid Take 5-10 mLs (100-200 mg total) by mouth every 4 (four) hours as needed for cough. 60 mL 0 Unknown at Unknown time  . ibuprofen (ADVIL,MOTRIN) 400 MG tablet Take 1 tablet (400 mg total) by mouth every 8 (eight) hours as needed for headache or moderate pain. (Patient not taking: Reported on 12/12/2018) 30 tablet 0 Unknown at Unknown time  . meclizine (ANTIVERT) 25 MG  tablet Take 1 tablet (25 mg total) by mouth 3 (three) times daily as needed for dizziness. (Patient not taking: Reported on 12/12/2018) 30 tablet 0 Unknown at Unknown time  . meclizine (ANTIVERT) 25 MG tablet Take 1 tablet (25 mg total) by mouth 3 (three) times daily as needed for dizziness. (Patient not taking: Reported on 12/12/2018) 30 tablet 0 Unknown at Unknown time  . Multiple Vitamin (MULTIVITAMIN WITH MINERALS) TABS tablet Take 1 tablet by mouth every morning. (Patient not taking: Reported on 12/12/2018) 50 tablet 1 Unknown at Unknown time  . ondansetron (ZOFRAN ODT) 4 MG disintegrating tablet 4mg  ODT q6 hours prn nausea/vomiting (Patient not taking: Reported on 12/12/2018) 10 tablet 0     Patient Stressors: Other: Patient reports life in general is stressful  Patient Strengths: Ability for insight Average or above average intelligence  Treatment Modalities: Medication Management, Group therapy, Case management,  1 to 1 session with clinician, Psychoeducation, Recreational therapy.   Physician Treatment Plan for Primary Diagnosis: <principal problem not specified> Long Term Goal(s): Improvement in symptoms so as ready for discharge Improvement in symptoms so as ready for discharge   Short Term Goals: Ability to identify changes in lifestyle to reduce recurrence of condition will improve Ability to verbalize feelings will improve Ability to disclose and discuss suicidal ideas Ability to demonstrate self-control will improve Ability to identify and develop effective coping behaviors will improve Ability to maintain clinical  measurements within normal limits will improve Ability to identify changes in lifestyle to reduce recurrence of condition will improve Ability to verbalize feelings will improve Ability to disclose and discuss suicidal ideas Ability to demonstrate self-control will improve Ability to identify and develop effective coping behaviors will improve Ability to maintain  clinical measurements within normal limits will improve  Medication Management: Evaluate patient's response, side effects, and tolerance of medication regimen.  Therapeutic Interventions: 1 to 1 sessions, Unit Group sessions and Medication administration.  Evaluation of Outcomes: Progressing  Physician Treatment Plan for Secondary Diagnosis: Active Problems:   Severe recurrent major depression without psychotic features ([place])  Long Term Goal(s): Improvement in symptoms so as ready for discharge Improvement in symptoms so as ready for discharge   Short Term Goals: Ability to identify changes in lifestyle to reduce recurrence of condition will improve Ability to verbalize feelings will improve Ability to disclose and discuss suicidal ideas Ability to demonstrate self-control will improve Ability to identify and develop effective coping behaviors will improve Ability to maintain clinical measurements within normal limits will improve Ability to identify changes in lifestyle to reduce recurrence of condition will improve Ability to verbalize feelings will improve Ability to disclose and discuss suicidal ideas Ability to demonstrate self-control will improve Ability to identify and develop effective coping behaviors will improve Ability to maintain clinical measurements within normal limits will improve     Medication Management: Evaluate patient's response, side effects, and tolerance of medication regimen.  Therapeutic Interventions: 1 to 1 sessions, Unit Group sessions and Medication administration.  Evaluation of Outcomes: Progressing   RN Treatment Plan for Primary Diagnosis: <principal problem not specified> Long Term Goal(s): Knowledge of disease and therapeutic regimen to maintain health will improve  Short Term Goals: Ability to disclose and discuss suicidal ideas, Ability to identify and develop effective coping behaviors will improve and Compliance with prescribed  medications will improve  Medication Management: RN will administer medications as ordered by provider, will assess and evaluate patient's response and provide education to patient for prescribed medication. RN will report any adverse and/or side effects to prescribing provider.  Therapeutic Interventions: 1 on 1 counseling sessions, Psychoeducation, Medication administration, Evaluate responses to treatment, Monitor vital signs and CBGs as ordered, Perform/monitor CIWA, COWS, AIMS and Fall Risk screenings as ordered, Perform wound care treatments as ordered.  Evaluation of Outcomes: Progressing   LCSW Treatment Plan for Primary Diagnosis: <principal problem not specified> Long Term Goal(s): Safe transition to appropriate next level of care at discharge, Engage patient in therapeutic group addressing interpersonal concerns.  Short Term Goals: Engage patient in aftercare planning with referrals and resources, Increase social support, Increase emotional regulation, Identify triggers associated with mental health/substance abuse issues and Increase skills for wellness and recovery  Therapeutic Interventions: Assess for all discharge needs, 1 to 1 time with Social worker, Explore available resources and support systems, Assess for adequacy in community support network, Educate family and significant other(s) on suicide prevention, Complete Psychosocial Assessment, Interpersonal group therapy.  Evaluation of Outcomes: Progressing   Progress in Treatment: Attending groups: No. Participating in groups: No. Taking medication as prescribed: Yes. Toleration medication: Yes. Family/Significant other contact made: No, will contact:  SPE reviewed with patient, declines consents. Patient understands diagnosis: Yes. Discussing patient identified problems/goals with staff: No. Medical problems stabilized or resolved: No. Denies suicidal/homicidal ideation: No. Issues/concerns per patient self-inventory:  Yes.  New problem(s) identified: Yes, Describe:  family and financial stressors.  New Short Term/Long Term Goal(s):  medication  management for mood stabilization; elimination of SI thoughts; development of comprehensive mental wellness/sobriety plan.  Patient Goals: "Move from where I'm at." Asked about discharging today as well.    Discharge Plan or Barriers: Unclear at this time, she is agreeable to outpatient medication management only. CSW assessing for appropriate referrals.   Reason for Continuation of Hospitalization: Anxiety Depression Medication stabilization Suicidal ideation  Estimated Length of Stay: 3-5 days  Attendees: Patient: Melanie [name] 12/15/2018 10:17 AM  Physician: Larene Beach 12/15/2018 10:17 AM  Nursing: Benjamine Mola, RN 12/15/2018 10:17 AM  RN Care Manager: 12/15/2018 10:17 AM  Social Worker: Stephanie Acre, [place] 12/15/2018 10:17 AM  Recreational Therapist:  12/15/2018 10:17 AM  Other: Harriett Sine, NP 12/15/2018 10:17 AM  Other:  12/15/2018 10:17 AM  Other: 12/15/2018 10:17 AM    Scribe for Treatment Team: Joellen Jersey, [place] 12/15/2018 10:17 AM

## 2018-12-15 NOTE — Progress Notes (Signed)
Adult Psychoeducational Group Note  Date:  12/15/2018 Time:  3:43 AM  Group Topic/Focus:  Wrap-Up Group:   The focus of this group is to help patients review their daily goal of treatment and discuss progress on daily workbooks.  Participation Level:  Did Not Attend  Participation Quality:  pt did not attend  Affect:  pt did not attend  Cognitive:  pt did not attend group.  Insight: None  Engagement in Group:  pt did not attend group.  Modes of Intervention:  pt did not attend group.  Additional Comments:  Pt did not attend wrap up group.  Lenice Llamas Long 12/15/2018, 3:43 AM

## 2018-12-16 DIAGNOSIS — F333 Major depressive disorder, recurrent, severe with psychotic symptoms: Secondary | ICD-10-CM

## 2018-12-16 DIAGNOSIS — F29 Unspecified psychosis not due to a substance or known physiological condition: Secondary | ICD-10-CM

## 2018-12-16 MED ORDER — DULOXETINE HCL 60 MG PO CPEP
60.0000 mg | ORAL_CAPSULE | Freq: Every day | ORAL | Status: DC
  Administered 2018-12-17 – 2018-12-18 (×2): 60 mg via ORAL
  Filled 2018-12-16 (×4): qty 1

## 2018-12-16 MED ORDER — DULOXETINE HCL 20 MG PO CPEP
20.0000 mg | ORAL_CAPSULE | ORAL | Status: AC
  Administered 2018-12-16: 20 mg via ORAL
  Filled 2018-12-16: qty 1

## 2018-12-16 NOTE — Progress Notes (Signed)
[facility] MD Progress Note  12/16/2018 11:14 AM Melanie [name]  MRN:  [id number] Subjective: Patient is a 43 year old female with a past psychiatric history significant for major depression who presented to the emergency department on 12/12/2018 after an intentional overdose of 80 tablets of Unisom.  The patient had reported having suicidal thoughts for several days prior to admission, and that she had checked herself into a hotel that they with the purpose of committing suicide.  She was medically stabilized and then transferred to our facility.  Objective: Patient is seen and examined.  Patient is a 43 year old female with the above-stated past psychiatric history who is seen in follow-up.  She remains very distant today.  She is not a great historian.  She uses 1 word answers.  She denied any suicidal ideation today.  She stated she is requesting discharge.  She stated she lives with her son, her ex-husband, her ex-husband's parents.  She also has pets.  She seems to be more concerned about the pets.  The patient was guarded, did not discuss a great deal of things.  Review of the electronic medical record revealed that she had stated on admission that she had been previously "tract" and "followed" for years.  Her current medications include Abilify 10 mg p.o. daily and Cymbalta 40 mg p.o. daily.  She also is receiving lorazepam 0.5 mg every 6 hours as needed.  Review of her laboratories revealed a low potassium at 3.2, but otherwise all negative laboratories.  Drug screen was negative.  Her EKG on admission showed a normal sinus rhythm with a normal QTC.  Her vital signs are stable, she is afebrile.  She slept 6.75 hours last night.  Principal Problem: <principal problem not specified> Diagnosis: Active Problems:   Severe recurrent major depression without psychotic features ([place])  Total Time spent with patient: 20 minutes  Past Psychiatric History: See admission H&P  Past Medical History:  Past  Medical History:  Diagnosis Date  . Abdominal pain   . Anxiety    pt denies - noted in several MD/hosp notes   . Asthma    hx of as teenager; induced with running now   . Blood in urine   . Brain tumor ([place]) 2011  . Chills   . Clotting disorder ([place])   . Complication of anesthesia    pt states is severe   . Confusion   . Depression    noted in several past MD notes/hosp notes pt to have major depressive disorder - pt denies   . Embolism - blood clot in left ovary  . Gallstones   . Headache    rarely has but currently has one this am   . Meningioma ([place]) 08/14/2011   S/p craniotomy Encephaloma present non changing on MRI in 08/2011  . Nausea   . Palpitations   . PONV (postoperative nausea and vomiting)   . Rectal bleeding   . Rectal pain   . Seizures ([place])    last one 2011  . TIA (transient ischemic attack)   . Unintentional weight loss   . Visual disturbance   . Weakness     Past Surgical History:  Procedure Laterality Date  . BRAIN SURGERY    . brain tumor removed  2011  . CHOLECYSTECTOMY  12/03/2011   Procedure: LAPAROSCOPIC CHOLECYSTECTOMY WITH INTRAOPERATIVE CHOLANGIOGRAM;  Surgeon: Madilyn Hook, DO;  Location: [place];  Service: General;  Laterality: N/A;  . LAPAROSCOPIC OVARIAN CYSTECTOMY Bilateral 08/24/2014   Procedure: XI  ROBOTIC ASSISTED BILATERAL LAPAROSCOPIC OVARIAN CYSTECTOMY RESECTION OF ENDOMETRIAL CYST;  Surgeon: Everitt Amber, MD;  Location: [facility];  Service: Gynecology;  Laterality: Bilateral;  . ovarian mass removed  2010   left   Family History:  Family History  Problem Relation Age of Onset  . Depression Mother   . Heart failure Father   . Hypertension Father   . Heart disease Father   . Alcohol abuse Sister   . Alcohol abuse Brother   . Alzheimer's disease Maternal Grandmother   . Diabetes Maternal Grandmother   . Stroke Maternal Grandmother    Family Psychiatric  History: See admission H&P Social History:  Social History   Substance and  Sexual Activity  Alcohol Use No     Social History   Substance and Sexual Activity  Drug Use No    Social History   Socioeconomic History  . Marital status: Divorced    Spouse name: Not on file  . Number of children: Not on file  . Years of education: Not on file  . Highest education level: Not on file  Occupational History  . Not on file  Social Needs  . Financial resource strain: Patient refused  . Food insecurity    Worry: Patient refused    Inability: Patient refused  . Transportation needs    Medical: Not on file    Non-medical: Not on file  Tobacco Use  . Smoking status: Never Smoker  . Smokeless tobacco: Never Used  Substance and Sexual Activity  . Alcohol use: No  . Drug use: No  . Sexual activity: Not Currently  Lifestyle  . Physical activity    Days per week: Patient refused    Minutes per session: Patient refused  . Stress: Not on file  Relationships  . Social Herbalist on phone: Patient refused    Gets together: Patient refused    Attends religious service: Patient refused    Active member of club or organization: Patient refused    Attends meetings of clubs or organizations: Patient refused    Relationship status: Patient refused  Other Topics Concern  . Not on file  Social History Narrative  . Not on file   Additional Social History:                         Sleep: Fair  Appetite:  Fair  Current Medications: Current Facility-Administered Medications  Medication Dose Route Frequency Provider Last Rate Last Dose  . acetaminophen (TYLENOL) tablet 650 mg  650 mg Oral Q6H PRN Lindon Romp A, NP   650 mg at 12/14/18 0907  . alum & mag hydroxide-simeth (MAALOX/MYLANTA) 200-200-20 MG/5ML suspension 30 mL  30 mL Oral Q4H PRN Lindon Romp A, NP      . ARIPiprazole (ABILIFY) tablet 10 mg  10 mg Oral Daily Cobos, Myer Peer, MD   10 mg at 12/16/18 0807  . [START ON 12/17/2018] DULoxetine (CYMBALTA) DR capsule 60 mg  60 mg Oral Daily  Sharma Covert, MD      . LORazepam (ATIVAN) tablet 0.5 mg  0.5 mg Oral Q6H PRN Cobos, Myer Peer, MD      . magnesium hydroxide (MILK OF MAGNESIA) suspension 30 mL  30 mL Oral Daily PRN Rozetta Nunnery, NP        Lab Results: No results found for this or any previous visit (from the past 59 hour(s)).  Blood Alcohol level:  Lab Results  Component Value Date   [facility] <10 12/12/2018   ETH <11 [id number]    Metabolic Disorder Labs: Lab Results  Component Value Date   HGBA1C 5.1 08/27/2013   MPG 100 08/27/2013   MPG 103 08/13/2011   No results found for: PROLACTIN Lab Results  Component Value Date   CHOL 247 (H) 12/13/2018   TRIG 84 12/13/2018   HDL 53 12/13/2018   CHOLHDL 4.7 12/13/2018   VLDL 17 12/13/2018   LDLCALC 177 (H) 12/13/2018   LDLCALC 142 (H) 08/28/2013    Physical Findings: AIMS: Facial and Oral Movements Muscles of Facial Expression: None, normal Lips and Perioral Area: None, normal Jaw: None, normal Tongue: None, normal,Extremity Movements Upper (arms, wrists, hands, fingers): None, normal Lower (legs, knees, ankles, toes): None, normal, Trunk Movements Neck, shoulders, hips: None, normal, Overall Severity Severity of abnormal movements (highest score from questions above): None, normal Incapacitation due to abnormal movements: None, normal Patient's awareness of abnormal movements (rate only patient's report): No Awareness, Dental Status Current problems with teeth and/or dentures?: No Does patient usually wear dentures?: No  CIWA:    COWS:     Musculoskeletal: Strength & Muscle Tone: within normal limits Gait & Station: normal Patient leans: N/A  Psychiatric Specialty Exam: Physical Exam  Nursing note and vitals reviewed. Constitutional: She is oriented to person, place, and time. She appears well-developed and well-nourished.  HENT:  Head: Normocephalic and atraumatic.  Respiratory: Effort normal.  Neurological: She is alert and oriented to  person, place, and time.    ROS  Blood pressure 110/86, pulse 97, temperature 98.1 F (36.7 C), temperature source Oral.There is no height or weight on file to calculate BMI.  General Appearance: Casual  Eye Contact:  Fair  Speech:  Normal Rate  Volume:  Decreased  Mood:  Anxious and Dysphoric  Affect:  Congruent  Thought Process:  Coherent and Descriptions of Associations: Circumstantial  Orientation:  Full (Time, Place, and Person)  Thought Content:  Negative  Suicidal Thoughts:  No  Homicidal Thoughts:  No  Memory:  Immediate;   Poor Recent;   Poor Remote;   Poor  Judgement:  Impaired  Insight:  Lacking  Psychomotor Activity:  Increased  Concentration:  Concentration: Fair and Attention Span: Fair  Recall:  [employer] of Knowledge:  Fair  Language:  Fair  Akathisia:  Negative  Handed:  Right  AIMS (if indicated):     Assets:  Desire for Improvement Resilience  ADL's:  Intact  Cognition:  WNL  Sleep:  Number of Hours: 6.75     Treatment Plan Summary: Daily contact with patient to assess and evaluate symptoms and progress in treatment, Medication management and Plan : Patient is seen and examined.  Patient is a 43 year old female with the above-stated past psychiatric history who is seen in follow-up.   Diagnosis: #1 major depression, recurrent, severe with psychotic features, #2 unspecified psychosis  Patient is seen in follow-up.  She is really guarded and not a great historian.  She is basically just requesting discharge, but not really able to provide any history understanding of her recent suicide attempt.  I am going to increase her Cymbalta to 60 mg p.o. daily.  I will continue the Abilify 10 mg p.o. daily for her mood stability as well as psychosis.  Hopefully these will begin to kick in and provide some benefit so we can get a better rationale and some of her suicidal ideation as well as psychotic symptoms. 1.  Continue Abilify 10 mg p.o. daily for mood stability  as well as psychosis. 2.  Increase Cymbalta to 60 mg p.o. daily for mood and anxiety. 3.  Continue lorazepam 0.5 mg p.o. every 6 hours as needed anxiety. 4.  Disposition planning-in progress.  Sharma Covert, MD 12/16/2018, 11:14 AM

## 2018-12-16 NOTE — Progress Notes (Signed)
D: Pt denies SI/HI/AV hallucinations. Pt has been in room most of shift. A: Pt was offered support and encouragement. . Pt was encourage to attend group.  Q 15 minute checks were done for safety.  R:Pt did not  attend group and interacts well with staff. Pt has no complaints.Pt receptive to treatment and safety maintained on unit.

## 2018-12-16 NOTE — [facility] Group Notes (Signed)
Pt did not attend wrap up group this evening. Pt was asleep in bed.  

## 2018-12-17 MED ORDER — ARIPIPRAZOLE 15 MG PO TABS
15.0000 mg | ORAL_TABLET | Freq: Every day | ORAL | Status: DC
  Administered 2018-12-18: 15 mg via ORAL
  Filled 2018-12-17 (×3): qty 1

## 2018-12-17 NOTE — Progress Notes (Signed)
D: Pt denies SI/HI/AV hallucinations. Pt is isolative to her room most of shift. A: Pt was offered support and encouragement.  Pt was encourage to attend groups. Q 15 minute checks were done for safety.  R:Pt did not attend group and interacts with staff.  Pt has no complaints.Pt receptive to treatment and safety maintained on unit.

## 2018-12-17 NOTE — Plan of Care (Signed)
  Problem: Activity: Goal: Sleeping patterns will improve Outcome: Progressing Note: Patient observed resting in bed with eyes closed

## 2018-12-17 NOTE — Progress Notes (Signed)
[facility] MD Progress Note  12/17/2018 11:26 AM Melanie [name]  MRN:  [id number] Subjective:  Patient is a 43 year old female with a past psychiatric history significant for major depression who presented to the emergency department on 12/12/2018 after an intentional overdose of 80 tablets of Unisom.  The patient had reported having suicidal thoughts for several days prior to admission, and that she had checked herself into a hotel that they with the purpose of committing suicide.  She was medically stabilized and then transferred to our facility.  Objective: Patient is seen and examined.  Patient is a 43 year old female with the above-stated past psychiatric history who is seen in follow-up.  She is much more open today.  She is more talkative and less guarded.  Review of her chart showed that she did have some paranoia in the past.  Today she is more talking about how that her ex-husband is yelled at her about using the "shared car".  She avoids discussing similar paranoid thoughts in the past.  She denied any auditory or visual hallucinations.  She denied any suicidal or homicidal ideation.  She seems to be tolerating the Cymbalta without difficulty.  Review of her laboratories revealed no new lab work.  Her blood pressure still mildly elevated at 121/96, and her pulse was 107 this morning.  She slept 6.5 hours last night.  Principal Problem: <principal problem not specified> Diagnosis: Active Problems:   Severe recurrent major depression without psychotic features (HCC)   Severe recurrent major depression with psychotic features ([place])   Unspecified psychosis not due to a substance or known physiological condition ([place])  Total Time spent with patient: 20 minutes  Past Psychiatric History: See admission H&P  Past Medical History:  Past Medical History:  Diagnosis Date  . Abdominal pain   . Anxiety    pt denies - noted in several MD/hosp notes   . Asthma    hx of as teenager; induced with  running now   . Blood in urine   . Brain tumor ([place]) 2011  . Chills   . Clotting disorder ([place])   . Complication of anesthesia    pt states is severe   . Confusion   . Depression    noted in several past MD notes/hosp notes pt to have major depressive disorder - pt denies   . Embolism - blood clot in left ovary  . Gallstones   . Headache    rarely has but currently has one this am   . Meningioma ([place]) 08/14/2011   S/p craniotomy Encephaloma present non changing on MRI in 08/2011  . Nausea   . Palpitations   . PONV (postoperative nausea and vomiting)   . Rectal bleeding   . Rectal pain   . Seizures ([place])    last one 2011  . TIA (transient ischemic attack)   . Unintentional weight loss   . Visual disturbance   . Weakness     Past Surgical History:  Procedure Laterality Date  . BRAIN SURGERY    . brain tumor removed  2011  . CHOLECYSTECTOMY  12/03/2011   Procedure: LAPAROSCOPIC CHOLECYSTECTOMY WITH INTRAOPERATIVE CHOLANGIOGRAM;  Surgeon: Madilyn Hook, DO;  Location: [place];  Service: General;  Laterality: N/A;  . LAPAROSCOPIC OVARIAN CYSTECTOMY Bilateral 08/24/2014   Procedure: XI ROBOTIC ASSISTED BILATERAL LAPAROSCOPIC OVARIAN CYSTECTOMY RESECTION OF ENDOMETRIAL CYST;  Surgeon: Everitt Amber, MD;  Location: [facility];  Service: Gynecology;  Laterality: Bilateral;  . ovarian mass removed  2010  left   Family History:  Family History  Problem Relation Age of Onset  . Depression Mother   . Heart failure Father   . Hypertension Father   . Heart disease Father   . Alcohol abuse Sister   . Alcohol abuse Brother   . Alzheimer's disease Maternal Grandmother   . Diabetes Maternal Grandmother   . Stroke Maternal Grandmother    Family Psychiatric  History: See admission H&P Social History:  Social History   Substance and Sexual Activity  Alcohol Use No     Social History   Substance and Sexual Activity  Drug Use No    Social History   Socioeconomic History  . Marital  status: Divorced    Spouse name: Not on file  . Number of children: Not on file  . Years of education: Not on file  . Highest education level: Not on file  Occupational History  . Not on file  Social Needs  . Financial resource strain: Patient refused  . Food insecurity    Worry: Patient refused    Inability: Patient refused  . Transportation needs    Medical: Not on file    Non-medical: Not on file  Tobacco Use  . Smoking status: Never Smoker  . Smokeless tobacco: Never Used  Substance and Sexual Activity  . Alcohol use: No  . Drug use: No  . Sexual activity: Not Currently  Lifestyle  . Physical activity    Days per week: Patient refused    Minutes per session: Patient refused  . Stress: Not on file  Relationships  . Social Herbalist on phone: Patient refused    Gets together: Patient refused    Attends religious service: Patient refused    Active member of club or organization: Patient refused    Attends meetings of clubs or organizations: Patient refused    Relationship status: Patient refused  Other Topics Concern  . Not on file  Social History Narrative  . Not on file   Additional Social History:                         Sleep: Good  Appetite:  Fair  Current Medications: Current Facility-Administered Medications  Medication Dose Route Frequency Provider Last Rate Last Dose  . acetaminophen (TYLENOL) tablet 650 mg  650 mg Oral Q6H PRN Lindon Romp A, NP   650 mg at 12/14/18 0907  . alum & mag hydroxide-simeth (MAALOX/MYLANTA) 200-200-20 MG/5ML suspension 30 mL  30 mL Oral Q4H PRN Rozetta Nunnery, NP      . Derrill Memo ON 12/18/2018] ARIPiprazole (ABILIFY) tablet 15 mg  15 mg Oral Daily Sharma Covert, MD      . DULoxetine (CYMBALTA) DR capsule 60 mg  60 mg Oral Daily Sharma Covert, MD   60 mg at 12/17/18 0849  . LORazepam (ATIVAN) tablet 0.5 mg  0.5 mg Oral Q6H PRN Cobos, Myer Peer, MD      . magnesium hydroxide (MILK OF MAGNESIA)  suspension 30 mL  30 mL Oral Daily PRN Rozetta Nunnery, NP        Lab Results: No results found for this or any previous visit (from the past 33 hour(s)).  Blood Alcohol level:  Lab Results  Component Value Date   [facility] <10 12/12/2018   ETH <11 [id number]    Metabolic Disorder Labs: Lab Results  Component Value Date   HGBA1C 5.1 08/27/2013   MPG  100 08/27/2013   MPG 103 08/13/2011   No results found for: PROLACTIN Lab Results  Component Value Date   CHOL 247 (H) 12/13/2018   TRIG 84 12/13/2018   HDL 53 12/13/2018   CHOLHDL 4.7 12/13/2018   VLDL 17 12/13/2018   LDLCALC 177 (H) 12/13/2018   LDLCALC 142 (H) 08/28/2013    Physical Findings: AIMS: Facial and Oral Movements Muscles of Facial Expression: None, normal Lips and Perioral Area: None, normal Jaw: None, normal Tongue: None, normal,Extremity Movements Upper (arms, wrists, hands, fingers): None, normal Lower (legs, knees, ankles, toes): None, normal, Trunk Movements Neck, shoulders, hips: None, normal, Overall Severity Severity of abnormal movements (highest score from questions above): None, normal Incapacitation due to abnormal movements: None, normal Patient's awareness of abnormal movements (rate only patient's report): No Awareness, Dental Status Current problems with teeth and/or dentures?: No Does patient usually wear dentures?: No  CIWA:    COWS:     Musculoskeletal: Strength & Muscle Tone: within normal limits Gait & Station: normal Patient leans: N/A  Psychiatric Specialty Exam: Physical Exam  Nursing note and vitals reviewed. Constitutional: She is oriented to person, place, and time. She appears well-developed and well-nourished.  HENT:  Head: Normocephalic and atraumatic.  Respiratory: Effort normal.  Neurological: She is alert and oriented to person, place, and time.    ROS  Blood pressure (!) 121/96, pulse (!) 107, temperature 97.9 F (36.6 C), resp. rate 16.There is no height or weight on  file to calculate BMI.  General Appearance: Casual  Eye Contact:  Fair  Speech:  Normal Rate  Volume:  Normal  Mood:  Anxious  Affect:  Congruent  Thought Process:  Coherent and Descriptions of Associations: Circumstantial  Orientation:  Full (Time, Place, and Person)  Thought Content:  Paranoid Ideation  Suicidal Thoughts:  No  Homicidal Thoughts:  No  Memory:  Immediate;   Fair Recent;   Fair Remote;   Fair  Judgement:  Intact  Insight:  Fair  Psychomotor Activity:  Increased  Concentration:  Concentration: Fair and Attention Span: Fair  Recall:  [employer] of Knowledge:  Fair  Language:  Good  Akathisia:  Negative  Handed:  Right  AIMS (if indicated):     Assets:  Desire for Improvement Resilience  ADL's:  Intact  Cognition:  WNL  Sleep:  Number of Hours: 6.5     Treatment Plan Summary: Daily contact with patient to assess and evaluate symptoms and progress in treatment, Medication management and Plan : Patient is seen and examined.  Patient is a 43 year old female with the above-stated past psychiatric history who is seen in follow-up.  Diagnosis: #1 major depression, recurrent, severe with psychotic features, #2 unspecified psychosis  Patient is seen in follow-up.  From a mood standpoint she is doing much better.  She is still very guarded about her paranoid thinking.  She is tolerating the increase in the Cymbalta.  We will continue 60 mg p.o. daily.  I am going to increase her Abilify to 15 mg p.o. daily.  No other changes in her medications at this point.  She is not acutely suicidal, homicidal or psychotic.  She is asking for discharge in the next day or so, and hopefully if she continues to improve we can adhere to that. 1.  Increase Abilify to 15 mg p.o. daily for mood stability as well as psychosis. 2.  Continue Cymbalta 60 mg p.o. daily for mood and anxiety. 3.  Continue lorazepam 0.5 mg  p.o. every 6 hours as needed anxiety. 4.  Disposition planning-in  progress. Sharma Covert, MD 12/17/2018, 11:26 AM

## 2018-12-17 NOTE — Progress Notes (Signed)
Recreation Therapy Notes  Date:  10.7.20 Time: 1120 Location: 300 Hall Dayroom  Group Topic: Stress Management  Goal Area(s) Addresses:  Patient will identify positive stress management techniques. Patient will identify benefits of using stress management post d/c.  Behavioral Response:  Engaged  Intervention:  Stress Management  Activity :  Meditation.  LRT introduced the stress management technique of meditation.  LRT played a meditation that focused on embodying the characteristics of mountain and bringing it into meditation and everyday life.  Patients were to follow along as meditation played.  Education:  Stress Management, Discharge Planning.   Education Outcome: Acknowledges Education  Clinical Observations/Feedback:  Pt attended and participated in session.     Victorino Sparrow, LRT/CTRS         Victorino Sparrow A 12/17/2018 11:56 AM

## 2018-12-17 NOTE — BHH Group Notes (Signed)
[place] Group Notes:  (Nursing/MHT/Case Management/Adjunct)  Date:  12/16/2018  Time:  1:00 PM  Type of Therapy:  Nurse Education  Participation Level:  None  Participation Quality:  Attentive  Affect:  Appropriate and Depressed  Cognitive:  Alert and Appropriate  Insight:  None  Engagement in Group:  None  Modes of Intervention:  Discussion, Education, Exploration, Socialization and Support  Summary of Progress/Problems: Pt's discussed mindfulness and what this word/practice means for them. Pt's educated each other on the practice of mindfulness and how they use this as a coping skill. Pt's related mindfulness to their depression/anxiety/substance abuse. Lastly, pt's related their personal goal(s) and how using mindfulness while being mindful could aid them in reducing future crisis. Pt came to group but did not participate. Pt left early after another group member became off topic and could be redirected. Pt was depressed/sad/sullen in their affect.   Melanie [name] 12/17/2018, 10:16 AM

## 2018-12-18 MED ORDER — DULOXETINE HCL 60 MG PO CPEP: 60.0000 mg | ORAL_CAPSULE | Freq: Every day | ORAL | 0 refills | Status: DC

## 2018-12-18 MED ORDER — ARIPIPRAZOLE 15 MG PO TABS: 15.0000 mg | ORAL_TABLET | Freq: Every day | ORAL | 0 refills | Status: DC

## 2018-12-18 NOTE — BHH Group Notes (Signed)
[place] Group Notes:  (Nursing/MHT/Case Management/Adjunct)  Date:  12/17/2018  Time:  9:00 AM  Type of Therapy:  Nurse Education  Participation Level:  Minimal  Participation Quality:  Appropriate and Attentive  Affect:  Appropriate  Cognitive:  Alert and Appropriate  Insight:  Appropriate and Good  Engagement in Group:  Limited  Modes of Intervention:  Discussion, Education, Exploration, Socialization and Support  Summary of Progress/Problems: Pt's dicussed their personal development and how goal setting could/can help them achieve this. Pt's were educated on what SMART goal setting is, and how they can use this for short and long term goal setting. Pt's discussed their goal for the day and how they hoped to obtain this goal. If the goal wasn't SMART, then the pt was asked to formulate a new goal. Lastly, pt's identified needs being met and those needs that possibly aren't being met hindering them from self-actualization.  Pt was minimal in group but attentive. Pt is fixated on discharge but isn't sure how they will achieve this.   Otelia Limes Mandel Seiden 12/18/2018, 8:11 AM

## 2018-12-18 NOTE — Progress Notes (Signed)
  [facility] Adult Case Management Discharge Plan :  Will you be returning to the same living situation after discharge:  No. Patient is discharging home with her close friend.  At discharge, do you have transportation home?: Yes,  patient's close friend is picking her up  Do you have the ability to pay for your medications: Yes,  Medicare A&B  Release of information consent forms completed and in the chart;  Patient's signature needed at discharge.  Patient to Follow up at: Follow-up Information    [facility] Follow up on 01/07/2019.   Specialty: Behavioral Health Why: Medication management appointment with Dr. Adele Schilder is Wednesday, 10/28 at 1:00p.  Appt will be virtual and the office will contact you for appointment.  Contact information: [place] [phone]          Next level of care provider has access to [place] and Suicide Prevention discussed: Yes,  with the patient's close friend  Have you used any form of tobacco in the last 30 days? (Cigarettes, Smokeless Tobacco, Cigars, and/or Pipes): No  Has patient been referred to the Quitline?: N/A patient is not a smoker  Patient has been referred for addiction treatment: N/A  Marylee Floras, [place] 12/18/2018, 10:47 AM

## 2018-12-18 NOTE — BHH Group Notes (Signed)
[place] Group Notes:  (Nursing/MHT/Case Management/Adjunct)  Date:  12/18/2018  Time:  10:00 AM  Type of Therapy:  Nurse Education  Participation Level:  Did Not Attend  Summary of Progress/Problems: pt's discussed crisis management and how this related to past/current crisis they were/are facing. Pt's were guided through their suicide safety plan and how each section could aid in preventing another crisis from escalating. Pt's shared their resources to help others realize coping skills, support systems and resources that could be utilized. Pt did not attend group  Baron Sane 12/18/2018, 11:53 AM

## 2018-12-18 NOTE — Progress Notes (Signed)
Patient ID: Melanie [name], female   DOB: Apr 29, 1975, 43 y.o.   MRN: [id number] Pt d/c to home with d/c instructions, aftercare, and medications reviewed. Pt verbalizes understanding. Denies s.i.

## 2018-12-18 NOTE — BHH Suicide Risk Assessment (Signed)
[place] INPATIENT:  Family/Significant Other Suicide Prevention Education  Suicide Prevention Education:  Education Completed; with close friend, Frederic Jericho [phone]) has been identified by the patient as the family member/significant other with whom the patient will be residing, and identified as the person(s) who will aid the patient in the event of a mental health crisis (suicidal ideations/suicide attempt).  With written consent from the patient, the family member/significant other has been provided the following suicide prevention education, prior to the and/or following the discharge of the patient.  The suicide prevention education provided includes the following:  Suicide risk factors  Suicide prevention and interventions  National Suicide Hotline telephone number  [facility] assessment telephone number  [facility] Emergency Assistance [place] and/or Residential Mobile Crisis Unit telephone number  Request made of family/significant other to:  Remove weapons (e.g., guns, rifles, knives), all items previously/currently identified as safety concern.    Remove drugs/medications (over-the-counter, prescriptions, illicit drugs), all items previously/currently identified as a safety concern.  The family member/significant other verbalizes understanding of the suicide prevention education information provided.  The family member/significant other agrees to remove the items of safety concern listed above.  Melanie [name] reports that she does not have any questions or concerns regarding the patient discharging today. She reports the patient is discharging home with her and that she will pick the patient up today. CSW will continue to follow for a safe discharge.   Marylee Floras 12/18/2018, 10:35 AM

## 2018-12-18 NOTE — Discharge Summary (Signed)
Physician Discharge Summary Note  Patient:  Melanie [name] is an 43 y.o., female  MRN:  [id number]  DOB:  September 02, 1975  Patient phone:  [phone] (home)   Patient address:   [place] [address],   Total Time spent with patient: Greater than 30 minutes  Date of Admission:  12/13/2018  Date of Discharge: 12-18-18  Reason for Admission: Suicide attempt by overdose  Principal Problem: Severe recurrent major depression with psychotic features [facility])  Discharge Diagnoses: Principal Problem:   Severe recurrent major depression with psychotic features ([place]) Active Problems:   Severe recurrent major depression without psychotic features ([place])   Unspecified psychosis not due to a substance or known physiological condition [facility])  Past Psychiatric History: MDD without psychosis.  Past Medical History:  Past Medical History:  Diagnosis Date  . Abdominal pain   . Anxiety    pt denies - noted in several MD/hosp notes   . Asthma    hx of as teenager; induced with running now   . Blood in urine   . Brain tumor ([place]) 2011  . Chills   . Clotting disorder ([place])   . Complication of anesthesia    pt states is severe   . Confusion   . Depression    noted in several past MD notes/hosp notes pt to have major depressive disorder - pt denies   . Embolism - blood clot in left ovary  . Gallstones   . Headache    rarely has but currently has one this am   . Meningioma ([place]) 08/14/2011   S/p craniotomy Encephaloma present non changing on MRI in 08/2011  . Nausea   . Palpitations   . PONV (postoperative nausea and vomiting)   . Rectal bleeding   . Rectal pain   . Seizures ([place])    last one 2011  . TIA (transient ischemic attack)   . Unintentional weight loss   . Visual disturbance   . Weakness     Past Surgical History:  Procedure Laterality Date  . BRAIN SURGERY    . brain tumor removed  2011  . CHOLECYSTECTOMY  12/03/2011   Procedure: LAPAROSCOPIC  CHOLECYSTECTOMY WITH INTRAOPERATIVE CHOLANGIOGRAM;  Surgeon: Madilyn Hook, DO;  Location: [facility];  Service: General;  Laterality: N/A;  . LAPAROSCOPIC OVARIAN CYSTECTOMY Bilateral 08/24/2014   Procedure: XI ROBOTIC ASSISTED BILATERAL LAPAROSCOPIC OVARIAN CYSTECTOMY RESECTION OF ENDOMETRIAL CYST;  Surgeon: Everitt Amber, MD;  Location: [facility];  Service: Gynecology;  Laterality: Bilateral;  . ovarian mass removed  2010   left   Family History:  Family History  Problem Relation Age of Onset  . Depression Mother   . Heart failure Father   . Hypertension Father   . Heart disease Father   . Alcohol abuse Sister   . Alcohol abuse Brother   . Alzheimer's disease Maternal Grandmother   . Diabetes Maternal Grandmother   . Stroke Maternal Grandmother    Family Psychiatric  History: See H&P Social History:  Social History   Substance and Sexual Activity  Alcohol Use No     Social History   Substance and Sexual Activity  Drug Use No    Social History   Socioeconomic History  . Marital status: Divorced    Spouse name: Not on file  . Number of children: Not on file  . Years of education: Not on file  . Highest education level: Not on file  Occupational History  . Not on file  Social Needs  . Financial resource strain: Patient refused  . Food insecurity    Worry: Patient refused    Inability: Patient refused  . Transportation needs    Medical: Not on file    Non-medical: Not on file  Tobacco Use  . Smoking status: Never Smoker  . Smokeless tobacco: Never Used  Substance and Sexual Activity  . Alcohol use: No  . Drug use: No  . Sexual activity: Not Currently  Lifestyle  . Physical activity    Days per week: Patient refused    Minutes per session: Patient refused  . Stress: Not on file  Relationships  . Social Herbalist on phone: Patient refused    Gets together: Patient refused    Attends religious service: Patient refused    Active member of club or  organization: Patient refused    Attends meetings of clubs or organizations: Patient refused    Relationship status: Patient refused  Other Topics Concern  . Not on file  Social History Narrative  . Not on file   Hospital Course: (Per Md's admission evaluation): 43 year old female. Presented to ED yesterday after suicide attempt by overdosing on OTC sleeping medication (reports took 80 tablets, which she states was " the whole bottle)". She reports she had been having suicidal ideations for a few days prior to attempt and that she had checked self into hotel earlier that day with the purpose of committing suicide . After overdosing she fell asleep but upon waking states " I started thinking about my pets" and decided to contact authorities. She reports she has been " under a lot of stress" and endorses worsening depression over recent days. She states that her son's grandparents with whom she lives, have told her she has to move out within the next few days. She states she has been very worried as she does not know where else she could go to.  She also reports she has another issue that " has been stressing me really badly" but states " I can't talk about it".  Endorses neuro-vegetative symptoms as below. She presents with psychotic symptoms - presents reluctant to talk about this issue, and states " I can't really talk about it and it is hard to explain" but states that she has been " tracked " and " followed " for years. States she thinks " it may be through my phone or though my car, I don't know".   This is a discharge summary for this 43 year old female. She was brought to the hospital for evaluation & treatment after a suicide attempt by overdose. After evaluation of her presenting symptoms, the medication regimen targeting her presenting symptoms were discussed & with her consent, initiated. She received, stabilized & was discharged on the medications as listed on the discharge medication lists  below. She was also enrolled & participated in the group counseling sessions being offered & held on this unit. She learned coping skills. She presented no other significant pre-existing medical issues that required treatment & or monitoring. She tolerated her treatment regimen without any significant adverse effects or reactions reported.    Epiphany's symptoms responded well to her treatment regimen. This is evidenced by her reports of improved mood, resolution of symptoms & presentation of good affect. She is currently mentally & medically stable for discharge. Today upon her discharge evaluation with the attending psychiatrist today, pt shares, "I'm doing good. I feel much better". She denies any specific  concerns. She is sleeping well. Her appetite is good. She denies any other physical complaints. She denies SI/HI/AH/VH. She is tolerating her medications well and she is in agreement to continue her current regimen without changes. She will have follow up care for routine psychiatric care & medication management on an outpatient basis as noted below. She was able to engage in safety planning including plan to return to [facility] or contact emergency services if she feels unable to maintain her own safety or the safety of others. Pt had no further questions, comments or concerns.    She left [facility] with all personal belongings in no apparent distress. Transportation per friend.      Physical Findings: AIMS: Facial and Oral Movements Muscles of Facial Expression: None, normal Lips and Perioral Area: None, normal Jaw: None, normal Tongue: None, normal,Extremity Movements Upper (arms, wrists, hands, fingers): None, normal Lower (legs, knees, ankles, toes): None, normal, Trunk Movements Neck, shoulders, hips: None, normal, Overall Severity Severity of abnormal movements (highest score from questions above): None, normal Incapacitation due to abnormal movements: None, normal Patient's awareness of abnormal  movements (rate only patient's report): No Awareness, Dental Status Current problems with teeth and/or dentures?: No Does patient usually wear dentures?: No  CIWA:    COWS:     Musculoskeletal: Strength & Muscle Tone: within normal limits Gait & Station: normal Patient leans: N/A  Psychiatric Specialty Exam: Physical Exam  Nursing note and vitals reviewed. Constitutional: She is oriented to person, place, and time. She appears well-developed.  Neck: Normal range of motion.  Cardiovascular: Normal rate.  Respiratory: Effort normal.  Genitourinary:    Genitourinary Comments: Deferred   Musculoskeletal: Normal range of motion.  Neurological: She is alert and oriented to person, place, and time.  Skin: Skin is warm and dry.    Review of Systems  Constitutional: Negative for chills and fever.  Respiratory: Negative for cough, shortness of breath and wheezing.   Cardiovascular: Negative for chest pain and palpitations.  Gastrointestinal: Negative for heartburn, nausea and vomiting.  Neurological: Negative for dizziness and headaches.  Psychiatric/Behavioral: Positive for depression (Stabilized with medication prior to discharge). Negative for hallucinations, memory loss, substance abuse and suicidal ideas. The patient has insomnia (Stailized with medication prior to discharge). The patient is not nervous/anxious (Stable ).     Blood pressure 111/84, pulse 89, temperature 98.6 F (37 C), resp. rate 16, SpO2 99 %.There is no height or weight on file to calculate BMI.  See Md's discharge SRA   Have you used any form of tobacco in the last 30 days? (Cigarettes, Smokeless Tobacco, Cigars, and/or Pipes): No  Has this patient used any form of tobacco in the last 30 days? (Cigarettes, Smokeless Tobacco, Cigars, and/or Pipes): N/A  Blood Alcohol level:  Lab Results  Component Value Date   [facility] <10 12/12/2018   ETH <11 [id number]   Metabolic Disorder Labs:  Lab Results  Component Value  Date   HGBA1C 5.1 08/27/2013   MPG 100 08/27/2013   MPG 103 08/13/2011   No results found for: PROLACTIN Lab Results  Component Value Date   CHOL 247 (H) 12/13/2018   TRIG 84 12/13/2018   HDL 53 12/13/2018   CHOLHDL 4.7 12/13/2018   VLDL 17 12/13/2018   LDLCALC 177 (H) 12/13/2018   LDLCALC 142 (H) 08/28/2013   See Psychiatric Specialty Exam and Suicide Risk Assessment completed by Attending Physician prior to discharge.  Discharge destination:  Home  Is patient on multiple antipsychotic therapies  at discharge:  No   Has Patient had three or more failed trials of antipsychotic monotherapy by history:  No  Recommended Plan for Multiple Antipsychotic Therapies: NA  Allergies as of 12/18/2018      Reactions   Morphine And Related Nausea And Vomiting      Medication List    STOP taking these medications   aspirin 81 MG chewable tablet   benzonatate 100 MG capsule Commonly known as: TESSALON   guaiFENesin 100 MG/5ML liquid Commonly known as: ROBITUSSIN   ibuprofen 400 MG tablet Commonly known as: Advil   meclizine 25 MG tablet Commonly known as: ANTIVERT   multivitamin with minerals Tabs tablet   ondansetron 4 MG disintegrating tablet Commonly known as: Zofran ODT     TAKE these medications     Indication  ARIPiprazole 15 MG tablet Commonly known as: ABILIFY Take 1 tablet (15 mg total) by mouth daily. For mood control Start taking on: December 19, 2018  Indication: Mood control   DULoxetine 60 MG capsule Commonly known as: CYMBALTA Take 1 capsule (60 mg total) by mouth daily. For depression Start taking on: December 19, 2018  Indication: Major Depressive Disorder      Follow-up [facility] Follow up on 01/07/2019.   Specialty: Behavioral Health Why: Medication management appointment with Dr. Adele Schilder is Wednesday, 10/28 at 1:00p.  Appt will be virtual and the office will contact you for appointment.   Contact information: [place] [phone]         Follow-up recommendations: Activity:  As tolerated Diet: As recommended by your primary care doctor. Keep all scheduled follow-up appointments as recommended.    Comments: Prescriptions given at discharge.  Patient agreeable to plan.  Given opportunity to ask questions.  Appears to feel comfortable with discharge denies any current suicidal or homicidal thought. Patient is also instructed prior to discharge to: Take all medications as prescribed by his/her mental healthcare provider. Report any adverse effects and or reactions from the medicines to his/her outpatient provider promptly. Patient has been instructed & cautioned: To not engage in alcohol and or illegal drug use while on prescription medicines. In the event of worsening symptoms, patient is instructed to call the crisis hotline, 911 and or go to the nearest ED for appropriate evaluation and treatment of symptoms. To follow-up with his/her primary care provider for your other medical issues, concerns and or health care needs.  Signed: Lindell Spar, NP, PMHNP, FNP-BC 12/18/2018, 10:05 AM

## 2018-12-18 NOTE — Progress Notes (Signed)
DAR NOTE: Pt present with flat affect and depressed mood in the unit. Pt has been isolating herself and has been bed most of the time. Pt denies physical pain. Pt's safety ensured with 15 minute and environmental checks. Pt currently denies SI/HI and A/V hallucinations. Pt verbally agrees to seek staff if SI/HI or A/VH occurs and to consult with staff before acting on these thoughts. Will continue POC.

## 2018-12-18 NOTE — BHH Suicide Risk Assessment (Signed)
[facility] Discharge Suicide Risk Assessment   Principal Problem: <principal problem not specified> Discharge Diagnoses: Active Problems:   Severe recurrent major depression without psychotic features ([facility])   Severe recurrent major depression with psychotic features ([place])   Unspecified psychosis not due to a substance or known physiological condition ([place])   Total Time spent with patient: 20 minutes  Musculoskeletal: Strength & Muscle Tone: within normal limits Gait & Station: normal Patient leans: N/A  Psychiatric Specialty Exam: Review of Systems  All other systems reviewed and are negative.   Blood pressure 111/84, pulse 89, temperature 97.9 F (36.6 C), resp. rate 16, SpO2 99 %.There is no height or weight on file to calculate BMI.  General Appearance: Casual  Eye Contact::  Fair  Speech:  Normal Rate409  Volume:  Normal  Mood:  Euthymic  Affect:  Congruent  Thought Process:  Coherent and Descriptions of Associations: Intact  Orientation:  Full (Time, Place, and Person)  Thought Content:  Logical  Suicidal Thoughts:  No  Homicidal Thoughts:  No  Memory:  Immediate;   Fair Recent;   Fair Remote;   Fair  Judgement:  Intact  Insight:  Fair  Psychomotor Activity:  Normal  Concentration:  Fair  Recall:  [employer] of Knowledge:Good  Language: Good  Akathisia:  Negative  Handed:  Right  AIMS (if indicated):     Assets:  Desire for Improvement Housing Resilience  Sleep:  Number of Hours: 6  Cognition: WNL  ADL's:  Intact   Mental Status Per Nursing Assessment::   On Admission:  Intention to act on suicide plan, Belief that plan would result in death  Demographic Factors:  Divorced or widowed, Caucasian, Low socioeconomic status and Unemployed  Loss Factors: Financial problems/change in socioeconomic status  Historical Factors: Impulsivity  Risk Reduction Factors:   Living with another person, especially a relative  Continued Clinical Symptoms:  Depression:    Impulsivity More than one psychiatric diagnosis  Cognitive Features That Contribute To Risk:  Thought constriction (tunnel vision)    Suicide Risk:  Minimal: No identifiable suicidal ideation.  Patients presenting with no risk factors but with morbid ruminations; may be classified as minimal risk based on the severity of the depressive symptoms  Follow-up [facility] Follow up on 01/07/2019.   Specialty: Behavioral Health Why: Medication management appointment with Dr. Adele Schilder is Wednesday, 10/28 at 1:00p.  Appt will be virtual and the office will contact you for appointment.  Contact information: [place] [phone]          Plan Of Care/Follow-up recommendations:  Activity:  ad lib  Sharma Covert, MD 12/18/2018, 8:06 AM

## 2019-01-07 ENCOUNTER — Encounter (HOSPITAL_COMMUNITY): Payer: Medicare Other | Admitting: Psychiatry

## 2019-01-07 ENCOUNTER — Other Ambulatory Visit: Payer: Self-pay

## 2019-01-07 NOTE — Progress Notes (Signed)
No show

## 2019-08-26 ENCOUNTER — Other Ambulatory Visit: Payer: Self-pay

## 2019-08-26 ENCOUNTER — Ambulatory Visit (INDEPENDENT_AMBULATORY_CARE_PROVIDER_SITE_OTHER): Payer: Medicare Other | Admitting: Podiatrist

## 2019-08-26 DIAGNOSIS — B351 Tinea unguium: Secondary | ICD-10-CM | POA: Diagnosis not present

## 2019-08-26 DIAGNOSIS — L601 Onycholysis: Secondary | ICD-10-CM | POA: Diagnosis not present

## 2019-08-26 DIAGNOSIS — L603 Nail dystrophy: Secondary | ICD-10-CM | POA: Diagnosis not present

## 2019-08-26 NOTE — Progress Notes (Signed)
  Chief Complaint  Patient presents with  . Nail Problem    Right 1st and 2nd nail dark discoloration, 1 yr duration, non-painful, pt denies drainage. Pt states concern she has an infection and states "I don't feel good, I have blurry vision and nausea when eating for 1 month"     HPI: Patient is 44 y.o. female who presents today for the concerns as listed above.    Review of Systems No fevers, chills, nausea, muscle aches, no difficulty breathing, no calf pain, no chest pain or shortness of breath.   Physical Exam  GENERAL APPEARANCE: Alert, conversant. Appropriately groomed. No acute distress.   VASCULAR: Pedal pulses palpable DP and PT bilateral.  Capillary refill time is immediate to all digits,  Proximal to distal cooling it warm to warm.  Digital hair growth is present bilateral   NEUROLOGIC: sensation is intact epicritically and protectively to 5.07 monofilament at 5/5 sites bilateral.  Light touch is intact bilateral, vibratory sensation intact bilateral, achilles tendon reflex is intact bilateral.   MUSCULOSKELETAL: acceptable muscle strength, tone and stability bilateral.  No gross boney pedal deformities noted.  No pain, crepitus or limitation noted with foot and ankle range of motion bilateral.   DERMATOLOGIC: skin is warm, supple, and dry.  No open lesions noted.  No rash, no pre ulcerative lesions. Digital nails right 1 and 2 are discolored, dystrophic, yellowing with a whitish discoloration which appears to be fungal in nature.      Assessment     ICD-10-CM   1. Fungal toenail infection  B35.1 Culture, fungus without smear     Plan  Recommended sending a piece of nail for fungal culture to decide on treatment options going forward.  A sample of the distal portion of the hallux nail was taken and sent to [facility] in a specimen bag and labeled.  We will call with the result of the culture as soon as it becomes available and decide on the best treatment based on the  culture results.

## 2019-08-26 NOTE — Patient Instructions (Signed)
Fungal Nail Infection A fungal nail infection is a common infection of the toenails or fingernails. This condition affects toenails more often than fingernails. It often affects the great, or big, toes. More than one nail may be infected. The condition can be passed from person to person (is contagious). What are the causes? This condition is caused by a fungus. Several types of fungi can cause the infection. These fungi are common in moist and warm areas. If your hands or feet come into contact with the fungus, it may get into a crack in your fingernail or toenail and cause the infection. What increases the risk? The following factors may make you more likely to develop this condition:  Being female.  Being of older age.  Living with someone who has the fungus.  Walking barefoot in areas where the fungus thrives, such as showers or locker rooms.  Wearing shoes and socks that cause your feet to sweat.  Having a nail injury or a recent nail surgery.  Having certain medical conditions, such as: ? Athlete's foot. ? Diabetes. ? Psoriasis. ? Poor circulation. ? A weak body defense system (immune system). What are the signs or symptoms? Symptoms of this condition include:  A pale spot on the nail.  Thickening of the nail.  A nail that becomes yellow or brown.  A brittle or ragged nail edge.  A crumbling nail.  A nail that has lifted away from the nail bed. How is this diagnosed? This condition is diagnosed with a physical exam. Your health care provider may take a scraping or clipping from your nail to test for the fungus. How is this treated? Treatment is not needed for mild infections. If you have significant nail changes, treatment may include:  Antifungal medicines taken by mouth (orally). You may need to take the medicine for several weeks or several months, and you may not see the results for a long time. These medicines can cause side effects. Ask your health care provider  what problems to watch for.  Antifungal nail polish or nail cream. These may be used along with oral antifungal medicines.  Laser treatment of the nail.  Surgery to remove the nail. This may be needed for the most severe infections. It can take a long time, usually up to a year, for the infection to go away. The infection may also come back. Follow these instructions at home: Medicines  Take or apply over-the-counter and prescription medicines only as told by your health care provider.  Ask your health care provider about using over-the-counter mentholated ointment on your nails. Nail care  Trim your nails often.  Wash and dry your hands and feet every day.  Keep your feet dry: ? Wear absorbent socks, and change your socks frequently. ? Wear shoes that allow air to circulate, such as sandals or canvas tennis shoes. Throw out old shoes.  Do not use artificial nails.  If you go to a nail salon, make sure you choose one that uses clean instruments.  Use antifungal foot powder on your feet and in your shoes. General instructions  Do not share personal items, such as towels or nail clippers.  Do not walk barefoot in shower rooms or locker rooms.  Wear rubber gloves if you are working with your hands in wet areas.  Keep all follow-up visits as told by your health care provider. This is important. Contact a health care provider if: Your infection is not getting better or it is getting worse   after several months. Summary  A fungal nail infection is a common infection of the toenails or fingernails.  Treatment is not needed for mild infections. If you have significant nail changes, treatment may include taking medicine orally and applying medicine to your nails.  It can take a long time, usually up to a year, for the infection to go away. The infection may also come back.  Take or apply over-the-counter and prescription medicines only as told by your health care  provider.  Follow instructions for taking care of your nails to help prevent infection from coming back or spreading. This information is not intended to replace advice given to you by your health care provider. Make sure you discuss any questions you have with your health care provider. Document Revised: 06/19/2018 Document Reviewed: 08/02/2017 Elsevier Patient Education  2020 Elsevier Inc.  

## 2019-08-31 ENCOUNTER — Encounter: Payer: Self-pay | Admitting: Podiatrist

## 2019-10-19 ENCOUNTER — Telehealth: Payer: Self-pay | Admitting: Podiatrist

## 2019-10-19 NOTE — Telephone Encounter (Signed)
Pt was seen in office 08/26/2019 for a fungal infection and had a sample sent off for a culture test but has not heard back regarding the results. Pt calling to follow up.   Please give patient a call.

## 2019-10-21 ENCOUNTER — Telehealth: Payer: Self-pay | Admitting: Podiatrist

## 2019-10-21 NOTE — Telephone Encounter (Signed)
Pt called today trying to get an update on her fungus cultures

## 2019-10-22 ENCOUNTER — Telehealth: Payer: Self-pay | Admitting: *Deleted

## 2019-10-22 MED ORDER — TERBINAFINE HCL 250 MG PO TABS: 250.0000 mg | ORAL_TABLET | Freq: Every day | ORAL | 0 refills | Status: DC

## 2019-10-22 NOTE — Telephone Encounter (Signed)
-----   Message from Bronson Ing, DPM sent at 10/21/2019  5:52 PM EDT ----- Regarding: FW: follow up for nail fungus I sent this to Estill Bamberg to schedule her an appointment but I suppose she never received it?  Did I sent it to the wrong person?  Her nail was positive for fungus-  Lamisil recommended- her last labs were fine so if she wants, I can go ahead and call her in 30 days and she can be seen in a month for follow up and repeat labs.  Thanks!!! Dr. Johnette Abraham  ----- Message ----- From: Bronson Ing, DPM Sent: 09/21/2019  12:57 PM EDT To: Sheppard Evens Subject: follow up for nail fungus                      Hi Estill Bamberg-  Could you call and set up an appointment for this patient to come in for her fungal culture results and treatment options?  Thanks so much!  Dr. Valentina Lucks

## 2019-10-22 NOTE — Telephone Encounter (Signed)
-----   Message from Bronson Ing, DPM sent at 10/22/2019  8:36 AM EDT ----- Regarding: Just 1 more thing Hi Wyman Meschke!    If you speak to Doshia and she wants to go ahead with lamisil can you advise her to avoid alcohol and medications with Tylenol/ acetaminophen while she is taking the lamisil -   Thanks again!  Dr Johnette Abraham

## 2019-10-22 NOTE — Telephone Encounter (Signed)
I informed pt of Dr. Shaune Pollack instruction and medication warnings, pt is to call to schedule follow up in 30 days.

## 2019-11-23 ENCOUNTER — Ambulatory Visit: Payer: Medicare Other | Admitting: Podiatrist

## 2019-12-01 ENCOUNTER — Ambulatory Visit (INDEPENDENT_AMBULATORY_CARE_PROVIDER_SITE_OTHER): Payer: Medicare Other | Admitting: Podiatry

## 2019-12-01 ENCOUNTER — Other Ambulatory Visit: Payer: Self-pay

## 2019-12-01 DIAGNOSIS — B351 Tinea unguium: Secondary | ICD-10-CM

## 2019-12-01 MED ORDER — TERBINAFINE HCL 250 MG PO TABS: 250.0000 mg | ORAL_TABLET | Freq: Every day | ORAL | 0 refills | Status: DC

## 2019-12-02 NOTE — Progress Notes (Signed)
  Subjective:  Patient ID: Melanie [name], female    DOB: 08-11-1975,  MRN: [id number]  Chief Complaint  Patient presents with  . Follow-up    right foot fungus takig lamisil as prescribed not getting any better    44 y.o. female presents with the above complaint. History confirmed with patient.  She took a 1 month course of Lamisil.  She has nail polish on today but says it is still discolored  Objective:  Physical Exam: warm, good capillary refill, no trophic changes or ulcerative lesions, normal DP and PT pulses and normal sensory exam.  Unable to evaluate toenails due to nail polish  Assessment:   1. Onychomycosis      Plan:  Patient was evaluated and treated and all questions answered.   Discussed the etiology and treatment options for the condition in detail with the patient. Educated patient on the topical and oral treatment options for mycotic nails.  Recommended she try another full 54-month course of Lamisil.  Recommend she takes photos when she takes her nail polish off.  We also discussed the use of laser therapy if this does not work for her.  Return in about 4 months (around 04/01/2020).

## 2020-03-06 ENCOUNTER — Emergency Department (EMERGENCY_DEPARTMENT_HOSPITAL)
Admission: EM | Admit: 2020-03-06 | Discharge: 2020-03-06 | Disposition: A | Discharge status: Other Acute Inpatient Hospital | Payer: Medicare Other | Source: Home / Self Care | Attending: Emergency Medicine | Admitting: Emergency Medicine

## 2020-03-06 ENCOUNTER — Ambulatory Visit (HOSPITAL_COMMUNITY)
Admission: AD | Admit: 2020-03-06 | Discharge: 2020-03-07 | Disposition: A | Discharge status: Home / Self Care | Payer: Medicare Other | Source: Intra-hospital | Attending: Psychiatry | Admitting: Psychiatry

## 2020-03-06 ENCOUNTER — Other Ambulatory Visit: Payer: Self-pay

## 2020-03-06 ENCOUNTER — Encounter (HOSPITAL_COMMUNITY): Payer: Self-pay | Admitting: Emergency Medicine

## 2020-03-06 ENCOUNTER — Emergency Department (HOSPITAL_COMMUNITY)
Admission: EM | Admit: 2020-03-06 | Discharge: 2020-03-06 | Disposition: A | Discharge status: Home / Self Care | Payer: Medicare Other | Attending: Emergency Medicine | Admitting: Emergency Medicine

## 2020-03-06 DIAGNOSIS — Z72820 Sleep deprivation: Secondary | ICD-10-CM | POA: Diagnosis not present

## 2020-03-06 DIAGNOSIS — Z20822 Contact with and (suspected) exposure to covid-19: Secondary | ICD-10-CM | POA: Insufficient documentation

## 2020-03-06 DIAGNOSIS — Z8673 Personal history of transient ischemic attack (TIA), and cerebral infarction without residual deficits: Secondary | ICD-10-CM | POA: Insufficient documentation

## 2020-03-06 DIAGNOSIS — F332 Major depressive disorder, recurrent severe without psychotic features: Secondary | ICD-10-CM

## 2020-03-06 DIAGNOSIS — R4020 Unspecified coma: Secondary | ICD-10-CM | POA: Diagnosis not present

## 2020-03-06 DIAGNOSIS — Z5902 Unsheltered homelessness: Secondary | ICD-10-CM | POA: Insufficient documentation

## 2020-03-06 DIAGNOSIS — R45851 Suicidal ideations: Secondary | ICD-10-CM | POA: Insufficient documentation

## 2020-03-06 DIAGNOSIS — J45909 Unspecified asthma, uncomplicated: Secondary | ICD-10-CM | POA: Insufficient documentation

## 2020-03-06 DIAGNOSIS — Z5900 Homelessness unspecified: Secondary | ICD-10-CM

## 2020-03-06 DIAGNOSIS — R404 Transient alteration of awareness: Secondary | ICD-10-CM | POA: Diagnosis not present

## 2020-03-06 LAB — URINALYSIS, ROUTINE W REFLEX MICROSCOPIC
Bacteria, UA: NONE SEEN
Bilirubin Urine: NEGATIVE
Glucose, UA: NEGATIVE mg/dL
Hgb urine dipstick: NEGATIVE
Ketones, ur: 20 mg/dL — AB
Leukocytes,Ua: NEGATIVE
Nitrite: NEGATIVE
Protein, ur: 30 mg/dL — AB
Specific Gravity, Urine: 1.031 — ABNORMAL HIGH (ref 1.005–1.030)
pH: 5 (ref 5.0–8.0)

## 2020-03-06 LAB — COMPREHENSIVE METABOLIC PANEL
ALT: 10 U/L (ref 0–44)
AST: 13 U/L — ABNORMAL LOW (ref 15–41)
Albumin: 4.7 g/dL (ref 3.5–5.0)
Alkaline Phosphatase: 58 U/L (ref 38–126)
Anion gap: 13 (ref 5–15)
BUN: 16 mg/dL (ref 6–20)
CO2: 23 mmol/L (ref 22–32)
Calcium: 9.5 mg/dL (ref 8.9–10.3)
Chloride: 102 mmol/L (ref 98–111)
Creatinine, Ser: 0.75 mg/dL (ref 0.44–1.00)
GFR, Estimated: >60 mL/min (ref >60)
Glucose, Bld: 98 mg/dL (ref 70–99)
Potassium: 3.5 mmol/L (ref 3.5–5.1)
Sodium: 138 mmol/L (ref 135–145)
Total Bilirubin: 0.8 mg/dL (ref 0.3–1.2)
Total Protein: 8.2 g/dL — ABNORMAL HIGH (ref 6.5–8.1)

## 2020-03-06 LAB — CBC
HCT: 46.7 % — ABNORMAL HIGH (ref 36.0–46.0)
Hemoglobin: 15.6 g/dL — ABNORMAL HIGH (ref 12.0–15.0)
MCH: 30.4 pg (ref 26.0–34.0)
MCHC: 33.4 g/dL (ref 30.0–36.0)
MCV: 90.9 fL (ref 80.0–100.0)
Platelets: 280 10*3/uL (ref 150–400)
RBC: 5.14 MIL/uL — ABNORMAL HIGH (ref 3.87–5.11)
RDW: 12.4 % (ref 11.5–15.5)
WBC: 7.9 10*3/uL (ref 4.0–10.5)
nRBC: 0 % (ref 0.0–0.2)

## 2020-03-06 LAB — I-STAT BETA HCG BLOOD, ED (MC, WL, AP ONLY): I-stat hCG, quantitative: <5 m[IU]/mL (ref <5)

## 2020-03-06 LAB — RESP PANEL BY RT-PCR (FLU A&B, COVID) ARPGX2
Influenza A by PCR: NEGATIVE
Influenza B by PCR: NEGATIVE
SARS Coronavirus 2 by RT PCR: NEGATIVE

## 2020-03-06 MED ORDER — SERTRALINE HCL 25 MG PO TABS
25.0000 mg | ORAL_TABLET | Freq: Once | ORAL | Status: AC
  Administered 2020-03-06: 16:00:00 25 mg via ORAL
  Filled 2020-03-06: qty 1

## 2020-03-06 MED ORDER — ACETAMINOPHEN 325 MG PO TABS: 650.0000 mg | ORAL_TABLET | Freq: Four times a day (QID) | ORAL | Status: DC | PRN

## 2020-03-06 MED ORDER — TRAZODONE HCL 50 MG PO TABS: 50.0000 mg | ORAL_TABLET | Freq: Every evening | ORAL | Status: DC | PRN

## 2020-03-06 MED ORDER — MAGNESIUM HYDROXIDE 400 MG/5ML PO SUSP: 30.0000 mL | Freq: Every day | ORAL | Status: DC | PRN

## 2020-03-06 MED ORDER — SERTRALINE HCL 20 MG/ML PO CONC: 25.0000 mg | Freq: Once | ORAL | Status: DC

## 2020-03-06 MED ORDER — ALUM & MAG HYDROXIDE-SIMETH 200-200-20 MG/5ML PO SUSP: 30.0000 mL | ORAL | Status: DC | PRN

## 2020-03-06 NOTE — ED Provider Notes (Signed)
[facility] DEPT Provider Note   CSN: [id number] Arrival date & time: 03/06/20  0630     History Chief Complaint  Patient presents with  . Suicidal    Melanie [name] is a 44 y.o. female.  The history is provided by the patient.  Mental Health Problem Presenting symptoms: suicidal thoughts and suicidal threats   Degree of incapacity (severity):  Mild Onset quality:  Gradual Timing:  Constant Progression:  Unchanged Chronicity:  New Treatment compliance:  All of the time Relieved by:  Nothing Associated symptoms: feelings of worthlessness and poor judgment   Associated symptoms: no abdominal pain and no chest pain   Risk factors: hx of mental illness (depression)        Past Medical History:  Diagnosis Date  . Abdominal pain   . Anxiety    pt denies - noted in several MD/hosp notes   . Asthma    hx of as teenager; induced with running now   . Blood in urine   . Brain tumor ([place]) 2011  . Chills   . Clotting disorder ([place])   . Complication of anesthesia    pt states is severe   . Confusion   . Depression    noted in several past MD notes/hosp notes pt to have major depressive disorder - pt denies   . Embolism - blood clot in left ovary  . Gallstones   . Headache    rarely has but currently has one this am   . Meningioma ([place]) 08/14/2011   S/p craniotomy Encephaloma present non changing on MRI in 08/2011  . Nausea   . Palpitations   . PONV (postoperative nausea and vomiting)   . Rectal bleeding   . Rectal pain   . Seizures ([place])    last one 2011  . TIA (transient ischemic attack)   . Unintentional weight loss   . Visual disturbance   . Weakness     Patient Active Problem List   Diagnosis Date Noted  . Severe recurrent major depression with psychotic features ([place])   . Unspecified psychosis not due to a substance or known physiological condition ([place])   . Severe recurrent major depression without psychotic features  ([place]) 12/13/2018  . S/P laparoscopic surgery 08/24/2014  . Endometriosis of ovary 08/24/2014  . Endometriosis   . Syncope 06/25/2014  . TIA (transient ischemic attack) 08/27/2013  . Pre-syncope 01/30/2012  . Major depressive disorder 11/05/2011  . Dizziness 08/15/2011  . Insomnia 08/15/2011  . Meningioma ([place]) 08/14/2011    Past Surgical History:  Procedure Laterality Date  . BRAIN SURGERY    . brain tumor removed  2011  . CHOLECYSTECTOMY  12/03/2011   Procedure: LAPAROSCOPIC CHOLECYSTECTOMY WITH INTRAOPERATIVE CHOLANGIOGRAM;  Surgeon: Madilyn Hook, DO;  Location: [facility];  Service: General;  Laterality: N/A;  . LAPAROSCOPIC OVARIAN CYSTECTOMY Bilateral 08/24/2014   Procedure: XI ROBOTIC ASSISTED BILATERAL LAPAROSCOPIC OVARIAN CYSTECTOMY RESECTION OF ENDOMETRIAL CYST;  Surgeon: Everitt Amber, MD;  [facility];  Service: Gynecology;  Laterality: Bilateral;  . ovarian mass removed  2010   left     OB History   No obstetric history on file.     Family History  Problem Relation Age of Onset  . Depression Mother   . Heart failure Father   . Hypertension Father   . Heart disease Father   . Alcohol abuse Sister   . Alcohol abuse Brother   . Alzheimer's disease Maternal Grandmother   .  Diabetes Maternal Grandmother   . Stroke Maternal Grandmother     Social History   Tobacco Use  . Smoking status: Never Smoker  . Smokeless tobacco: Never Used  Substance Use Topics  . Alcohol use: No  . Drug use: No    Home Medications Prior to Admission medications   Medication Sig Start Date End Date Taking? Authorizing Provider  ARIPiprazole (ABILIFY) 15 MG tablet Take 1 tablet (15 mg total) by mouth daily. For mood control 12/19/18   Lindell Spar I, NP  DULoxetine (CYMBALTA) 60 MG capsule Take 1 capsule (60 mg total) by mouth daily. For depression 12/19/18   Lindell Spar I, NP  terbinafine (LAMISIL) 250 MG tablet Take 1 tablet (250 mg total) by mouth daily. 12/01/19   Criselda Peaches,  DPM    Allergies    Morphine and related  Review of Systems   Review of Systems  Constitutional: Negative for chills and fever.  HENT: Negative for ear pain and sore throat.   Eyes: Negative for pain and visual disturbance.  Respiratory: Negative for cough and shortness of breath.   Cardiovascular: Negative for chest pain and palpitations.  Gastrointestinal: Negative for abdominal pain and vomiting.  Genitourinary: Negative for dysuria and hematuria.  Musculoskeletal: Negative for arthralgias and back pain.  Skin: Negative for color change and rash.  Neurological: Negative for seizures and syncope.  Psychiatric/Behavioral: Positive for suicidal ideas.  All other systems reviewed and are negative.   Physical Exam Updated Vital Signs BP 125/72 (BP Location: Right Arm)   Temp 97.9 F (36.6 C) (Oral)   Resp 16   Ht 5\' 2"  (1.575 m)   Wt 56.7 kg   SpO2 97%   BMI 22.86 kg/m   Physical Exam Vitals and nursing note reviewed.  Constitutional:      General: She is not in acute distress.    Appearance: She is well-developed and well-nourished.  HENT:     Head: Normocephalic and atraumatic.     Nose: Nose normal.     Mouth/Throat:     Mouth: Mucous membranes are moist.  Eyes:     Extraocular Movements: Extraocular movements intact.     Conjunctiva/sclera: Conjunctivae normal.     Pupils: Pupils are equal, round, and reactive to light.  Cardiovascular:     Rate and Rhythm: Normal rate and regular rhythm.     Pulses: Normal pulses.     Heart sounds: Normal heart sounds. No murmur heard.   Pulmonary:     Effort: Pulmonary effort is normal. No respiratory distress.     Breath sounds: Normal breath sounds.  Abdominal:     Palpations: Abdomen is soft.     Tenderness: There is no abdominal tenderness.  Musculoskeletal:        General: No edema.     Cervical back: Neck supple.  Skin:    General: Skin is warm and dry.  Neurological:     Mental Status: She is alert.   Psychiatric:        Attention and Perception: Attention normal.        Mood and Affect: Mood is depressed.        Thought Content: Thought content includes suicidal ideation. Thought content does not include homicidal ideation. Thought content includes suicidal plan. Thought content does not include homicidal plan.     ED Results / Procedures / Treatments   Labs (all labs ordered are listed, but only abnormal results are displayed) Labs Reviewed  COMPREHENSIVE METABOLIC  PANEL - Abnormal; Notable for the following components:      Result Value   Total Protein 8.2 (*)    AST 13 (*)    All other components within normal limits  CBC - Abnormal; Notable for the following components:   RBC 5.14 (*)    Hemoglobin 15.6 (*)    HCT 46.7 (*)    All other components within normal limits  ETHANOL  SALICYLATE LEVEL  ACETAMINOPHEN LEVEL  RAPID URINE DRUG SCREEN, HOSP PERFORMED  I-STAT BETA HCG BLOOD, ED ([facility], [facility], [facility] ONLY)    EKG None  Radiology No results found.  Procedures Procedures (including critical care time)  Medications Ordered in ED Medications - No data to display  ED Course  I have reviewed the triage vital signs and the nursing notes.  Pertinent labs & imaging results that were available during my care of the patient were reviewed by me and considered in my medical decision making (see chart for details).    MDM Rules/Calculators/A&P                          Melanie [name] is a 44 year old female with history of depression who presents the ED with suicidal thoughts.  Normal vitals.  No fever.  Endorses suicidal ideation with intent to overdose on her pills.  Was seen earlier in the night after found sleeping in her car.  As she is being discharged and went out to the lobby she checked back in for feeling suicidal.  She does not state any specific stressors.  She is voluntarily here for help.  She has been admitted in the past to psychiatric hospitals.  We will get  medical screening labs and have her evaluated by psychiatry.  She appears clinically sober at this time  Medically cleared.  Lab work unremarkable.  Awaiting psych recs.  This chart was dictated using voice recognition software.  Despite best efforts to proofread,  errors can occur which can change the documentation meaning.   Final Clinical Impression(s) / ED Diagnoses Final diagnoses:  Suicidal ideation    Rx / DC Orders ED Discharge Orders    None       Lennice Sites, DO 03/06/20 0940

## 2020-03-06 NOTE — ED Provider Notes (Signed)
Behavioral Health Admission H&P [facility] & OBS)  Date: 03/06/20 Patient Name: Melanie [name] MRN: [id number] Chief Complaint: No chief complaint on file.     Diagnoses:  Final diagnoses:  None     HPI: Per admission assessment note:, Pt was found sleeping in her car by police.  It took several minutes to rouse her, and when roused, Pt was confused.  She was transported to [facility] for evaluation.  As she was being discharged, Pt endorsed suicidal ideation.  Pt reported that she has been homeless for six months, and as a result, she feels very depressed.  Pt stated that she has felt suicidal (no plan or intent) for about a week, and she also endorsed three past suicide attempts.  In addition to suicidal ideation, Pt endorsed despondency, poor sleep (resulting in today's hypersomnia), poor appetite, hopelessness and worthlessness, and low energy.  Pt denied hallucination, homicidal ideation, self-injurious behavior, and substance use concerns.  Pt reported that she was raised by foster parents.  Evaluation: Melanie [name] was observed sitting in triage. She is awake, alert andoriented x3. Patient presents disheveled.  She continues to endorse suicidal ideation denied plan or intent.  Stated " I need help with my mental health."  Discussed initiating antidepressant.  Patient to be observed overnight.  Support, encouragement and reassurance was provided.  PHQ 2-9:  [facility] ED from 03/06/2020 in [facility] DEPT  Thoughts that you would be better off dead, or of hurting yourself in some way More than half the days  PHQ-9 Total Score 14      Flowsheet Row ED from 03/06/2020 in [facility] DEPT Admission (Discharged) from 12/13/2018 in [facility] ED from 12/12/2018 in [facility] DEPT  C-SSRS RISK CATEGORY High Risk High Risk High Risk       Total Time spent with patient: 15  minutes  Musculoskeletal  Strength & Muscle Tone: within normal limits Gait & Station: normal Patient leans: N/A  Psychiatric Specialty Exam  Presentation General Appearance: Appropriate for Environment  Eye Contact:Minimal  Speech:Clear and Coherent  Speech Volume:Normal  Handedness:Right   Mood and Affect  Mood:Anxious; Depressed  Affect:Depressed   Thought Process  Thought Processes:Coherent  Descriptions of Associations:Intact  Orientation:Full (Time, Place and Person)  Thought Content:Logical  Hallucinations:Hallucinations: None  Ideas of Reference:None  Suicidal Thoughts:Suicidal Thoughts: Yes, Passive SI Passive Intent and/or Plan: Without Intent  Homicidal Thoughts:Homicidal Thoughts: No   Sensorium  Memory:Recent Good; Immediate Good; Remote Good  Judgment:Intact  Insight:Fair   Executive Functions  Concentration:Fair  Attention Span:Fair  Recall:Good  Fund of Knowledge:Good  Language:Good   Psychomotor Activity  Psychomotor Activity:Psychomotor Activity: Normal   Assets  Assets:Communication Skills; Financial Resources/Insurance   Sleep  Sleep:Sleep: Fair   Physical Exam ROS  There were no vitals taken for this visit. There is no height or weight on file to calculate BMI.  Past Psychiatric History:   Is the patient at risk to self? No  Has the patient been a risk to self in the past 6 months? No .    Has the patient been a risk to self within the distant past? No   Is the patient a risk to others? No   Has the patient been a risk to others in the past 6 months? No   Has the patient been a risk to others within the distant past? No   Past Medical History:  Past Medical History:  Diagnosis  Date  . Abdominal pain   . Anxiety    pt denies - noted in several MD/hosp notes   . Asthma    hx of as teenager; induced with running now   . Blood in urine   . Brain tumor ([place]) 2011  . Chills   . Clotting disorder ([place])    . Complication of anesthesia    pt states is severe   . Confusion   . Depression    noted in several past MD notes/hosp notes pt to have major depressive disorder - pt denies   . Embolism - blood clot in left ovary  . Gallstones   . Headache    rarely has but currently has one this am   . Meningioma ([place]) 08/14/2011   S/p craniotomy Encephaloma present non changing on MRI in 08/2011  . Nausea   . Palpitations   . PONV (postoperative nausea and vomiting)   . Rectal bleeding   . Rectal pain   . Seizures ([place])    last one 2011  . TIA (transient ischemic attack)   . Unintentional weight loss   . Visual disturbance   . Weakness     Past Surgical History:  Procedure Laterality Date  . BRAIN SURGERY    . brain tumor removed  2011  . CHOLECYSTECTOMY  12/03/2011   Procedure: LAPAROSCOPIC CHOLECYSTECTOMY WITH INTRAOPERATIVE CHOLANGIOGRAM;  Surgeon: Madilyn Hook, DO;  Location: [facility];  Service: General;  Laterality: N/A;  . LAPAROSCOPIC OVARIAN CYSTECTOMY Bilateral 08/24/2014   Procedure: XI ROBOTIC ASSISTED BILATERAL LAPAROSCOPIC OVARIAN CYSTECTOMY RESECTION OF ENDOMETRIAL CYST;  Surgeon: Everitt Amber, MD;  [facility];  Service: Gynecology;  Laterality: Bilateral;  . ovarian mass removed  2010   left    Family History:  Family History  Problem Relation Age of Onset  . Depression Mother   . Heart failure Father   . Hypertension Father   . Heart disease Father   . Alcohol abuse Sister   . Alcohol abuse Brother   . Alzheimer's disease Maternal Grandmother   . Diabetes Maternal Grandmother   . Stroke Maternal Grandmother     Social History:  Social History   Socioeconomic History  . Marital status: Divorced    Spouse name: Not on file  . Number of children: Not on file  . Years of education: Not on file  . Highest education level: Not on file  Occupational History  . Not on file  Tobacco Use  . Smoking status: Never Smoker  . Smokeless tobacco: Never Used  Substance  and Sexual Activity  . Alcohol use: No  . Drug use: No  . Sexual activity: Not Currently  Other Topics Concern  . Not on file  Social History Narrative  . Not on file   Social Determinants of Health   Financial Resource Strain: Not on file  Food Insecurity: Not on file  Transportation Needs: Not on file  Physical Activity: Not on file  Stress: Not on file  Social Connections: Not on file  Intimate Partner Violence: Not on file    SDOH:  SDOH Screenings   Alcohol Screen: Not on file  Depression (PHQ2-9): Medium Risk  . PHQ-2 Score: 14  Financial Resource Strain: Not on file  Food Insecurity: Not on file  Housing: Not on file  Physical Activity: Not on file  Social Connections: Not on file  Stress: Not on file  Tobacco Use: Low Risk   . Smoking Tobacco Use: Never Smoker  .  Smokeless Tobacco Use: Never Used  Transportation Needs: Not on file    Last Labs:  Admission on 03/06/2020, Discharged on 03/06/2020  Component Date Value Ref Range Status  . Sodium 03/06/2020 138  135 - 145 mmol/L Final  . Potassium 03/06/2020 3.5  3.5 - 5.1 mmol/L Final  . Chloride 03/06/2020 102  98 - 111 mmol/L Final  . CO2 03/06/2020 23  22 - 32 mmol/L Final  . Glucose, Bld 03/06/2020 98  70 - 99 mg/dL Final   Glucose reference range applies only to samples taken after fasting for at least 8 hours.  . BUN 03/06/2020 16  6 - 20 mg/dL Final  . Creatinine, Ser 03/06/2020 0.75  0.44 - 1.00 mg/dL Final  . Calcium 03/06/2020 9.5  8.9 - 10.3 mg/dL Final  . Total Protein 03/06/2020 8.2* 6.5 - 8.1 g/dL Final  . Albumin 03/06/2020 4.7  3.5 - 5.0 g/dL Final  . AST 03/06/2020 13* 15 - 41 U/L Final  . ALT 03/06/2020 10  0 - 44 U/L Final  . Alkaline Phosphatase 03/06/2020 58  38 - 126 U/L Final  . Total Bilirubin 03/06/2020 0.8  0.3 - 1.2 mg/dL Final  . GFR, Estimated 03/06/2020 >60  >60 mL/min Final   Comment: (NOTE) Calculated using the CKD-EPI Creatinine Equation (2021)   . Anion gap 03/06/2020  13  5 - 15 Final   Performed at [facility], [place] [address], [place], [place] [address]  . WBC 03/06/2020 7.9  4.0 - 10.5 K/uL Final  . RBC 03/06/2020 5.14* 3.87 - 5.11 MIL/uL Final  . Hemoglobin 03/06/2020 15.6* 12.0 - 15.0 g/dL Final  . HCT 03/06/2020 46.7* 36.0 - 46.0 % Final  . MCV 03/06/2020 90.9  80.0 - 100.0 fL Final  . MCH 03/06/2020 30.4  26.0 - 34.0 pg Final  . MCHC 03/06/2020 33.4  30.0 - 36.0 g/dL Final  . RDW 03/06/2020 12.4  11.5 - 15.5 % Final  . Platelets 03/06/2020 280  150 - 400 K/uL Final  . nRBC 03/06/2020 0.0  0.0 - 0.2 % Final   Performed at [facility], [place] [address] [place], [place] [address]  . I-stat hCG, quantitative 03/06/2020 <5.0  <5 mIU/mL Final  . Comment 3 03/06/2020          Final   Comment:   GEST. AGE      CONC.  (mIU/mL)   <=1 WEEK        5 - 50     2 WEEKS       50 - 500     3 WEEKS       100 - 10,000     4 WEEKS     1,000 - 30,000        FEMALE AND NON-PREGNANT FEMALE:     LESS THAN 5 mIU/mL   . SARS Coronavirus 2 by RT PCR 03/06/2020 NEGATIVE  NEGATIVE Final   Comment: (NOTE) SARS-CoV-2 target nucleic acids are NOT DETECTED.  The SARS-CoV-2 RNA is generally detectable in upper respiratory specimens during the acute phase of infection. The lowest concentration of SARS-CoV-2 viral copies this assay can detect is 138 copies/mL. A negative result does not preclude SARS-Cov-2 infection and should not be used as the sole basis for treatment or other patient management decisions. A negative result may occur with  improper specimen collection/handling, submission of specimen other than nasopharyngeal swab, presence of viral mutation(s) within the areas targeted by this assay,  and inadequate number of viral copies(<138 copies/mL). A negative result must be combined with clinical observations, patient history, and epidemiological information. The expected result is Negative.  Fact Sheet for Patients:   EntrepreneurPulse.com.au  Fact Sheet for Healthcare Providers:  IncredibleEmployment.be  This test is no                          t yet approved or cleared by the [place] FDA and  has been authorized for detection and/or diagnosis of SARS-CoV-2 by FDA under an Emergency Use Authorization (EUA). This EUA will remain  in effect (meaning this test can be used) for the duration of the COVID-19 declaration under Section 564(b)(1) of the Act, 21 U.S.C.section 360bbb-3(b)(1), unless the authorization is terminated  or revoked sooner.      . Influenza A by PCR 03/06/2020 NEGATIVE  NEGATIVE Final  . Influenza B by PCR 03/06/2020 NEGATIVE  NEGATIVE Final   Comment: (NOTE) The Xpert Xpress SARS-CoV-2/FLU/RSV plus assay is intended as an aid in the diagnosis of influenza from Nasopharyngeal swab specimens and should not be used as a sole basis for treatment. Nasal washings and aspirates are unacceptable for Xpert Xpress SARS-CoV-2/FLU/RSV testing.  Fact Sheet for Patients: EntrepreneurPulse.com.au  Fact Sheet for Healthcare Providers: IncredibleEmployment.be  This test is not yet approved or cleared by the [place] FDA and has been authorized for detection and/or diagnosis of SARS-CoV-2 by FDA under an Emergency Use Authorization (EUA). This EUA will remain in effect (meaning this test can be used) for the duration of the COVID-19 declaration under Section 564(b)(1) of the Act, 21 U.S.C. section 360bbb-3(b)(1), unless the authorization is terminated or revoked.  Performed at [facility], [place] [address] [place], [place] [address]   . Color, Urine 03/06/2020 AMBER* YELLOW Final   BIOCHEMICALS MAY BE AFFECTED BY COLOR  . APPearance 03/06/2020 CLOUDY* CLEAR Final  . Specific Gravity, Urine 03/06/2020 1.031* 1.005 - 1.030 Final  . pH 03/06/2020 5.0  5.0 - 8.0 Final  . Glucose, UA  03/06/2020 NEGATIVE  NEGATIVE mg/dL Final  . Hgb urine dipstick 03/06/2020 NEGATIVE  NEGATIVE Final  . Bilirubin Urine 03/06/2020 NEGATIVE  NEGATIVE Final  . Ketones, ur 03/06/2020 20* NEGATIVE mg/dL Final  . Protein, ur 03/06/2020 30* NEGATIVE mg/dL Final  . Nitrite 03/06/2020 NEGATIVE  NEGATIVE Final  . Chalmers Guest 03/06/2020 NEGATIVE  NEGATIVE Final  . RBC / HPF 03/06/2020 6-10  0 - 5 RBC/hpf Final  . WBC, UA 03/06/2020 0-5  0 - 5 WBC/hpf Final  . Bacteria, UA 03/06/2020 NONE SEEN  NONE SEEN Final  . Squamous Epithelial / LPF 03/06/2020 0-5  0 - 5 Final  . Mucus 03/06/2020 PRESENT   Final   Performed at [facility], [place] [address], [place], [place] [address]    Allergies: Morphine and related  PTA Medications: (Not in a hospital admission)   Medical Decision Making  Overnight observation -Initiated on Zoloft 25 mg p.o. daily patient to receive first dose today -Initiated trazodone 50 mg p.o. as needed nightly    Recommendations  Based on my evaluation the patient does not appear to have an emergency medical condition.  Derrill Center, NP 03/06/20  1:49 PM

## 2020-03-06 NOTE — ED Notes (Addendum)
Patient changed into scrubs and belongings taken. Patient has two patient belongings bags stored in triage bins.

## 2020-03-06 NOTE — ED Provider Notes (Signed)
[facility] DEPT Provider Note: [name], MD, FACEP  CSN: [id number] MRN: [id number] ARRIVAL: 03/06/20 at [place]: [place]  Altered Mental Status   HISTORY OF PRESENT ILLNESS  03/06/20 5:13 AM Melanie [name] is a 44 y.o. female who reportedly was found sitting in a parked car and was not responsive to police attempts to get her attention.  They almost had to break into the car to perform a welfare check.  EMS reports she was not answering questions appropriately and was selective with what question she would answer.  The patient tells me that she was frightened in the car because she did not realize it was the police that were attempting to get her attention and she thought she was being attacked.  She states she had been waiting for an unknown period of time for a friend name "Melanie [name]" whose last name she is not sure of.  She states that this was a social visit with a friend.  She does not know if she went to sleep in the car.  She is not sure of some of the events prior to coming to the ED but is now aware of being in the ED.  She knows it was recently Christmas.  She denies any alcohol or drug use.  She denies any seizure history although she did have a meningioma resected in 2011.  She denies any suicidal ideation or homicidal ideation.  She is tells me her only complaint is that she is exhausted from lack of sleep.  This lack of sleep is due to stress.  She is not specific about what is causing the stress.   Past Medical History:  Diagnosis Date  . Abdominal pain   . Anxiety    pt denies - noted in several MD/hosp notes   . Asthma    hx of as teenager; induced with running now   . Blood in urine   . Brain tumor ([address]) 2011  . Chills   . Clotting disorder ([place])   . Complication of anesthesia    pt states is severe   . Confusion   . Depression    noted in several past MD notes/hosp notes pt to have major depressive disorder - pt denies   .  Embolism - blood clot in left ovary  . Gallstones   . Headache    rarely has but currently has one this am   . Meningioma ([place]) 08/14/2011   S/p craniotomy Encephaloma present non changing on MRI in 08/2011  . Nausea   . Palpitations   . PONV (postoperative nausea and vomiting)   . Rectal bleeding   . Rectal pain   . Seizures ([place])    last one 2011  . TIA (transient ischemic attack)   . Unintentional weight loss   . Visual disturbance   . Weakness     Past Surgical History:  Procedure Laterality Date  . BRAIN SURGERY    . brain tumor removed  2011  . CHOLECYSTECTOMY  12/03/2011   Procedure: LAPAROSCOPIC CHOLECYSTECTOMY WITH INTRAOPERATIVE CHOLANGIOGRAM;  Surgeon: Madilyn Hook, DO;  Location: [facility];  Service: General;  Laterality: N/A;  . LAPAROSCOPIC OVARIAN CYSTECTOMY Bilateral 08/24/2014   Procedure: XI ROBOTIC ASSISTED BILATERAL LAPAROSCOPIC OVARIAN CYSTECTOMY RESECTION OF ENDOMETRIAL CYST;  Surgeon: Everitt Amber, MD;  [facility];  Service: Gynecology;  Laterality: Bilateral;  . ovarian mass removed  2010   left    Family History  Problem Relation  Age of Onset  . Depression Mother   . Heart failure Father   . Hypertension Father   . Heart disease Father   . Alcohol abuse Sister   . Alcohol abuse Brother   . Alzheimer's disease Maternal Grandmother   . Diabetes Maternal Grandmother   . Stroke Maternal Grandmother     Social History   Tobacco Use  . Smoking status: Never Smoker  . Smokeless tobacco: Never Used  Substance Use Topics  . Alcohol use: No  . Drug use: No    Prior to Admission medications   Medication Sig Start Date End Date Taking? Authorizing Provider  ARIPiprazole (ABILIFY) 15 MG tablet Take 1 tablet (15 mg total) by mouth daily. For mood control 12/19/18   Lindell Spar I, NP  DULoxetine (CYMBALTA) 60 MG capsule Take 1 capsule (60 mg total) by mouth daily. For depression 12/19/18   Lindell Spar I, NP  terbinafine (LAMISIL) 250 MG tablet Take 1  tablet (250 mg total) by mouth daily. 12/01/19   Criselda Peaches, DPM    Allergies Morphine and related   REVIEW OF SYSTEMS  Negative except as noted here or in the History of Present Illness.   PHYSICAL EXAMINATION  Initial Vital Signs Blood pressure 127/83, pulse 82, temperature (!) 97.5 F (36.4 C), temperature source Oral, resp. rate 16, height 5\' 2"  (1.575 m), weight 59 kg, SpO2 100 %.  Examination General: Well-developed, well-nourished female in no acute distress; appearance consistent with age of record; well dressed HENT: normocephalic; atraumatic Eyes: pupils equal, round and reactive to light; extraocular muscles intact Neck: supple Heart: regular rate and rhythm Lungs: clear to auscultation bilaterally Abdomen: soft; nondistended; nontender; bowel sounds present Extremities: No deformity; full range of motion; pulses normal Neurologic: Awake, alert and oriented except for time of day; motor function intact in all extremities and symmetric; no facial droop Skin: Warm and dry Psychiatric: Flat affect; no SI; no HI   RESULTS  Summary of this visit's results, reviewed and interpreted by myself:   EKG Interpretation  Date/Time:    Ventricular Rate:    PR Interval:    QRS Duration:   QT Interval:    QTC Calculation:   R Axis:     Text Interpretation:        Laboratory Studies: No results found for this or any previous visit (from the past 24 hour(s)). Imaging Studies: No results found.  ED COURSE and MDM  Nursing notes, initial and subsequent vitals signs, including pulse oximetry, reviewed and interpreted by myself.  Vitals:   03/06/20 0516  BP: 127/83  Pulse: 82  Resp: 16  Temp: (!) 97.5 F (36.4 C)  TempSrc: Oral  SpO2: 100%  Weight: 59 kg  Height: 5\' 2"  (1.575 m)   Medications - No data to display  It is unclear what occurred prior to the patient's arrival.  She could have had a seizure and been post ictal.  She could have been in a  hypnagogic or hypnopompic state associated with sleep deprivation.  She is now awake, alert and able to answer my questions coherently.  She denies any SI or HI and wishes only to be allowed to go home and sleep.  PROCEDURES  Procedures   ED DIAGNOSES     ICD-10-CM   1. Sleep deprivation  Z72.820        Shanon Rosser, MD 03/06/20 [phone]

## 2020-03-06 NOTE — ED Notes (Signed)
Patient and belongings transferred with patient to [facility] via safe transport.

## 2020-03-06 NOTE — ED Notes (Signed)
Patient is resting in bed with eyes closed. Respirations even and non labored. No distress noted. Monitoring continues. 

## 2020-03-06 NOTE — Consult Note (Addendum)
Patient recommended to transfer to [facility] once labs are obtained and report called to [phone].  Waylan Boga, PMHNP

## 2020-03-06 NOTE — ED Notes (Signed)
[place] number [id number]

## 2020-03-06 NOTE — ED Notes (Signed)
Patient admitted to observation unit. Patient denies SI at this time. Patient oriented to unit and staff. Skin assessment completed and skin is intact with no abnormal marks noted. Monitoring continues and patient remains safe on unit.

## 2020-03-06 NOTE — BH Assessment (Signed)
Assessment Note  Melanie [name] is an 44 y.o. female.   Diagnosis: Major Depressive Disorder, Recurrent, Severe w/o psychotic features  NARRATIVE:  Pt is a 44 year old female who presented to [facility] on a voluntary basis (transported by EMS) due to non-responsiveness after being found in her car.  Pt now endorses suicidal ideation (without plan or intent).  Pt is homeless and lives in the [place] area.  She stated that she is on disability and that she does not have an outpatient psychiatrist or therapist.  Per notes, Pt was found sleeping in her car by police.  It took several minutes to rouse her, and when roused, Pt was confused.  She was transported to [facility] for evaluation.  As she was being discharged, Pt endorsed suicidal ideation.  Pt reported that she has been homeless for six months, and as a result, she feels very depressed.  Pt stated that she has felt suicidal (no plan or intent) for about a week, and she also endorsed three past suicide attempts.  In addition to suicidal ideation, Pt endorsed despondency, poor sleep (resulting in today's hypersomnia), poor appetite, hopelessness and worthlessness, and low energy.  Pt denied hallucination, homicidal ideation, self-injurious behavior, and substance use concerns.  Pt reported that she was raised by foster parents.  During assessment, Pt presented as alert and oriented.  She had good eye contact and was cooperative.  Pt was dressed in scrubs, and she appeared appropriately groomed.  Pt's mood and affect were depressed.  Speech was normal in rate, rhythm, and volume.  Thought processes were within normal range, and thought content was logical and goal-oriented.  There was no evidence of delusion.  Pt's memory and concentration were intact.  Insight, judgment, and impulse control were fair.  Consulted with Melanie Sheng, NP, who determined that Pt shall be transported to [facility].  Past Medical History:  Past Medical History:  Diagnosis Date  .  Abdominal pain   . Anxiety    pt denies - noted in several MD/hosp notes   . Asthma    hx of as teenager; induced with running now   . Blood in urine   . Brain tumor ([place]) 2011  . Chills   . Clotting disorder ([place])   . Complication of anesthesia    pt states is severe   . Confusion   . Depression    noted in several past MD notes/hosp notes pt to have major depressive disorder - pt denies   . Embolism - blood clot in left ovary  . Gallstones   . Headache    rarely has but currently has one this am   . Meningioma ([place]) 08/14/2011   S/p craniotomy Encephaloma present non changing on MRI in 08/2011  . Nausea   . Palpitations   . PONV (postoperative nausea and vomiting)   . Rectal bleeding   . Rectal pain   . Seizures ([place])    last one 2011  . TIA (transient ischemic attack)   . Unintentional weight loss   . Visual disturbance   . Weakness     Past Surgical History:  Procedure Laterality Date  . BRAIN SURGERY    . brain tumor removed  2011  . CHOLECYSTECTOMY  12/03/2011   Procedure: LAPAROSCOPIC CHOLECYSTECTOMY WITH INTRAOPERATIVE CHOLANGIOGRAM;  Surgeon: Madilyn Hook, DO;  Location: [facility];  Service: General;  Laterality: N/A;  . LAPAROSCOPIC OVARIAN CYSTECTOMY Bilateral 08/24/2014   Procedure: XI ROBOTIC ASSISTED BILATERAL LAPAROSCOPIC OVARIAN CYSTECTOMY RESECTION  OF ENDOMETRIAL CYST;  Surgeon: Everitt Amber, MD;  [facility];  Service: Gynecology;  Laterality: Bilateral;  . ovarian mass removed  2010   left    Family History:  Family History  Problem Relation Age of Onset  . Depression Mother   . Heart failure Father   . Hypertension Father   . Heart disease Father   . Alcohol abuse Sister   . Alcohol abuse Brother   . Alzheimer's disease Maternal Grandmother   . Diabetes Maternal Grandmother   . Stroke Maternal Grandmother     Social History:  reports that she has never smoked. She has never used smokeless tobacco. She reports that she does not drink alcohol and  does not use drugs.  Additional Social History:  Alcohol / Drug Use Pain Medications: Please see MAR Prescriptions: Please see MAR Over the Counter: Please see MAR History of alcohol / drug use?: No history of alcohol / drug abuse  CIWA: CIWA-Ar BP: 125/72 COWS:    Allergies:  Allergies  Allergen Reactions  . Morphine And Related Nausea And Vomiting    Home Medications: (Not in a hospital admission)   OB/GYN Status:  No LMP recorded.  General Assessment Data Location of Assessment: [facility] Marital status: Divorced                    Mental Status Report Motor Activity: Unremarkable                            Advance Directives (For Healthcare) Does Patient Have a Medical Advance Directive?: No Would patient like information on creating a medical advance directive?: No - Patient declined          Disposition:     On Site Evaluation by:   Reviewed with Physician:    Melanie [name] Melanie [name] 03/06/2020 11:02 AM

## 2020-03-06 NOTE — ED Notes (Signed)
Patient calm and cooperative. Obtaining urine sample at this time.

## 2020-03-06 NOTE — ED Triage Notes (Signed)
Patient was unresponsive in car and was not acknowledging EMS. Patient brought in by [facility]. Patient was not answering  But was selective what she answered.

## 2020-03-06 NOTE — ED Triage Notes (Signed)
Patient was discharged as we were walking her out to lobby. Patient states that she is going to take all her pills that is in purse. Patient purse was taken

## 2020-03-06 NOTE — ED Notes (Signed)
Safe transport called for transport. 

## 2020-03-06 NOTE — ED Notes (Signed)
Pt is sleeping@this  time. breathing even and unlabored . Will continue to monitor  for safety

## 2020-03-06 NOTE — ED Notes (Signed)
Pt is sleeping@this  time. Breathing even and unlabored, will continue to monitor for safety

## 2020-03-06 NOTE — ED Notes (Signed)
Patient given warm blankets and is resting at this time.

## 2020-03-07 DIAGNOSIS — F332 Major depressive disorder, recurrent severe without psychotic features: Secondary | ICD-10-CM | POA: Diagnosis not present

## 2020-03-07 DIAGNOSIS — R45851 Suicidal ideations: Secondary | ICD-10-CM | POA: Diagnosis present

## 2020-03-07 DIAGNOSIS — Z5902 Unsheltered homelessness: Secondary | ICD-10-CM | POA: Diagnosis not present

## 2020-03-07 MED ORDER — ARIPIPRAZOLE 10 MG PO TABS
10.0000 mg | ORAL_TABLET | Freq: Every day | ORAL | Status: DC
  Filled 2020-03-07: qty 7

## 2020-03-07 MED ORDER — DULOXETINE HCL 30 MG PO CPEP: 30.0000 mg | ORAL_CAPSULE | Freq: Every day | ORAL | 0 refills | Status: DC

## 2020-03-07 MED ORDER — ARIPIPRAZOLE 10 MG PO TABS: 10.0000 mg | ORAL_TABLET | Freq: Every day | ORAL | 0 refills | Status: DC

## 2020-03-07 MED ORDER — ARIPIPRAZOLE 2 MG PO TABS: 10.0000 mg | ORAL_TABLET | Freq: Every day | ORAL | 0 refills | Status: DC

## 2020-03-07 MED ORDER — DULOXETINE HCL 30 MG PO CPEP
30.0000 mg | ORAL_CAPSULE | Freq: Every day | ORAL | Status: DC
  Filled 2020-03-07: qty 7

## 2020-03-07 NOTE — ED Provider Notes (Signed)
FBC/OBS ASAP Discharge Summary  Date and Time: 03/07/2020 11:00 AM  Name: Melanie [name]  MRN:  [id number]   Discharge Diagnoses:  Final diagnoses:  Severe recurrent major depression without psychotic features [facility])  Homelessness    Subjective: Patient reports this morning that she is doing somewhat better. At first this morning the patient had presented in the bed stating that she could not move laying face down against the pillow. It was witnessed the patient was lifting her leg and when patient was asked to move her leg again she did lift up and then stated that she could not move anything else. Nurses assisted patient was sitting up and then she was able to move around in the bed independently and got up and walked to the chair to sit down without any difficulty. Patient reports that her biggest concern is that she has nowhere to go. She states that she has been living in her car for the past 6 months because she has been homeless. She states that she is used drugs in the past and that she is never attempted to kill herself but has unintentionally overdosed twice. She states that she is needing housing more than anything and is not interested in substance abuse treatment. Patient was informed of [facility] rescue mission and informed that it is a walk-in basis for assessment and there is no guarantee of a bed. Patient stated understanding and agreement and requested transportation to the [facility] rescue mission. Patient had denied any suicidal or homicidal ideations and denied any hallucinations prior to discharge.  Stay Summary: Patient is a 44 year old female who was found sleeping in her car by police and difficult to arouse and due to being confused patient was transferred to [place] along the ED for medical clearance. After patient was medically cleared and being discharged patient endorsed suicidal ideations and was transferred to the [facility] for overnight observation. Patient was started on Zoloft.  This morning the patient reports that she is doing somewhat better. She reports that she was previously admitted to [facility] in 2020 and that the medications Abilify and Cymbalta worked very well for her but she has not taken them and almost a year. Patient is requesting to be restarted on Cymbalta and Abilify. Abilify started at 10 mg p.o. daily and Cymbalta 30 with instructions to titrate to 60 mg a day after 2 weeks. Patient is requesting housing to [facility]. Patient is informed of the regulations for [facility] and also informed that it is not a guarantee bed and is a walk-in assessment for placement. Patient stated understanding and agreement and requested transportation to [facility]. Patient was provided with 7-day samples and 30-day prescriptions of her medications. Patient was provided with transportation to facility via safe transport. Patient continued to deny any suicidal homicidal ideations and denied any hallucinations at discharge.  Total Time spent with patient: 30 minutes  Past Psychiatric History: Depression, polysubstance abuse, previous hospitalizations, unintentional overdose with illicit substances Past Medical History:  Past Medical History:  Diagnosis Date  . Abdominal pain   . Anxiety    pt denies - noted in several MD/hosp notes   . Asthma    hx of as teenager; induced with running now   . Blood in urine   . Brain tumor ([place]) 2011  . Chills   . Clotting disorder ([place])   . Complication of anesthesia    pt states is severe   . Confusion   .  Depression    noted in several past MD notes/hosp notes pt to have major depressive disorder - pt denies   . Embolism - blood clot in left ovary  . Gallstones   . Headache    rarely has but currently has one this am   . Meningioma ([place]) 08/14/2011   S/p craniotomy Encephaloma present non changing on MRI in 08/2011  . Nausea   . Palpitations   . PONV (postoperative nausea and vomiting)   . Rectal  bleeding   . Rectal pain   . Seizures ([place])    last one 2011  . TIA (transient ischemic attack)   . Unintentional weight loss   . Visual disturbance   . Weakness     Past Surgical History:  Procedure Laterality Date  . BRAIN SURGERY    . brain tumor removed  2011  . CHOLECYSTECTOMY  12/03/2011   Procedure: LAPAROSCOPIC CHOLECYSTECTOMY WITH INTRAOPERATIVE CHOLANGIOGRAM;  Surgeon: Madilyn Hook, DO;  Location: [facility];  Service: General;  Laterality: N/A;  . LAPAROSCOPIC OVARIAN CYSTECTOMY Bilateral 08/24/2014   Procedure: XI ROBOTIC ASSISTED BILATERAL LAPAROSCOPIC OVARIAN CYSTECTOMY RESECTION OF ENDOMETRIAL CYST;  Surgeon: Everitt Amber, MD;  [facility];  Service: Gynecology;  Laterality: Bilateral;  . ovarian mass removed  2010   left   Family History:  Family History  Problem Relation Age of Onset  . Depression Mother   . Heart failure Father   . Hypertension Father   . Heart disease Father   . Alcohol abuse Sister   . Alcohol abuse Brother   . Alzheimer's disease Maternal Grandmother   . Diabetes Maternal Grandmother   . Stroke Maternal Grandmother    Family Psychiatric History: Mom-depression, sister and brother-alcohol abuse Social History:  Social History   Substance and Sexual Activity  Alcohol Use No     Social History   Substance and Sexual Activity  Drug Use No    Social History   Socioeconomic History  . Marital status: Divorced    Spouse name: Not on file  . Number of children: Not on file  . Years of education: Not on file  . Highest education level: Not on file  Occupational History  . Not on file  Tobacco Use  . Smoking status: Never Smoker  . Smokeless tobacco: Never Used  Substance and Sexual Activity  . Alcohol use: No  . Drug use: No  . Sexual activity: Not Currently  Other Topics Concern  . Not on file  Social History Narrative  . Not on file   Social Determinants of Health   Financial Resource Strain: Not on file  Food  Insecurity: Not on file  Transportation Needs: Not on file  Physical Activity: Not on file  Stress: Not on file  Social Connections: Not on file   SDOH:  SDOH Screenings   Alcohol Screen: Not on file  Depression (PHQ2-9): Medium Risk  . PHQ-2 Score: 14  Financial Resource Strain: Not on file  Food Insecurity: Not on file  Housing: Not on file  Physical Activity: Not on file  Social Connections: Not on file  Stress: Not on file  Tobacco Use: Low Risk   . Smoking Tobacco Use: Never Smoker  . Smokeless Tobacco Use: Never Used  Transportation Needs: Not on file    Has this patient used any form of tobacco in the last 30 days? (Cigarettes, Smokeless Tobacco, Cigars, and/or Pipes) A prescription for an FDA-approved tobacco cessation medication was offered at discharge and  the patient refused  Current Medications:  Current Facility-Administered Medications  Medication Dose Route Frequency Provider Last Rate Last Admin  . acetaminophen (TYLENOL) tablet 650 mg  650 mg Oral Q6H PRN Oneta Rack, NP      . alum & mag hydroxide-simeth (MAALOX/MYLANTA) 200-200-20 MG/5ML suspension 30 mL  30 mL Oral Q4H PRN Oneta Rack, NP      . ARIPiprazole (ABILIFY) tablet 10 mg  10 mg Oral Daily Isaah Furry, Gerlene Burdock, FNP      . DULoxetine (CYMBALTA) DR capsule 30 mg  30 mg Oral Daily Flecia Shutter B, FNP      . magnesium hydroxide (MILK OF MAGNESIA) suspension 30 mL  30 mL Oral Daily PRN Oneta Rack, NP      . traZODone (DESYREL) tablet 50 mg  50 mg Oral QHS PRN Oneta Rack, NP       Current Outpatient Medications  Medication Sig Dispense Refill  . ARIPiprazole (ABILIFY) 10 MG tablet Take 1 tablet (10 mg total) by mouth daily. 30 tablet 0  . DULoxetine (CYMBALTA) 30 MG capsule Take 1 capsule (30 mg total) by mouth daily. 1 tab daily for 14 days then 2 tabs daily to increase to 60 mg Daily 45 capsule 0    PTA Medications: (Not in a hospital admission)   Musculoskeletal  Strength & Muscle  Tone: within normal limits Gait & Station: normal Patient leans: N/A  Psychiatric Specialty Exam  Presentation  General Appearance: Appropriate for Environment; Disheveled  Eye Contact:Good  Speech:Clear and Coherent; Normal Rate  Speech Volume:Normal  Handedness:Right   Mood and Affect  Mood:Euthymic  Affect:Appropriate; Congruent   Thought Process  Thought Processes:Coherent  Descriptions of Associations:Intact  Orientation:Full (Time, Place and Person)  Thought Content:WDL  Hallucinations:Hallucinations: None  Ideas of Reference:None  Suicidal Thoughts:Suicidal Thoughts: No SI Passive Intent and/or Plan: Without Intent  Homicidal Thoughts:Homicidal Thoughts: No   Sensorium  Memory:Immediate Good; Recent Good; Remote Good  Judgment:Intact  Insight:Good   Executive Functions  Concentration:Good  Attention Span:Good  Recall:Good  Fund of Knowledge:Good  Language:Good   Psychomotor Activity  Psychomotor Activity:Psychomotor Activity: Normal   Assets  Assets:Communication Skills; Desire for Improvement; Housing; Health and safety inspector; Physical Health; Social Support; Transportation   Sleep  Sleep:Sleep: Good   Physical Exam  Physical Exam Vitals and nursing note reviewed.  Constitutional:      Appearance: She is well-developed.  HENT:     Head: Normocephalic.  Eyes:     Pupils: Pupils are equal, round, and reactive to light.  Cardiovascular:     Rate and Rhythm: Normal rate.  Pulmonary:     Effort: Pulmonary effort is normal.  Musculoskeletal:        General: Normal range of motion.  Neurological:     Mental Status: She is alert and oriented to person, place, and time.    Review of Systems  Constitutional: Negative.   HENT: Negative.   Eyes: Negative.   Respiratory: Negative.   Cardiovascular: Negative.   Gastrointestinal: Negative.   Genitourinary: Negative.   Musculoskeletal: Negative.   Skin: Negative.    Neurological: Negative.   Endo/Heme/Allergies: Negative.   Psychiatric/Behavioral: Negative.    Blood pressure 128/90, pulse (!) 102, temperature 98.7 F (37.1 C), temperature source Oral, resp. rate 18, SpO2 98 %. There is no height or weight on file to calculate BMI.  Demographic Factors:  Caucasian, Low socioeconomic status, Living alone and Unemployed  Loss Factors: NA  Historical Factors: NA  Risk  Reduction Factors:   Living with another person, especially a relative, Positive social support and Positive therapeutic relationship  Continued Clinical Symptoms:  Alcohol/Substance Abuse/Dependencies Previous Psychiatric Diagnoses and Treatments  Cognitive Features That Contribute To Risk:  None    Suicide Risk:  Mild:  Suicidal ideation of limited frequency, intensity, duration, and specificity.  There are no identifiable plans, no associated intent, mild dysphoria and related symptoms, good self-control (both objective and subjective assessment), few other risk factors, and identifiable protective factors, including available and accessible social support.  Plan Of Care/Follow-up recommendations:  Continue activity as tolerated. Continue diet as recommended by your PCP. Ensure to keep all appointments with outpatient providers.  Disposition: Discharge to [place], [place] 03/07/2020, 11:00 AM

## 2020-03-07 NOTE — ED Notes (Signed)
Pt sleeping. No distress observed. Safety maintained and will continue to monitor.

## 2020-03-07 NOTE — ED Notes (Signed)
Patient reported that she felt paralyzed stating she could not move. MHT reported findings to RN.

## 2020-03-07 NOTE — Discharge Instructions (Signed)

## 2020-03-07 NOTE — ED Notes (Signed)
Pt escorted to [employer] to [facility] for services to [employer]. Personal belongings returned prior to d/c from facility. Safety maintained.

## 2020-03-07 NOTE — ED Notes (Signed)
Discharge instructions provided. Pt stated understanding. [facility] called for services to [facility] [employer]. Safety maintained.

## 2020-03-07 NOTE — ED Notes (Signed)
Pt sleeping at present, no distress noted, monitoring for safety. 

## 2020-03-07 NOTE — ED Notes (Signed)
Pt stated that she was unable to move herself to staff. Upon assessment, Pt lifted BLE but then stated that she could only lift the RLE. Burnetta Sabin NP came to assist staff w/assessment. Staff assisted Pt onto her back. Pt then proceeded to get up from the pull out bed and sat in the chair beside the bed w/o staff assistance.Pt skin was clammy. Will continue to monitor.

## 2020-03-09 DIAGNOSIS — R410 Disorientation, unspecified: Secondary | ICD-10-CM | POA: Diagnosis not present

## 2020-03-09 DIAGNOSIS — E872 Acidosis: Secondary | ICD-10-CM | POA: Diagnosis not present

## 2020-03-09 DIAGNOSIS — Z79899 Other long term (current) drug therapy: Secondary | ICD-10-CM | POA: Diagnosis not present

## 2020-03-09 DIAGNOSIS — F32A Depression, unspecified: Secondary | ICD-10-CM | POA: Diagnosis not present

## 2020-03-09 DIAGNOSIS — R404 Transient alteration of awareness: Secondary | ICD-10-CM | POA: Diagnosis not present

## 2020-03-09 DIAGNOSIS — R5381 Other malaise: Secondary | ICD-10-CM | POA: Diagnosis not present

## 2020-03-10 DIAGNOSIS — F32A Depression, unspecified: Secondary | ICD-10-CM | POA: Diagnosis not present

## 2020-03-11 DIAGNOSIS — F32A Depression, unspecified: Secondary | ICD-10-CM | POA: Diagnosis not present

## 2020-03-13 DIAGNOSIS — R413 Other amnesia: Secondary | ICD-10-CM | POA: Diagnosis not present

## 2020-03-15 DIAGNOSIS — Z23 Encounter for immunization: Secondary | ICD-10-CM | POA: Diagnosis not present

## 2020-03-24 ENCOUNTER — Ambulatory Visit: Payer: Medicare Other | Admitting: Podiatry

## 2020-03-25 ENCOUNTER — Emergency Department (HOSPITAL_COMMUNITY)
Admission: EM | Admit: 2020-03-25 | Discharge: 2020-03-25 | Disposition: A | Discharge status: Home / Self Care | Payer: Medicare Other | Attending: Emergency Medicine | Admitting: Emergency Medicine

## 2020-03-25 ENCOUNTER — Encounter (HOSPITAL_COMMUNITY): Payer: Self-pay

## 2020-03-25 ENCOUNTER — Other Ambulatory Visit: Payer: Self-pay

## 2020-03-25 DIAGNOSIS — Z48811 Encounter for surgical aftercare following surgery on the nervous system: Secondary | ICD-10-CM | POA: Insufficient documentation

## 2020-03-25 DIAGNOSIS — Z8673 Personal history of transient ischemic attack (TIA), and cerebral infarction without residual deficits: Secondary | ICD-10-CM | POA: Insufficient documentation

## 2020-03-25 DIAGNOSIS — R251 Tremor, unspecified: Secondary | ICD-10-CM | POA: Diagnosis not present

## 2020-03-25 DIAGNOSIS — Z760 Encounter for issue of repeat prescription: Secondary | ICD-10-CM | POA: Insufficient documentation

## 2020-03-25 DIAGNOSIS — G4700 Insomnia, unspecified: Secondary | ICD-10-CM | POA: Diagnosis not present

## 2020-03-25 DIAGNOSIS — Z9049 Acquired absence of other specified parts of digestive tract: Secondary | ICD-10-CM | POA: Diagnosis not present

## 2020-03-25 DIAGNOSIS — J45909 Unspecified asthma, uncomplicated: Secondary | ICD-10-CM | POA: Insufficient documentation

## 2020-03-25 MED ORDER — DIPHENHYDRAMINE HCL 25 MG PO TABS: 25.0000 mg | ORAL_TABLET | Freq: Four times a day (QID) | ORAL | 0 refills | Status: DC | PRN

## 2020-03-25 MED ORDER — DIPHENHYDRAMINE HCL 25 MG PO CAPS
50.0000 mg | ORAL_CAPSULE | Freq: Once | ORAL | Status: AC
  Administered 2020-03-25: 50 mg via ORAL
  Filled 2020-03-25: qty 2

## 2020-03-25 NOTE — ED Provider Notes (Signed)
[facility] EMERGENCY DEPARTMENT Provider Note   CSN: [id number] Arrival date & time: 03/25/20  0617     History Chief Complaint  Patient presents with  . Can't sleep  . Medication Refill    Melanie [name] is a 45 y.o. female presented emergency department with difficulty sleeping and general tremors.  The patient reports that he started on Vistaril, Prozac, and Abilify 2 weeks ago.  She describes has been having small shaking episodes persistently, and difficulty sleeping at night.  She cannot tell me when this started.  She states "This is an emergency, I can't sleep, I'm not going home like this."  HPI     Past Medical History:  Diagnosis Date  . Abdominal pain   . Anxiety    pt denies - noted in several MD/hosp notes   . Asthma    hx of as teenager; induced with running now   . Blood in urine   . Brain tumor ([place]) 2011  . Chills   . Clotting disorder ([place])   . Complication of anesthesia    pt states is severe   . Confusion   . Depression    noted in several past MD notes/hosp notes pt to have major depressive disorder - pt denies   . Embolism - blood clot in left ovary  . Gallstones   . Headache    rarely has but currently has one this am   . Meningioma ([place]) 08/14/2011   S/p craniotomy Encephaloma present non changing on MRI in 08/2011  . Nausea   . Palpitations   . PONV (postoperative nausea and vomiting)   . Rectal bleeding   . Rectal pain   . Seizures ([place])    last one 2011  . TIA (transient ischemic attack)   . Unintentional weight loss   . Visual disturbance   . Weakness     Patient Active Problem List   Diagnosis Date Noted  . Severe recurrent major depression with psychotic features ([place])   . Unspecified psychosis not due to a substance or known physiological condition ([place])   . Severe recurrent major depression without psychotic features ([place]) 12/13/2018  . S/P laparoscopic surgery 08/24/2014  . Endometriosis of ovary  08/24/2014  . Endometriosis   . Syncope 06/25/2014  . TIA (transient ischemic attack) 08/27/2013  . Pre-syncope 01/30/2012  . Major depressive disorder 11/05/2011  . Dizziness 08/15/2011  . Insomnia 08/15/2011  . Meningioma ([place]) 08/14/2011    Past Surgical History:  Procedure Laterality Date  . BRAIN SURGERY    . brain tumor removed  2011  . CHOLECYSTECTOMY  12/03/2011   Procedure: LAPAROSCOPIC CHOLECYSTECTOMY WITH INTRAOPERATIVE CHOLANGIOGRAM;  Surgeon: Madilyn Hook, DO;  Location: [facility];  Service: General;  Laterality: N/A;  . LAPAROSCOPIC OVARIAN CYSTECTOMY Bilateral 08/24/2014   Procedure: XI ROBOTIC ASSISTED BILATERAL LAPAROSCOPIC OVARIAN CYSTECTOMY RESECTION OF ENDOMETRIAL CYST;  Surgeon: Everitt Amber, MD;  Location: [facility];  Service: Gynecology;  Laterality: Bilateral;  . ovarian mass removed  2010   left     OB History   No obstetric history on file.     Family History  Problem Relation Age of Onset  . Depression Mother   . Heart failure Father   . Hypertension Father   . Heart disease Father   . Alcohol abuse Sister   . Alcohol abuse Brother   . Alzheimer's disease Maternal Grandmother   . Diabetes Maternal Grandmother   . Stroke Maternal Grandmother  Social History   Tobacco Use  . Smoking status: Never Smoker  . Smokeless tobacco: Never Used  Substance Use Topics  . Alcohol use: No  . Drug use: No    Home Medications Prior to Admission medications   Medication Sig Start Date End Date Taking? Authorizing Provider  diphenhydrAMINE (BENADRYL) 25 MG tablet Take 1 tablet (25 mg total) by mouth every 6 (six) hours as needed for up to 20 doses for sleep. 03/25/20  Yes Trifan, Carola Rhine, MD  DULoxetine (CYMBALTA) 30 MG capsule Take 1 capsule (30 mg total) by mouth daily. 1 tab daily for 14 days then 2 tabs daily to increase to 60 mg Daily 03/07/20   Money, Darnelle Maffucci B, FNP  ARIPiprazole (ABILIFY) 10 MG tablet Take 1 tablet (10 mg total) by mouth daily. 03/07/20  03/25/20  Money, Lowry Ram, FNP    Allergies    Morphine and related  Review of Systems   Review of Systems  Constitutional: Negative for chills and fever.  Psychiatric/Behavioral: Positive for agitation and self-injury.    Physical Exam Updated Vital Signs BP (!) 138/94 (BP Location: Right Arm)   Pulse 79   Temp 97.9 F (36.6 C) (Oral)   Resp 18   SpO2 100%   Physical Exam Constitutional:      General: She is not in acute distress.    Comments: Frequent small twitches, shakes  HENT:     Head: Normocephalic and atraumatic.  Eyes:     Conjunctiva/sclera: Conjunctivae normal.     Pupils: Pupils are equal, round, and reactive to light.  Cardiovascular:     Rate and Rhythm: Normal rate and regular rhythm.  Pulmonary:     Effort: Pulmonary effort is normal. No respiratory distress.  Neurological:     General: No focal deficit present.     Mental Status: She is alert and oriented to person, place, and time.     ED Results / Procedures / Treatments   Labs (all labs ordered are listed, but only abnormal results are displayed) Labs Reviewed - No data to display  EKG None  Radiology No results found.  Procedures Procedures (including critical care time)  Medications Ordered in ED Medications  diphenhydrAMINE (BENADRYL) capsule 50 mg (50 mg Oral Given 03/25/20 0815)    ED Course  I have reviewed the triage vital signs and the nursing notes.  Pertinent labs & imaging results that were available during my care of the patient were reviewed by me and considered in my medical decision making (see chart for details).  45 year old female presenting to Ed with complaint of shaking, difficulty sleeping Vitals wnl No focal findings on exam to suggest infection  This may be anxiety related or a potential side effect of her medications  Abilify should not be associated with significant TD, but I can't exclude the possibility entirely, so I'll start her on benadryl and  advise that she stop taking this medication until discussing with her therapist again  We'll try some benadryl here, d/c with therapist follow up   Final Clinical Impression(s) / ED Diagnoses Final diagnoses:  Shaking  Insomnia, unspecified type    Rx / DC Orders ED Discharge Orders         Ordered    diphenhydrAMINE (BENADRYL) 25 MG tablet  Every 6 hours PRN        03/25/20 0815           Wyvonnia Dusky, MD 03/25/20 [phone]

## 2020-03-25 NOTE — ED Triage Notes (Signed)
Pt here c/o inability to sleep after vistaril, prozac & abilify prescriptions written. Pt taking all medications as prescribed, states she gets has been unable to sleep all night & gets uncontrollable shakes when she lays down.

## 2020-03-25 NOTE — Discharge Instructions (Signed)
Call the behavioral health center or the provider who prescribed you these medications to talk to them about your symptoms.  There is a small possibility you are having side effects to one of your medications.  In the meantime, you should STOP the Abilify for now, and start taking benadryl 25 mg every 6 hours for the next 3 days.    You can continue taking your prozac and your visteril (hydroxyzine).

## 2020-04-08 DIAGNOSIS — R079 Chest pain, unspecified: Secondary | ICD-10-CM | POA: Diagnosis not present

## 2020-04-08 DIAGNOSIS — R195 Other fecal abnormalities: Secondary | ICD-10-CM | POA: Diagnosis not present

## 2020-04-08 DIAGNOSIS — R1032 Left lower quadrant pain: Secondary | ICD-10-CM | POA: Diagnosis not present

## 2020-04-08 DIAGNOSIS — R42 Dizziness and giddiness: Secondary | ICD-10-CM | POA: Diagnosis not present

## 2020-04-14 DIAGNOSIS — R5383 Other fatigue: Secondary | ICD-10-CM | POA: Diagnosis not present

## 2020-04-14 DIAGNOSIS — Z8349 Family history of other endocrine, nutritional and metabolic diseases: Secondary | ICD-10-CM | POA: Diagnosis not present

## 2020-04-15 DIAGNOSIS — R5383 Other fatigue: Secondary | ICD-10-CM | POA: Diagnosis not present

## 2020-04-15 DIAGNOSIS — Z8349 Family history of other endocrine, nutritional and metabolic diseases: Secondary | ICD-10-CM | POA: Diagnosis not present

## 2020-05-24 ENCOUNTER — Ambulatory Visit (INDEPENDENT_AMBULATORY_CARE_PROVIDER_SITE_OTHER): Payer: Medicare Other | Admitting: Podiatry

## 2020-05-24 ENCOUNTER — Other Ambulatory Visit: Payer: Self-pay

## 2020-05-24 DIAGNOSIS — B351 Tinea unguium: Secondary | ICD-10-CM | POA: Diagnosis not present

## 2020-05-24 MED ORDER — TERBINAFINE HCL 250 MG PO TABS: 250.0000 mg | ORAL_TABLET | Freq: Every day | ORAL | 0 refills | Status: DC

## 2020-05-24 MED ORDER — TERBINAFINE HCL 250 MG PO TABS: 250.0000 mg | ORAL_TABLET | Freq: Every day | ORAL | 0 refills | Status: AC

## 2020-05-25 ENCOUNTER — Encounter: Payer: Self-pay | Admitting: Podiatry

## 2020-05-25 LAB — HEPATIC FUNCTION PANEL
AG Ratio: 1.5 (calc) (ref 1.0–2.5)
ALT: 4 U/L — ABNORMAL LOW (ref 6–29)
AST: 9 U/L — ABNORMAL LOW (ref 10–35)
Albumin: 4 g/dL (ref 3.6–5.1)
Alkaline phosphatase (APISO): 41 U/L (ref 31–125)
Bilirubin, Direct: 0 mg/dL (ref 0.0–0.2)
Globulin: 2.6 g/dL (calc) (ref 1.9–3.7)
Indirect Bilirubin: 0.2 mg/dL (calc) (ref 0.2–1.2)
Total Bilirubin: 0.2 mg/dL (ref 0.2–1.2)
Total Protein: 6.6 g/dL (ref 6.1–8.1)

## 2020-05-25 NOTE — Progress Notes (Signed)
  Subjective:  Patient ID: Melanie [name], female    DOB: 1975-06-01,  MRN: [id number]  Chief Complaint  Patient presents with  . Nail Problem    PT stated that she feels like the nail fungus is still there and would like more medication     45 y.o. female presents with the above complaint. History confirmed with patient.  She has completed the course of Lamisil, she would like to repeat the course again, she noted significant improvement but the nails have become discolored again since she stopped.  Objective:  Physical Exam: warm, good capillary refill, no trophic changes or ulcerative lesions, normal DP and PT pulses and normal sensory exam.  Unable to evaluate toenails due to nail polish   Assessment:   1. Onychomycosis      Plan:  Patient was evaluated and treated and all questions answered.   Discussed the etiology and treatment options for the condition in detail with the patient. Educated patient on the topical and oral treatment options for mycotic nails.  Recommended she try another full 32-month course of Lamisil.  Recommend she takes photos when she takes her nail polish off.  We also discussed the use of laser therapy if this does not work for her.  Order for LFT check written, she went for this today and her LFTs were within normal limits.  Begin Lamisil on April 1 so that she has had a 6-week break from her last course   Return in about 5 months (around 10/24/2020) for nail fungus re-check.

## 2020-10-24 ENCOUNTER — Ambulatory Visit: Payer: Medicare Other | Admitting: Podiatry

## 2021-01-13 ENCOUNTER — Ambulatory Visit (HOSPITAL_COMMUNITY)
Admission: EM | Admit: 2021-01-13 | Discharge: 2021-01-14 | Payer: Medicare Other | Attending: Behavioral Health | Admitting: Behavioral Health

## 2021-01-13 DIAGNOSIS — F333 Major depressive disorder, recurrent, severe with psychotic symptoms: Secondary | ICD-10-CM | POA: Insufficient documentation

## 2021-01-13 DIAGNOSIS — R45851 Suicidal ideations: Secondary | ICD-10-CM | POA: Diagnosis not present

## 2021-01-13 DIAGNOSIS — F419 Anxiety disorder, unspecified: Secondary | ICD-10-CM | POA: Insufficient documentation

## 2021-01-13 DIAGNOSIS — Z9889 Other specified postprocedural states: Secondary | ICD-10-CM | POA: Insufficient documentation

## 2021-01-13 DIAGNOSIS — Z87898 Personal history of other specified conditions: Secondary | ICD-10-CM | POA: Diagnosis not present

## 2021-01-13 DIAGNOSIS — Z20822 Contact with and (suspected) exposure to covid-19: Secondary | ICD-10-CM | POA: Diagnosis not present

## 2021-01-13 DIAGNOSIS — Z79899 Other long term (current) drug therapy: Secondary | ICD-10-CM | POA: Insufficient documentation

## 2021-01-13 LAB — POCT URINE DRUG SCREEN - MANUAL ENTRY (I-SCREEN)
POC Amphetamine UR: NOT DETECTED
POC Buprenorphine (BUP): NOT DETECTED
POC Cocaine UR: NOT DETECTED
POC Marijuana UR: NOT DETECTED
POC Methadone UR: NOT DETECTED
POC Methamphetamine UR: NOT DETECTED
POC Morphine: NOT DETECTED
POC Oxazepam (BZO): NOT DETECTED
POC Oxycodone UR: NOT DETECTED
POC Secobarbital (BAR): NOT DETECTED

## 2021-01-13 LAB — CBC WITH DIFFERENTIAL/PLATELET
Abs Immature Granulocytes: 0.02 10*3/uL (ref 0.00–0.07)
Basophils Absolute: 0.1 10*3/uL (ref 0.0–0.1)
Basophils Relative: 1 %
Eosinophils Absolute: 0.1 10*3/uL (ref 0.0–0.5)
Eosinophils Relative: 1 %
HCT: 43.7 % (ref 36.0–46.0)
Hemoglobin: 14.7 g/dL (ref 12.0–15.0)
Immature Granulocytes: 0 %
Lymphocytes Relative: 20 %
Lymphs Abs: 1.7 10*3/uL (ref 0.7–4.0)
MCH: 29.8 pg (ref 26.0–34.0)
MCHC: 33.6 g/dL (ref 30.0–36.0)
MCV: 88.5 fL (ref 80.0–100.0)
Monocytes Absolute: 0.4 10*3/uL (ref 0.1–1.0)
Monocytes Relative: 5 %
Neutro Abs: 6.2 10*3/uL (ref 1.7–7.7)
Neutrophils Relative %: 73 %
Platelets: 321 10*3/uL (ref 150–400)
RBC: 4.94 MIL/uL (ref 3.87–5.11)
RDW: 13.1 % (ref 11.5–15.5)
WBC: 8.5 10*3/uL (ref 4.0–10.5)
nRBC: 0 % (ref 0.0–0.2)

## 2021-01-13 LAB — COMPREHENSIVE METABOLIC PANEL
ALT: 12 U/L (ref 0–44)
AST: 13 U/L — ABNORMAL LOW (ref 15–41)
Albumin: 3.9 g/dL (ref 3.5–5.0)
Alkaline Phosphatase: 52 U/L (ref 38–126)
Anion gap: 7 (ref 5–15)
BUN: 11 mg/dL (ref 6–20)
CO2: 25 mmol/L (ref 22–32)
Calcium: 9.1 mg/dL (ref 8.9–10.3)
Chloride: 104 mmol/L (ref 98–111)
Creatinine, Ser: 0.63 mg/dL (ref 0.44–1.00)
GFR, Estimated: >60 mL/min (ref >60)
Glucose, Bld: 87 mg/dL (ref 70–99)
Potassium: 4.5 mmol/L (ref 3.5–5.1)
Sodium: 136 mmol/L (ref 135–145)
Total Bilirubin: 0.4 mg/dL (ref 0.3–1.2)
Total Protein: 6.9 g/dL (ref 6.5–8.1)

## 2021-01-13 LAB — RESP PANEL BY RT-PCR (FLU A&B, COVID) ARPGX2
Influenza A by PCR: NEGATIVE
Influenza B by PCR: NEGATIVE
SARS Coronavirus 2 by RT PCR: NEGATIVE

## 2021-01-13 LAB — LIPID PANEL
Cholesterol: 228 mg/dL — ABNORMAL HIGH (ref 0–200)
HDL: 59 mg/dL (ref >40)
LDL Cholesterol: 135 mg/dL — ABNORMAL HIGH (ref 0–99)
Total CHOL/HDL Ratio: 3.9 RATIO
Triglycerides: 169 mg/dL — ABNORMAL HIGH (ref <150)
VLDL: 34 mg/dL (ref 0–40)

## 2021-01-13 LAB — HEMOGLOBIN A1C
Hgb A1c MFr Bld: 5.2 % (ref 4.8–5.6)
Mean Plasma Glucose: 102.54 mg/dL

## 2021-01-13 LAB — POCT PREGNANCY, URINE: Preg Test, Ur: NEGATIVE

## 2021-01-13 LAB — ETHANOL: Alcohol, Ethyl (B): <10 mg/dL (ref <10)

## 2021-01-13 LAB — POC SARS CORONAVIRUS 2 AG -  ED: SARS Coronavirus 2 Ag: NEGATIVE

## 2021-01-13 LAB — TSH: TSH: 1.111 u[IU]/mL (ref 0.350–4.500)

## 2021-01-13 MED ORDER — ALUM & MAG HYDROXIDE-SIMETH 200-200-20 MG/5ML PO SUSP: 30.0000 mL | ORAL | Status: DC | PRN

## 2021-01-13 MED ORDER — HYDROXYZINE HCL 25 MG PO TABS: 25.0000 mg | ORAL_TABLET | Freq: Three times a day (TID) | ORAL | Status: DC | PRN

## 2021-01-13 MED ORDER — MAGNESIUM HYDROXIDE 400 MG/5ML PO SUSP: 30.0000 mL | Freq: Every day | ORAL | Status: DC | PRN

## 2021-01-13 MED ORDER — SERTRALINE HCL 25 MG PO TABS
25.0000 mg | ORAL_TABLET | Freq: Every day | ORAL | Status: DC
  Administered 2021-01-14: 25 mg via ORAL
  Filled 2021-01-13: qty 1

## 2021-01-13 MED ORDER — TRAZODONE HCL 50 MG PO TABS: 50.0000 mg | ORAL_TABLET | Freq: Every evening | ORAL | Status: DC | PRN

## 2021-01-13 MED ORDER — ACETAMINOPHEN 325 MG PO TABS: 650.0000 mg | ORAL_TABLET | Freq: Four times a day (QID) | ORAL | Status: DC | PRN

## 2021-01-13 NOTE — Progress Notes (Signed)
   01/13/21 0948  [place] (Walk-ins at [facility] only)  How Did You Hear About [place]? Self  What Is the Reason for Your Visit/Call Today? Patient is suicidal with plan to "overdose on the bottle of tylenol in my purse."  She is paranoid, believes family is plotting to kill her.  She is quite disorganized and struggles with giving pertinent history.  She adamantly denies having any mental health history or diagnoses, stating "I have no mental health problems, I am being attacked and I am afraid for my life."  Patient has been staying in her car for two days, after sister asked her to leave when she asked her sister to see a receipt.  She then states she does not want her sister or any family contacted, as they are all involved in this plot.  Patient is quite paranoid.  She also endorses AH, stating her thoughts were being broadcast on her hotel TV and TVs around the hotel.  She was also seeing floating faces.  How Long Has This Been Causing You Problems? 1-6 months  Have You Recently Had Any Thoughts About Hurting Yourself? Yes  How long ago did you have thoughts about hurting yourself? Today  Are You Planning to Commit Suicide/Harm Yourself At This time? Yes  Have you Recently Had Thoughts About Hurting Someone Guadalupe Dawn? No  Are You Planning To Harm Someone At This Time? No  Are you currently experiencing any auditory, visual or other hallucinations? Yes  Please explain the hallucinations you are currently experiencing: AH of thought broadcasting through TVs in hotel room and all other hotel rooms  Have You Used Any Alcohol or Drugs in the Past 24 Hours? No  Do you have any current medical co-morbidities that require immediate attention? No  Clinician description of patient physical appearance/behavior: Patient appears anxious and paranoid, stating she fears fro her life.  What Do You Feel Would Help You the Most Today? Treatment for Depression or other mood problem  If access to [facility] was  not available, would you have sought care in the Emergency Department? Yes  Determination of Need Emergent (2 hours)  Options For Referral Inpatient Hospitalization;Medication Management;BH Urgent Care

## 2021-01-13 NOTE — ED Notes (Signed)
Pt sleeping in bed in [place] adult side. Respirations even and unlabored with no signs of acute distress. Will continue to monitor for safety.

## 2021-01-13 NOTE — ED Notes (Signed)
Pt laying in bed in adult [place] during shift assessment. Tearful and cooperative during shift assessment. Pt A&Ox4 with no signs of acute distress. Pt states that she continues to be suicidal and depressed with a plan to take a bottle of tylenol pills or a bottle of potassium pills to end her life. She denies HI and AVH. Will continue to monitor for safety.

## 2021-01-13 NOTE — BH Assessment (Signed)
Comprehensive Clinical Assessment (CCA) Screening, Triage and Referral Note  01/13/2021 Melanie [name] [id number]  Disposition: Per Darrol Angel, NP inpatient treatment is recommended.  Disposition SW to pursue appropriate inpatient options.  The patient demonstrates the following risk factors for suicide: Chronic risk factors for suicide include: psychiatric disorder of MDD with psychotic fx and previous suicide attempts x1 approx 1 year ago by overdose - states she was admitted . Acute risk factors for suicide include: family or marital conflict, unemployment, and social withdrawal/isolation. Protective factors for this patient include: hope for the future. Considering these factors, the overall suicide risk at this point appears to be high. Patient is appropriate for outpatient follow up once stabilized.   Patient is a 45 year old female with a history of Major Depressive Disorder, with psychotic features who presents voluntarily to [facility] for assessment.  Patient is suicidal with plan to "overdose on the bottle of tylenol in my purse."  She is paranoid, believes family "and a lot of others" are plotting to kill her.  She is quite disorganized and struggles with giving pertinent history.  She adamantly denies having any mental health history or diagnoses, stating "I have no mental health problems, I am being attacked and I am afraid for my life."  Patient has been staying in her car for two days, after sister asked her to leave when she asked her sister to see a receipt.  She then states she does not want her sister or any family contacted, as they are all involved in this plot.  Patient is quite paranoid.  She also endorses AH, stating her thoughts were being broadcast on her hotel TV and TVs around the hotel.  She was also seeing floating faces.  Patient is not currently engaged in outpatient treatment.  She has one prior suicide attempt by overdose on 12/13/2018 and was  admitted to [facility] from 10/3-10/10/2018.  She has not been compliant with recommended medications, again stating she has no mental health diagnosis.  Patient is agreeable with inpatient treatment recommendation and states she is open to re-starting and anti-depressant.   Chief Complaint: No chief complaint on file.  Visit Diagnosis: Major Depressive Disorder, recurrent, severe with psychotic features (currently untreated)  Flowsheet Row ED from 01/13/2021 in [facility] ED from 03/06/2020 in [place] DEPT  Thoughts that you would be better off dead, or of hurting yourself in some way Several days More than half the days  PHQ-9 Total Score 17 [facility] ED from 01/13/2021 in [facility] ED from 03/06/2020 in [place] [facility] DEPT Admission (Discharged) from 12/13/2018 in [facility]  C-SSRS RISK CATEGORY High Risk High Risk High Risk        Patient Reported Information How did you hear about [place]? Self  What Is the Reason for Your Visit/Call Today? Patient is suicidal with plan to "overdose on the bottle of tylenol in my purse."  She is paranoid, believes family is plotting to kill her.  She is quite disorganized and struggles with giving pertinent history.  She adamantly denies having any mental health history or diagnoses, stating "I have no mental health problems, I am being attacked and I am afraid for my life."  Patient has been staying in her car for two days, after sister asked her to leave when she asked her sister to see a receipt.  She then states she does not want her sister or any family contacted, as they are all involved in this plot.  Patient is quite paranoid.  She also endorses AH, stating her thoughts were being broadcast on her hotel TV and TVs around the hotel.  She was also seeing floating faces.  How Long Has This Been  Causing You Problems? 1-6 months  What Do You Feel Would Help You the Most Today? Treatment for Depression or other mood problem   Have You Recently Had Any Thoughts About Hurting Yourself? Yes  Are You Planning to Commit Suicide/Harm Yourself At This time? Yes   Have you Recently Had Thoughts About Hurting Someone Guadalupe Dawn? No  Are You Planning to Harm Someone at This Time? No  Explanation: No data recorded  Have You Used Any Alcohol or Drugs in the Past 24 Hours? No  How Long Ago Did You Use Drugs or Alcohol? No data recorded What Did You Use and How Much? No data recorded  Do You Currently Have a Therapist/Psychiatrist? No  Name of Therapist/Psychiatrist: No data recorded  Have You Been Recently Discharged From Any Office Practice or Programs? No  Explanation of Discharge From Practice/Program: No data recorded   CCA Screening Triage Referral Assessment Type of Contact: Face-to-Face  Telemedicine Service Delivery:   Is this Initial or Reassessment? No data recorded Date Telepsych consult ordered in [facility]:  No data recorded Time Telepsych consult ordered in [facility]:  No data recorded Location of Assessment: [facility] Assessment Services  Provider Location: [facility] Assessment Services   Collateral Involvement: None - pt does not want anyone contacted, believes family is plotting against her.   Does Patient Have a Stage manager Guardian? No data recorded Name and Contact of Legal Guardian: No data recorded If Minor and Not Living with Parent(s), Who has Custody? No data recorded Is CPS involved or ever been involved? Never  Is APS involved or ever been involved? Never   Patient Determined To Be At Risk for Harm To Self or Others Based on Review of Patient Reported Information or Presenting Complaint? Yes, for Self-Harm  Method: No data recorded Availability of Means: No data recorded Intent: No data recorded Notification Required: No data recorded Additional  Information for Danger to Others Potential: No data recorded Additional Comments for Danger to Others Potential: No data recorded Are There Guns or Other Weapons in Your Home? No data recorded Types of Guns/Weapons: No data recorded Are These Weapons Safely Secured?                            No data recorded Who Could Verify You Are Able To Have These Secured: No data recorded Do You Have any Outstanding Charges, Pending Court Dates, Parole/Probation? No data recorded Contacted To Inform of Risk of Harm To Self or Others: Unable to Contact: (At this time, pt refuses to allow staff to contact any family)   Does Patient Present under Involuntary Commitment? No  IVC Papers Initial File Date: No data recorded  [place] of Residence: [place]   Patient Currently Receiving the Following Services: Not Receiving Services   Determination of Need: Emergent (2 hours)   Options For Referral: Inpatient Hospitalization; Medication Management; [facility]   Discharge Disposition:     [facility] [place], [facility]

## 2021-01-13 NOTE — ED Notes (Signed)
Patient arrived on unit was given meal and now secure on unit.

## 2021-01-13 NOTE — ED Provider Notes (Signed)
Behavioral Health Admission [facility]&P [facility])  Date: 01/13/21 Patient Name: Melanie [name] MRN: [id number] Diagnoses:  Final diagnoses:  Severe recurrent major depression with psychotic features [facility])  Suicidal ideation    HPI: Patient presents to the [facility] voluntarily as a walk-in unaccompanied  with a chief complaint of suicidal with plan to "overdose on the bottle of tylenol in my purse."    Patient seen and evaluated face-to-face by this provider, chart reviewed and case discussed with Dr. Dwyane Dee. On evaluation, patient is alert and oriented x4. Her thought process is disorganized with paranoia and delusional thought thought content. Her speech is clear and tangential. Her mood is dysphoric and affect is congruent. She appears disheveled.   She reports not feeling good today. She states, "I do not know where I am at. I noticed that the street signs are changing around me. When I was a child I was put on an airplane and when I got off I was on strange land."  She states that she is tired of fighting for her life and the only option is to swallow a bottle of pills. She states, I am ready to do it. I need relief."  She endorses suicidal ideations with a plan to overdose on pills. She denies having thoughts of wanting to hurt others.  She reports being able to hear people communicate. She states that the voices are saying "the ending is coming. They are about to celebrate someone's death."  She reports that she is seeing pin size holes in the wall and can hear them talking about her. She states, "evil is stalking me. I am under attack."    She reports feeling depressed since 2001 and describes her depression as feeling guilty, sadness, worthlessness, hopelessness, irritability, and decreased energy level.    She denies drinking alcohol or using illicit drugs. She denies prior psychiatric treatment. Patient was hospitalized at [facility] in 2020.  She reports a  history of a brain tumor and seizures 7 or 8 years ago.   PHQ 2-9:  [place] ED from 03/06/2020 in [place] DEPT  Thoughts that you would be better off dead, or of hurting yourself in some way More than half the days  PHQ-9 Total Score 14       Flowsheet Row ED from 03/06/2020 in [place] DEPT Admission (Discharged) from 12/13/2018 in [place] [facility] ED from 12/12/2018 in [place] [facility]PT  C-SSRS RISK CATEGORY High Risk High Risk High Risk        Total Time spent with patient: 30 minutes  Musculoskeletal  Strength & Muscle Tone: within normal limits Gait & Station: normal Patient leans: N/A  Psychiatric Specialty Exam  Presentation General Appearance: Disheveled  Eye Contact:Minimal  Speech:Pressured; Clear and Coherent  Speech Volume:Increased  Handedness:Right   Mood and Affect  Mood:Dysphoric  Affect:Congruent   Thought Process  Thought Processes:Disorganized  Descriptions of Associations:Circumstantial  Orientation:Full (Time, Place and Person)  Thought Content:Tangential; Paranoid Ideation  Diagnosis of Schizophrenia or Schizoaffective disorder in past: No  Duration of Psychotic Symptoms: Less than six months  Hallucinations:Hallucinations: Visual; Auditory  Ideas of Reference:None  Suicidal Thoughts:Suicidal Thoughts: Yes, Active SI Active Intent and/or Plan: With Plan; With Means to [place]; With Intent  Homicidal Thoughts:Homicidal Thoughts: No   Sensorium  Memory:Immediate Fair; Recent Fair; Remote [place]   Executive Functions  Concentration:Poor  Attention Span:Poor  Recall:Poor  Fund of Knowledge:Fair  Language:Fair   Psychomotor Activity  Psychomotor Activity:Psychomotor Activity: Normal   Assets  Assets:Communication Skills; Desire for Improvement   Sleep   Sleep:Sleep: Fair    Physical Exam Constitutional:      Appearance: Normal appearance.  HENT:     Head: Normocephalic.     Nose: Nose normal.  Eyes:     Conjunctiva/sclera: Conjunctivae normal.  Cardiovascular:     Rate and Rhythm: Normal rate.  Pulmonary:     Effort: Pulmonary effort is normal.  Musculoskeletal:        General: Normal range of motion.     Cervical back: Normal range of motion.  Neurological:     Mental Status: She is alert and oriented to person, place, and time.   Review of Systems  Constitutional: Negative.   HENT: Negative.    Eyes: Negative.   Respiratory: Negative.    Cardiovascular: Negative.   Gastrointestinal: Negative.   Genitourinary: Negative.   Musculoskeletal: Negative.   Skin: Negative.   Neurological: Negative.   Endo/Heme/Allergies: Negative.   Psychiatric/Behavioral:  Positive for depression, hallucinations and suicidal ideas.    Blood pressure (!) 137/97, pulse 88, temperature 97.9 F (36.6 C), temperature source Oral, resp. rate 18, SpO2 100 %. There is no height or weight on file to calculate BMI.  Past Psychiatric History: Patient was hospitalized at [facility] in October 2020 for major depressive disorder with psychotic features. Patient was prescribed Abilify 15 mg p.o. daily and Cymbalta DR 60 mg p.o. daily.  Is the patient at risk to self? Yes  Has the patient been a risk to self in the past 6 months? Unknown  Has the patient been a risk to self within the distant past? Yes   Is the patient a risk to others? No   Has the patient been a risk to others in the past 6 months? Unknown  Has the patient been a risk to others within the distant past? Unknown   Past Medical History:  Past Medical History:  Diagnosis Date   Abdominal pain    Anxiety    pt denies - noted in several MD/hosp notes    Asthma    hx of as teenager; induced with running now    Blood in urine    Brain tumor ([place]) 2011   Chills     Clotting disorder ([place])    Complication of anesthesia    pt states is severe    Confusion    Depression    noted in several past MD notes/hosp notes pt to have major depressive disorder - pt denies    Embolism - blood clot in left ovary   Gallstones    Headache    rarely has but currently has one this am    Meningioma ([place]) 08/14/2011   S/p craniotomy Encephaloma present non changing on MRI in 08/2011   Nausea    Palpitations    PONV (postoperative nausea and vomiting)    Rectal bleeding    Rectal pain    Seizures ([place])    last one 2011   TIA (transient ischemic attack)    Unintentional weight loss    Visual disturbance    Weakness     Past Surgical History:  Procedure Laterality Date   BRAIN SURGERY     brain tumor removed  2011   CHOLECYSTECTOMY  12/03/2011   Procedure: LAPAROSCOPIC CHOLECYSTECTOMY WITH INTRAOPERATIVE CHOLANGIOGRAM;  Surgeon: Madilyn Hook, DO;  Location: [facility]  OR;  Service: General;  Laterality: N/A;   LAPAROSCOPIC OVARIAN CYSTECTOMY Bilateral 08/24/2014   Procedure: XI ROBOTIC ASSISTED BILATERAL LAPAROSCOPIC OVARIAN CYSTECTOMY RESECTION OF ENDOMETRIAL CYST;  Surgeon: Everitt Amber, MD;  Location: [facility] ORS;  Service: Gynecology;  Laterality: Bilateral;   ovarian mass removed  2010   left    Family History:  Family History  Problem Relation Age of Onset   Depression Mother    Heart failure Father    Hypertension Father    Heart disease Father    Alcohol abuse Sister    Alcohol abuse Brother    Alzheimer's disease Maternal Grandmother    Diabetes Maternal Grandmother    Stroke Maternal Grandmother     Social History:  Social History   Socioeconomic History   Marital status: Divorced    Spouse name: Not on file   Number of children: Not on file   Years of education: Not on file   Highest education level: Not on file  Occupational History   Not on file  Tobacco Use   Smoking status: Never   Smokeless tobacco: Never  Substance and Sexual Activity    Alcohol use: No   Drug use: No   Sexual activity: Not Currently  Other Topics Concern   Not on file  Social History Narrative   Not on file   Social Determinants of Health   Financial Resource Strain: Not on file  Food Insecurity: Not on file  Transportation Needs: Not on file  Physical Activity: Not on file  Stress: Not on file  Social Connections: Not on file  Intimate Partner Violence: Not on file    SDOH:  SDOH Screenings   Alcohol Screen: Not on file  Depression (PHQ2-9): Medium Risk   PHQ-2 Score: 14  Financial Resource Strain: Not on file  Food Insecurity: Not on file  Housing: Not on file  Physical Activity: Not on file  Social Connections: Not on file  Stress: Not on file  Tobacco Use: Low Risk    Smoking Tobacco Use: Never   Smokeless Tobacco Use: Never   Passive Exposure: Not on file  Transportation Needs: Not on file    Last Labs:  No visits with results within 6 Month(s) from this visit.  Latest known visit with results is:  Office Visit on 05/24/2020  Component Date Value Ref Range Status   Total Protein 05/24/2020 6.6  6.1 - 8.1 g/dL Final   Albumin 05/24/2020 4.0  3.6 - 5.1 g/dL Final   Globulin 05/24/2020 2.6  1.9 - 3.7 g/dL (calc) Final   AG Ratio 05/24/2020 1.5  1.0 - 2.5 (calc) Final   Total Bilirubin 05/24/2020 0.2  0.2 - 1.2 mg/dL Final   Bilirubin, Direct 05/24/2020 0.0  0.0 - 0.2 mg/dL Final   Indirect Bilirubin 05/24/2020 0.2  0.2 - 1.2 mg/dL (calc) Final   Alkaline phosphatase (APISO) 05/24/2020 41  31 - 125 U/L Final   AST 05/24/2020 9 (A)  10 - 35 U/L Final   ALT 05/24/2020 4 (A)  6 - 29 U/L Final    Allergies: Morphine and related  PTA Medications: (Not in a hospital admission)   Medical Decision Making  Patient admitted to the [facility] continuous assessment for mood stabilization and safety. Patient is recommended for inpatient treatment. Patient is voluntary.   Lab Orders         Resp Panel by RT-PCR (Flu A&B, Covid)  Nasopharyngeal Swab         CBC with  Differential/Platelet         Comprehensive metabolic panel         Hemoglobin A1c         Ethanol         Lipid panel         TSH         POCT Urine Drug Screen - ([facility])         POC SARS Coronavirus 2 Ag-ED - Nasal Swab    EKG   Medications recommendations:  I discussed restarting Abilify with the patient for for depression and hallucinations. Patient refuses. She states that Abilify caused her to have jerks in the past.  I discussed with the patient starting Zyprexa for mood stabilization and hallucinations. Patient refuses. States that she will only take an antidepressant.   I discussed with the patient restarting Cymbalta for depression. Patient refuses and states that the medication caused her to have jerks in the past.  Patient states she will only take an antidepressant and only agrees to take Zoloft at this time.  Zoloft 25 mg by mouth daily for depression initiated. I discussed the risk and benefits of taking Zoloft for depression with the patient. It is unclear if the patient understands the education provided. However she agrees to taking Zoloft at this time. Recommendations  Based on my evaluation the patient does not appear to have an emergency medical condition.  Marissa Calamity, NP 01/13/21  10:56 AM

## 2021-01-14 DIAGNOSIS — R45851 Suicidal ideations: Secondary | ICD-10-CM | POA: Diagnosis not present

## 2021-01-14 DIAGNOSIS — F333 Major depressive disorder, recurrent, severe with psychotic symptoms: Secondary | ICD-10-CM | POA: Diagnosis not present

## 2021-01-14 NOTE — ED Notes (Signed)
Pt sleeping in [facility] adult side. Respirations even and unlabored with no signs of acute distress. Will continue to monitor for safety.

## 2021-01-14 NOTE — Discharge Instructions (Addendum)
Patient will be transferred to [facility] for inpatient psychiatric admission.

## 2021-01-14 NOTE — Discharge Summary (Signed)
Pt was discharged to [facility] via [facility].   She was alert and oriented at time of transfer. Contracted for safety while in transport.  Pt was given AVS and gone over this with staff.   Voiced no questions.  No distress noted at time of discharge. All belongings returned  from locker 26.

## 2021-01-14 NOTE — Progress Notes (Signed)
CSW spoke with Melanie [name] at [employer] [phone]. He confirmed that they would take pt to [facility], as CSW noted that it is 64.9 miles away from [facility] not 75 (according to [employer]). He asked that [name] call his number at 3:00PM, as it will be shift change for him and he is unable to come right now due to being in [place]. He said to give pt's name/info to the driver that answers and she will transport TODAY, 11/5 to [facility].  Melanie [name] and Melanie [name] notified of this and given above information. CSW contacted Melanie [name] in admissions at [facility] to make sure bed is still available and to inform her that pt will be able to arrive today. She confirmed that bed is being held.   Melanie Bost S. Melanie [name], MSW, LCSW Clinical Social Worker 01/14/2021 2:32 PM

## 2021-01-14 NOTE — Progress Notes (Addendum)
Pt has been accepted for admission to [facility] per "[place]" in admissions. She may arrive at any time today. Pt will go to the [facility] unit. Number for report is: [phone]. Accepting provider is Dr. He. CSW notified Velva Harman RN and Thomes Lolling NP. Velva Harman is informing pt of admission status to determine pt willingness, as pt is voluntary at this point.   Nicolai Labonte S. Ouida Sills, MSW, LCSW Clinical Social Worker 01/14/2021 11:06 AM   Pt has accepted bed. RN to [facility] manager.  Tyrell Brereton S. Ouida Sills, MSW, LCSW Clinical Social Worker 01/14/2021 11:11 AM

## 2021-01-14 NOTE — Progress Notes (Signed)
Patient meets criteria for inpatient treatment per Thomes Lolling NP. There are no available beds at [facility] at this time. CSW faxed referrals to the following facilities for review:  [facility]  TTS will continue to seek bed placement.   Maxie Better, MSW, LCSW Clinical Social Worker 01/14/2021 10:49 AM

## 2021-01-14 NOTE — ED Provider Notes (Signed)
Behavioral Health Progress Note  Date and Time: 01/14/2021 1:12 PM Name: Melanie [name] MRN:  [id number]  Subjective:  Melanie [name], 45 y.o., female patient seen face to face by this provider, chart reviewed and discussed with Dr. Dwyane Dee on 01/14/21.  Patient was seen in [facility] on 01/13/2021 and was admitted to the observation unit for continuous assessment.  Patient reports multiple inpatient psychiatric admissions.  Reports she has a psychiatric history of depression and anxiety.  On today's assessment Melanie [name] is sitting in her bed in no acute distress.  She makes fleeting eye contact and her speech is clear, coherent, normal rate and tone.  She is alert and oriented x4 and cooperative.  She is tearful at times throughout the assessment.  She endorses depression and anxiety with a congruent affect.  She rocks back and forth at times.  She self isolates.  Reports she does not sleep well and her appetite is increased due to her depression.  Patient denies auditory visual hallucinations.  She endorses paranoia, states, "I am being attacked, followed, and stalked".  She continues to endorse suicidal ideations with a plan to overdose on medications.  Patient has intent, plan, and access to means.  She cannot contract for safety at this time.  States, "I am going to die if someone does not help me".  Reports she has no support, no family or friends.  States that she sleeps in her car some nights but had been staying at her sisters house. Patient reports tolerating medications without adverse reactions. Reassurance, support, and encouragement provided.   Patient meets criteria for inpatient psychiatric admission.  Patient agreed.  [facility] had no available beds.  Social worker was contacted and patient was faxed out.  Patient was accepted to [facility], but due to no transportation to [place] this weekend patient will remain in the observation unit.  [facility] will hold the bed  for 24 hours. social worker will contact [facility] in the a.m.  Discussed with patient that if she were to be involuntarily committed that she could be transported today by Event organiser.  Patient states that she does not want to be involuntarily committed and that she will wait for regular transport.  Patient is voluntary and she is not threatening to leave.  She has been calm and cooperative on the unit.  Patient does not meet criteria for this writer to involuntarily commit her at this time.  Diagnosis:  Final diagnoses:  Severe recurrent major depression with psychotic features ([place])  Suicidal ideation    Total Time spent with patient: 30 minutes  Past Psychiatric History: see H&P Past Medical History:  Past Medical History:  Diagnosis Date   Abdominal pain    Anxiety    pt denies - noted in several MD/hosp notes    Asthma    hx of as teenager; induced with running now    Blood in urine    Brain tumor ([place]) 2011   Chills    Clotting disorder ([place])    Complication of anesthesia    pt states is severe    Confusion    Depression    noted in several past MD notes/hosp notes pt to have major depressive disorder - pt denies    Embolism - blood clot in left ovary   Gallstones    Headache    rarely has but currently has one this am    Meningioma ([place]) 08/14/2011   S/p craniotomy Encephaloma  present non changing on MRI in 08/2011   Nausea    Palpitations    PONV (postoperative nausea and vomiting)    Rectal bleeding    Rectal pain    Seizures ([place])    last one 2011   TIA (transient ischemic attack)    Unintentional weight loss    Visual disturbance    Weakness     Past Surgical History:  Procedure Laterality Date   BRAIN SURGERY     brain tumor removed  2011   CHOLECYSTECTOMY  12/03/2011   Procedure: LAPAROSCOPIC CHOLECYSTECTOMY WITH INTRAOPERATIVE CHOLANGIOGRAM;  Surgeon: Madilyn Hook, DO;  Location: [facility];  Service: General;  Laterality: N/A;   LAPAROSCOPIC  OVARIAN CYSTECTOMY Bilateral 08/24/2014   Procedure: XI ROBOTIC ASSISTED BILATERAL LAPAROSCOPIC OVARIAN CYSTECTOMY RESECTION OF ENDOMETRIAL CYST;  Surgeon: Everitt Amber, MD;  Location: [facility];  Service: Gynecology;  Laterality: Bilateral;   ovarian mass removed  2010   left   Family History:  Family History  Problem Relation Age of Onset   Depression Mother    Heart failure Father    Hypertension Father    Heart disease Father    Alcohol abuse Sister    Alcohol abuse Brother    Alzheimer's disease Maternal Grandmother    Diabetes Maternal Grandmother    Stroke Maternal Grandmother    Family Psychiatric  History: See H&P Social History:  Social History   Substance and Sexual Activity  Alcohol Use No     Social History   Substance and Sexual Activity  Drug Use No    Social History   Socioeconomic History   Marital status: Divorced    Spouse name: Not on file   Number of children: Not on file   Years of education: Not on file   Highest education level: Not on file  Occupational History   Not on file  Tobacco Use   Smoking status: Never   Smokeless tobacco: Never  Substance and Sexual Activity   Alcohol use: No   Drug use: No   Sexual activity: Not Currently  Other Topics Concern   Not on file  Social History Narrative   Not on file   Social Determinants of Health   Financial Resource Strain: Not on file  Food Insecurity: Not on file  Transportation Needs: Not on file  Physical Activity: Not on file  Stress: Not on file  Social Connections: Not on file   SDOH:  SDOH Screenings   Alcohol Screen: Not on file  Depression (PHQ2-9): Medium Risk   PHQ-2 Score: 17  Financial Resource Strain: Not on file  Food Insecurity: Not on file  Housing: Not on file  Physical Activity: Not on file  Social Connections: Not on file  Stress: Not on file  Tobacco Use: Low Risk    Smoking Tobacco Use: Never   Smokeless Tobacco Use: Never   Passive Exposure: Not on file   Transportation Needs: Not on file   Additional Social History:    Pain Medications: Please see MAR Prescriptions: Please see MAR Over the Counter: Please see MAR History of alcohol / drug use?: No history of alcohol / drug abuse Longest period of sobriety (when/how long): NA                    Sleep: Fair  Appetite:  Fair  Current Medications:  Current Facility-Administered Medications  Medication Dose Route Frequency Provider Last Rate Last Admin   acetaminophen (TYLENOL) tablet 650 mg  650 mg Oral Q6H PRN White, Patrice L, NP       alum & mag hydroxide-simeth (MAALOX/MYLANTA) 200-200-20 MG/5ML suspension 30 mL  30 mL Oral Q4H PRN White, Patrice L, NP       hydrOXYzine (ATARAX/VISTARIL) tablet 25 mg  25 mg Oral TID PRN White, Patrice L, NP       magnesium hydroxide (MILK OF MAGNESIA) suspension 30 mL  30 mL Oral Daily PRN White, Patrice L, NP       sertraline (ZOLOFT) tablet 25 mg  25 mg Oral Daily White, Patrice L, NP   25 mg at 01/14/21 0912   traZODone (DESYREL) tablet 50 mg  50 mg Oral QHS PRN White, Patrice L, NP       No current outpatient medications on file.    Labs  Lab Results:  Admission on 01/13/2021  Component Date Value Ref Range Status   SARS Coronavirus 2 by RT PCR 01/13/2021 NEGATIVE  NEGATIVE Final   Comment: (NOTE) SARS-CoV-2 target nucleic acids are NOT DETECTED.  The SARS-CoV-2 RNA is generally detectable in upper respiratory specimens during the acute phase of infection. The lowest concentration of SARS-CoV-2 viral copies this assay can detect is 138 copies/mL. A negative result does not preclude SARS-Cov-2 infection and should not be used as the sole basis for treatment or other patient management decisions. A negative result may occur with  improper specimen collection/handling, submission of specimen other than nasopharyngeal swab, presence of viral mutation(s) within the areas targeted by this assay, and inadequate number of  viral copies(<138 copies/mL). A negative result must be combined with clinical observations, patient history, and epidemiological information. The expected result is Negative.  Fact Sheet for Patients:  EntrepreneurPulse.com.au  Fact Sheet for Healthcare Providers:  IncredibleEmployment.be  This test is no                          t yet approved or cleared by the [place] FDA and  has been authorized for detection and/or diagnosis of SARS-CoV-2 by FDA under an Emergency Use Authorization (EUA). This EUA will remain  in effect (meaning this test can be used) for the duration of the COVID-19 declaration under Section 564(b)(1) of the Act, 21 U.S.C.section 360bbb-3(b)(1), unless the authorization is terminated  or revoked sooner.       Influenza A by PCR 01/13/2021 NEGATIVE  NEGATIVE Final   Influenza B by PCR 01/13/2021 NEGATIVE  NEGATIVE Final   Comment: (NOTE) The Xpert Xpress SARS-CoV-2/FLU/RSV plus assay is intended as an aid in the diagnosis of influenza from Nasopharyngeal swab specimens and should not be used as a sole basis for treatment. Nasal washings and aspirates are unacceptable for Xpert Xpress SARS-CoV-2/FLU/RSV testing.  Fact Sheet for Patients: EntrepreneurPulse.com.au  Fact Sheet for Healthcare Providers: IncredibleEmployment.be  This test is not yet approved or cleared by the [place] FDA and has been authorized for detection and/or diagnosis of SARS-CoV-2 by FDA under an Emergency Use Authorization (EUA). This EUA will remain in effect (meaning this test can be used) for the duration of the COVID-19 declaration under Section 564(b)(1) of the Act, 21 U.S.C. section 360bbb-3(b)(1), unless the authorization is terminated or revoked.  Performed at [facility], [place] [address] [place], [place] [address]    WBC 01/13/2021 8.5  4.0 - 10.5 K/uL Final   RBC 01/13/2021 4.94   3.87 - 5.11 MIL/uL Final   Hemoglobin 01/13/2021 14.7  12.0 - 15.0  g/dL Final   HCT 01/13/2021 43.7  36.0 - 46.0 % Final   MCV 01/13/2021 88.5  80.0 - 100.0 fL Final   MCH 01/13/2021 29.8  26.0 - 34.0 pg Final   MCHC 01/13/2021 33.6  30.0 - 36.0 g/dL Final   RDW 01/13/2021 13.1  11.5 - 15.5 % Final   Platelets 01/13/2021 321  150 - 400 K/uL Final   nRBC 01/13/2021 0.0  0.0 - 0.2 % Final   Neutrophils Relative % 01/13/2021 73  % Final   Neutro Abs 01/13/2021 6.2  1.7 - 7.7 K/uL Final   Lymphocytes Relative 01/13/2021 20  % Final   Lymphs Abs 01/13/2021 1.7  0.7 - 4.0 K/uL Final   Monocytes Relative 01/13/2021 5  % Final   Monocytes Absolute 01/13/2021 0.4  0.1 - 1.0 K/uL Final   Eosinophils Relative 01/13/2021 1  % Final   Eosinophils Absolute 01/13/2021 0.1  0.0 - 0.5 K/uL Final   Basophils Relative 01/13/2021 1  % Final   Basophils Absolute 01/13/2021 0.1  0.0 - 0.1 K/uL Final   Immature Granulocytes 01/13/2021 0  % Final   Abs Immature Granulocytes 01/13/2021 0.02  0.00 - 0.07 K/uL Final   Performed at [facility], [place] [address], [place], [place] [address]   Sodium 01/13/2021 136  135 - 145 mmol/L Final   Potassium 01/13/2021 4.5  3.5 - 5.1 mmol/L Final   Chloride 01/13/2021 104  98 - 111 mmol/L Final   CO2 01/13/2021 25  22 - 32 mmol/L Final   Glucose, Bld 01/13/2021 87  70 - 99 mg/dL Final   Glucose reference range applies only to samples taken after fasting for at least 8 hours.   BUN 01/13/2021 11  6 - 20 mg/dL Final   Creatinine, Ser 01/13/2021 0.63  0.44 - 1.00 mg/dL Final   Calcium 01/13/2021 9.1  8.9 - 10.3 mg/dL Final   Total Protein 01/13/2021 6.9  6.5 - 8.1 g/dL Final   Albumin 01/13/2021 3.9  3.5 - 5.0 g/dL Final   AST 01/13/2021 13 (A)  15 - 41 U/L Final   ALT 01/13/2021 12  0 - 44 U/L Final   Alkaline Phosphatase 01/13/2021 52  38 - 126 U/L Final   Total Bilirubin 01/13/2021 0.4  0.3 - 1.2 mg/dL Final   GFR, Estimated 01/13/2021 >60  >60 mL/min Final    Comment: (NOTE) Calculated using the CKD-EPI Creatinine Equation (2021)    Anion gap 01/13/2021 7  5 - 15 Final   Performed at [facility] [address] [place], [place] [address]   Hgb A1c MFr Bld 01/13/2021 5.2  4.8 - 5.6 % Final   Comment: (NOTE) Pre diabetes:          5.7%-6.4%  Diabetes:              >6.4%  Glycemic control for   <7.0% adults with diabetes    Mean Plasma Glucose 01/13/2021 102.54  mg/dL Final   Performed at [facility], [place] [address], [place], [place] [address]   Alcohol, Ethyl (B) 01/13/2021 <10  <10 mg/dL Final   Comment: (NOTE) Lowest detectable limit for serum alcohol is 10 mg/dL.  For medical purposes only. Performed at [facility], [place] [address], [place], [place] [address]    Cholesterol 01/13/2021 228 (A)  0 - 200 mg/dL Final   Triglycerides 01/13/2021 169 (A)  <150 mg/dL Final   HDL 01/13/2021 59  >  40 mg/dL Final   Total CHOL/HDL Ratio 01/13/2021 3.9  RATIO Final   VLDL 01/13/2021 34  0 - 40 mg/dL Final   LDL Cholesterol 01/13/2021 135 (A)  0 - 99 mg/dL Final   Comment:        Total Cholesterol/HDL:CHD Risk Coronary Heart Disease Risk Table                     Men   Women  1/2 Average Risk   3.4   3.3  Average Risk       5.0   4.4  2 X Average Risk   9.6   7.1  3 X Average Risk  23.4   11.0        Use the calculated Patient Ratio above and the CHD Risk Table to determine the patient's CHD Risk.        ATP III CLASSIFICATION (LDL):  <100     mg/dL   Optimal  100-129  mg/dL   Near or Above                    Optimal  130-159  mg/dL   Borderline  160-189  mg/dL   High  >190     mg/dL   Very High Performed at [facility] [address] [place], [place] [address]    TSH 01/13/2021 1.111  0.350 - 4.500 uIU/mL Final   Comment: Performed by a 3rd Generation assay with a functional sensitivity of <=0.01 uIU/mL. Performed at [facility], [place] [address], [place], [place] [address]    POC Amphetamine  UR 01/13/2021 None Detected  NONE DETECTED (Cut Off Level 1000 ng/mL) Final   POC Secobarbital (BAR) 01/13/2021 None Detected  NONE DETECTED (Cut Off Level 300 ng/mL) Final   POC Buprenorphine (BUP) 01/13/2021 None Detected  NONE DETECTED (Cut Off Level 10 ng/mL) Final   POC Oxazepam (BZO) 01/13/2021 None Detected  NONE DETECTED (Cut Off Level 300 ng/mL) Final   POC Cocaine UR 01/13/2021 None Detected  NONE DETECTED (Cut Off Level 300 ng/mL) Final   POC Methamphetamine UR 01/13/2021 None Detected  NONE DETECTED (Cut Off Level 1000 ng/mL) Final   POC Morphine 01/13/2021 None Detected  NONE DETECTED (Cut Off Level 300 ng/mL) Final   POC Oxycodone UR 01/13/2021 None Detected  NONE DETECTED (Cut Off Level 100 ng/mL) Final   POC Methadone UR 01/13/2021 None Detected  NONE DETECTED (Cut Off Level 300 ng/mL) Final   POC Marijuana UR 01/13/2021 None Detected  NONE DETECTED (Cut Off Level 50 ng/mL) Final   SARS Coronavirus 2 Ag 01/13/2021 Negative  Negative Final   Preg Test, Ur 01/13/2021 NEGATIVE  NEGATIVE Final   Comment:        THE SENSITIVITY OF THIS METHODOLOGY IS >24 mIU/mL     Blood Alcohol level:  Lab Results  Component Value Date   ETH <10 01/13/2021   ETH <10 82/50/0370    Metabolic Disorder Labs: Lab Results  Component Value Date   HGBA1C 5.2 01/13/2021   MPG 102.54 01/13/2021   MPG 100 08/27/2013   No results found for: PROLACTIN Lab Results  Component Value Date   CHOL 228 (H) 01/13/2021   TRIG 169 (H) 01/13/2021   HDL 59 01/13/2021   CHOLHDL 3.9 01/13/2021   VLDL 34 01/13/2021   LDLCALC 135 (H) 01/13/2021   LDLCALC 177 (H) 12/13/2018    Therapeutic Lab Levels: No results found for: LITHIUM  No results found for: VALPROATE No components found for:  CBMZ  Physical Findings   AIMS    Flowsheet Row Admission (Discharged) from 12/13/2018 in [facility]  AIMS Total Score 0      AUDIT    Flowsheet Row Admission (Discharged)  from 12/13/2018 in [facility]  Alcohol Use Disorder Identification Test Final Score (AUDIT) 1      PHQ2-9    Flowsheet Row ED from 01/13/2021 in [facility] ED from 03/06/2020 in [facility]  PHQ-2 Total Score 5 3  PHQ-9 Total Score 17 14      Flowsheet Row ED from 01/13/2021 in [facility] ED from 03/06/2020 in [facility] Admission (Discharged) from 12/13/2018 in [facility]  C-SSRS RISK CATEGORY High Risk High Risk High Risk        Musculoskeletal  Strength & Muscle Tone: within normal limits Gait & Station: normal Patient leans: N/A  Psychiatric Specialty Exam  Presentation  General Appearance: Disheveled; Appropriate for Environment  Eye Contact:Fleeting  Speech:Clear and Coherent; Normal Rate  Speech Volume:Normal  Handedness:Right   Mood and Affect  Mood:Anxious; Depressed  Affect:Congruent   Thought Process  Thought Processes:Coherent  Descriptions of Associations:Intact  Orientation:Full (Time, Place and Person)  Thought Content:Paranoid Ideation; Tangential  Diagnosis of Schizophrenia or Schizoaffective disorder in past: No  Duration of Psychotic Symptoms: Less than six months   Hallucinations:Hallucinations: Auditory Description of Auditory Hallucinations: would not elaborate  Ideas of Reference:Paranoia  Suicidal Thoughts:Suicidal Thoughts: Yes, Active SI Active Intent and/or Plan: Without Intent; With Plan; With Means to [place]  Homicidal Thoughts:Homicidal Thoughts: No   Sensorium  Memory:Immediate Fair; Recent Fair; Remote Fair  Judgment:Impaired  Insight:Lacking   Executive Functions  Concentration:Fair  Attention Span:Fair  [place]   Psychomotor Activity  Psychomotor  Activity:Psychomotor Activity: Normal   Assets  Assets:Desire for Improvement; Leisure Time; Physical Health; Resilience   Sleep  Sleep:Sleep: Fair   No data recorded  Physical Exam  Physical Exam Vitals and nursing note reviewed.  Constitutional:      General: She is not in acute distress.    Appearance: Normal appearance. She is not ill-appearing.  HENT:     Head: Normocephalic.  Eyes:     Pupils: Pupils are equal, round, and reactive to light.  Cardiovascular:     Rate and Rhythm: Normal rate.  Pulmonary:     Effort: Pulmonary effort is normal.  Musculoskeletal:        General: Normal range of motion.     Cervical back: Normal range of motion.  Skin:    General: Skin is warm and dry.  Neurological:     Mental Status: She is alert and oriented to person, place, and time.  Psychiatric:        Attention and Perception: Attention and perception normal.        Mood and Affect: Mood is anxious and depressed. Affect is tearful.        Speech: Speech normal.        Behavior: Behavior is withdrawn. Behavior is cooperative.        Thought Content: Thought content is paranoid. Thought content includes suicidal ideation. Thought content includes suicidal plan.        Cognition and Memory: Cognition normal.        Judgment: Judgment is impulsive.  Review of Systems  Constitutional: Negative.   HENT: Negative.    Eyes: Negative.   Respiratory: Negative.    Cardiovascular: Negative.   Musculoskeletal: Negative.   Skin: Negative.   Neurological: Negative.   Psychiatric/Behavioral:  Positive for depression and suicidal ideas. The patient is nervous/anxious.   Blood pressure 122/89, pulse 80, temperature 98.4 F (36.9 C), resp. rate 18, SpO2 98 %. There is no height or weight on file to calculate BMI.  Treatment Plan Summary:  Patient meets criteria for inpatient psychiatric admission.  Patient cannot contract for safety. Daily contact with patient to assess and evaluate  symptoms and progress in treatment and Medication management  [facility] contacted and they have no available beds.  Social worker contacted and patient was faxed out.  Patient has a pending bed at [facility].  However safe transport will not transport today, they cannot transport until Monday.  Social worker contacted [facility] and they will hold the bed for 24 hours, SW will contact [facility] again in the a.m.  Lab work reviewed on chart review completed, UDS negative  Revonda Humphrey, NP 01/14/2021 1:12 PM

## 2021-01-14 NOTE — ED Provider Notes (Signed)
FBC/OBS ASAP Discharge Summary  Date and Time: 01/14/2021 2:55 PM  Name: Melanie [name]  MRN:  [id number]   Discharge Diagnoses:  Final diagnoses:  Severe recurrent major depression with psychotic features [facility])  Suicidal ideation    Subjective:  Progress note from this writer: [name], 45 y.o., female patient seen face to face by this provider, chart reviewed and discussed with Dr. Dwyane Dee on 01/14/21.  Patient was seen in [facility] on 01/13/2021 and was admitted to the observation unit for continuous assessment.  Patient reports multiple inpatient psychiatric admissions.  Reports she has a psychiatric history of depression and anxiety.   On today's assessment Melanie [name] is sitting in her bed in no acute distress.  She makes fleeting eye contact and her speech is clear, coherent, normal rate and tone.  She is alert and oriented x4 and cooperative.  She is tearful at times throughout the assessment.  She endorses depression and anxiety with a congruent affect.  She rocks back and forth at times.  She self isolates.  Reports she does not sleep well and her appetite is increased due to her depression.  Patient denies auditory visual hallucinations.  She endorses paranoia, states, "I am being attacked, followed, and stalked".  She continues to endorse suicidal ideations with a plan to overdose on medications.  Patient has intent, plan, and access to means.  She cannot contract for safety at this time.  States, "I am going to die if someone does not help me".  Reports she has no support, no family or friends.  States that she sleeps in her car some nights but had been staying at her sisters house. Patient reports tolerating medications without adverse reactions. Reassurance, support, and encouragement provided.     Stay Summary:   Patient meets criteria for inpatient psychiatric admission.  Patient agreed.  [facility] had no available beds.  Social worker was contacted and patient was  faxed out.  Patient was accepted to [facility], originally there was no transportation. Social worker contacted [employer] again and was able to arrange. Patient will be transferred today to [facility].  Total Time spent with patient: 30 minutes  Past Psychiatric History: see admission H&P Past Medical History:  Past Medical History:  Diagnosis Date   Abdominal pain    Anxiety    pt denies - noted in several MD/hosp notes    Asthma    hx of as teenager; induced with running now    Blood in urine    Brain tumor ([place]) 2011   Chills    Clotting disorder ([place])    Complication of anesthesia    pt states is severe    Confusion    Depression    noted in several past MD notes/hosp notes pt to have major depressive disorder - pt denies    Embolism - blood clot in left ovary   Gallstones    Headache    rarely has but currently has one this am    Meningioma ([place]) 08/14/2011   S/p craniotomy Encephaloma present non changing on MRI in 08/2011   Nausea    Palpitations    PONV (postoperative nausea and vomiting)    Rectal bleeding    Rectal pain    Seizures ([place])    last one 2011   TIA (transient ischemic attack)    Unintentional weight loss    Visual disturbance    Weakness     Past Surgical History:  Procedure Laterality Date   BRAIN SURGERY     brain tumor removed  2011   CHOLECYSTECTOMY  12/03/2011   Procedure: LAPAROSCOPIC CHOLECYSTECTOMY WITH INTRAOPERATIVE CHOLANGIOGRAM;  Surgeon: Madilyn Hook, DO;  Location: [facility];  Service: General;  Laterality: N/A;   LAPAROSCOPIC OVARIAN CYSTECTOMY Bilateral 08/24/2014   Procedure: XI ROBOTIC ASSISTED BILATERAL LAPAROSCOPIC OVARIAN CYSTECTOMY RESECTION OF ENDOMETRIAL CYST;  Surgeon: Everitt Amber, MD;  Location: [facility];  Service: Gynecology;  Laterality: Bilateral;   ovarian mass removed  2010   left   Family History:  Family History  Problem Relation Age of Onset   Depression Mother    Heart failure Father    Hypertension  Father    Heart disease Father    Alcohol abuse Sister    Alcohol abuse Brother    Alzheimer's disease Maternal Grandmother    Diabetes Maternal Grandmother    Stroke Maternal Grandmother    Family Psychiatric History: see admission H&P Social History:  Social History   Substance and Sexual Activity  Alcohol Use No     Social History   Substance and Sexual Activity  Drug Use No    Social History   Socioeconomic History   Marital status: Divorced    Spouse name: Not on file   Number of children: Not on file   Years of education: Not on file   Highest education level: Not on file  Occupational History   Not on file  Tobacco Use   Smoking status: Never   Smokeless tobacco: Never  Substance and Sexual Activity   Alcohol use: No   Drug use: No   Sexual activity: Not Currently  Other Topics Concern   Not on file  Social History Narrative   Not on file   Social Determinants of Health   Financial Resource Strain: Not on file  Food Insecurity: Not on file  Transportation Needs: Not on file  Physical Activity: Not on file  Stress: Not on file  Social Connections: Not on file   SDOH:  SDOH Screenings   Alcohol Screen: Not on file  Depression (PHQ2-9): Medium Risk   PHQ-2 Score: 17  Financial Resource Strain: Not on file  Food Insecurity: Not on file  Housing: Not on file  Physical Activity: Not on file  Social Connections: Not on file  Stress: Not on file  Tobacco Use: Low Risk    Smoking Tobacco Use: Never   Smokeless Tobacco Use: Never   Passive Exposure: Not on file  Transportation Needs: Not on file    Tobacco Cessation:  N/A, patient does not currently use tobacco products  Current Medications:  Current Facility-Administered Medications  Medication Dose Route Frequency Provider Last Rate Last Admin   acetaminophen (TYLENOL) tablet 650 mg  650 mg Oral Q6H PRN White, Patrice L, NP       alum & mag hydroxide-simeth (MAALOX/MYLANTA) 200-200-20 MG/5ML  suspension 30 mL  30 mL Oral Q4H PRN White, Patrice L, NP       hydrOXYzine (ATARAX/VISTARIL) tablet 25 mg  25 mg Oral TID PRN White, Patrice L, NP       magnesium hydroxide (MILK OF MAGNESIA) suspension 30 mL  30 mL Oral Daily PRN White, Patrice L, NP       sertraline (ZOLOFT) tablet 25 mg  25 mg Oral Daily White, Patrice L, NP   25 mg at 01/14/21 0912   traZODone (DESYREL) tablet 50 mg  50 mg Oral QHS PRN White, Mellody Life, NP  No current outpatient medications on file.    PTA Medications: (Not in a hospital admission)   Musculoskeletal  Strength & Muscle Tone: within normal limits Gait & Station: normal Patient leans: N/A  Psychiatric Specialty Exam  Presentation  General Appearance: Disheveled; Appropriate for Environment  Eye Contact:Fleeting  Speech:Clear and Coherent; Normal Rate  Speech Volume:Normal  Handedness:Right   Mood and Affect  Mood:Anxious; Depressed  Affect:Congruent   Thought Process  Thought Processes:Coherent  Descriptions of Associations:Intact  Orientation:Full (Time, Place and Person)  Thought Content:Paranoid Ideation; Tangential  Diagnosis of Schizophrenia or Schizoaffective disorder in past: No  Duration of Psychotic Symptoms: Less than six months   Hallucinations:Hallucinations: Auditory Description of Auditory Hallucinations: would not elaborate  Ideas of Reference:Paranoia  Suicidal Thoughts:Suicidal Thoughts: Yes, Active SI Active Intent and/or Plan: Without Intent; With Plan; With Means to [place]  Homicidal Thoughts:Homicidal Thoughts: No   Sensorium  Memory:Immediate Fair; Recent Fair; Remote Fair  Judgment:Impaired  Insight:Lacking   Executive Functions  Concentration:Fair  Attention Span:Fair  [place]   Psychomotor Activity  Psychomotor Activity:Psychomotor Activity: Normal   Assets  Assets:Desire for Improvement; Leisure Time; Physical Health;  Resilience   Sleep  Sleep:Sleep: Fair   No data recorded  Physical Exam  Physical Exam see progress note  ROS see progress note  Blood pressure 124/64, pulse 67, temperature 98.6 F (37 C), temperature source Oral, resp. rate 18, SpO2 98 %. There is no height or weight on file to calculate BMI.  Demographic Factors:  Caucasian, Low socioeconomic status, and Unemployed  Loss Factors: Financial problems/change in socioeconomic status  Historical Factors: Impulsivity  Risk Reduction Factors:   Sense of responsibility to family and Positive coping skills or problem solving skills  Continued Clinical Symptoms:  Severe Anxiety and/or Agitation Depression:   Hopelessness Impulsivity  Cognitive Features That Contribute To Risk:  None    Suicide Risk:  Severe:  Frequent, intense, and enduring suicidal ideation, specific plan, no subjective intent, but some objective markers of intent (i.e., choice of lethal method), the method is accessible, some limited preparatory behavior, evidence of impaired self-control, severe dysphoria/symptomatology, multiple risk factors present, and few if any protective factors, particularly a lack of social support.  Plan Of Care/Follow-up recommendations:  Activity:  as tolerated  Diet:  regular   Disposition:  Discharge patient and transfer to [facility].   Patient meets criteria for inpatient psychiatric admission.  Patient agreed.  [facility] had no available beds.  Social worker was contacted and patient was faxed out.  Patient was accepted to [facility], originally there was no transportation. Social worker contacted [employer] again and was able to arrange. Patient will be transferred today to [facility].   Revonda Humphrey, NP 01/14/2021, 2:55 PM

## 2021-01-14 NOTE — ED Notes (Signed)
Pt c/o pain in back of head 5/10.  Offered prn Tylenol as ordered but at time of witting Pt declined.    Provider notified.

## 2021-01-14 NOTE — ED Notes (Signed)
Pt is awake and sitting up in chair.  She is alert and oriented x4.   Sad flat affect.  When asked if she has any thoughts of self harm she tears up but shakes her head no.  She states " I am just real depressed".   Pt given breakfast tray.  Will continue to observe for safety while awaiting dispo.

## 2021-01-14 NOTE — ED Notes (Signed)
Pt is currently sitting in chair.   Has been accepted to [facility] continue to be flat. She is redirectable and approachable.  Continuing to monitor for safety

## 2021-01-14 NOTE — Progress Notes (Signed)
Report given to [facility], RN , [employer], [place].

## 2021-01-16 ENCOUNTER — Telehealth (HOSPITAL_COMMUNITY): Payer: Self-pay | Admitting: Emergency Medicine

## 2021-01-16 NOTE — BH Assessment (Signed)
Care Management - Follow Up [facility] Discharges   Patient has been placed in an inpatient psychiatric hospital [facility]) on 01-14-2021.

## 2021-02-20 ENCOUNTER — Ambulatory Visit (HOSPITAL_COMMUNITY): Payer: Medicare Other | Admitting: Student in an Organized Health Care Education/Training Program
